# Patient Record
Sex: Male | Born: 1935 | Race: White | Hispanic: No | Marital: Married | Smoking: Never smoker
Health system: Southern US, Community
[De-identification: ages and names within clinical notes are randomized; demographics above are authoritative.]

## PROBLEM LIST (undated history)

## (undated) DIAGNOSIS — R350 Frequency of micturition: Secondary | ICD-10-CM

## (undated) DIAGNOSIS — E785 Hyperlipidemia, unspecified: Secondary | ICD-10-CM

## (undated) DIAGNOSIS — R32 Unspecified urinary incontinence: Secondary | ICD-10-CM

## (undated) DIAGNOSIS — N401 Enlarged prostate with lower urinary tract symptoms: Secondary | ICD-10-CM

## (undated) DIAGNOSIS — I10 Essential (primary) hypertension: Secondary | ICD-10-CM

## (undated) DIAGNOSIS — E669 Obesity, unspecified: Secondary | ICD-10-CM

## (undated) DIAGNOSIS — M1990 Unspecified osteoarthritis, unspecified site: Secondary | ICD-10-CM

## (undated) DIAGNOSIS — K219 Gastro-esophageal reflux disease without esophagitis: Secondary | ICD-10-CM

## (undated) DIAGNOSIS — N138 Other obstructive and reflux uropathy: Secondary | ICD-10-CM

## (undated) DIAGNOSIS — J189 Pneumonia, unspecified organism: Secondary | ICD-10-CM

## (undated) DIAGNOSIS — G4730 Sleep apnea, unspecified: Secondary | ICD-10-CM

## (undated) HISTORY — DX: Gastro-esophageal reflux disease without esophagitis: K21.9

## (undated) HISTORY — DX: Essential (primary) hypertension: I10

## (undated) HISTORY — PX: HERNIA REPAIR: SHX51

## (undated) HISTORY — DX: Unspecified urinary incontinence: R32

## (undated) HISTORY — DX: Hyperlipidemia, unspecified: E78.5

## (undated) HISTORY — DX: Frequency of micturition: R35.0

## (undated) HISTORY — DX: Unspecified osteoarthritis, unspecified site: M19.90

## (undated) HISTORY — DX: Benign prostatic hyperplasia with lower urinary tract symptoms: N40.1

## (undated) HISTORY — DX: Pneumonia, unspecified organism: J18.9

## (undated) HISTORY — DX: Obesity, unspecified: E66.9

## (undated) HISTORY — DX: Other obstructive and reflux uropathy: N13.8

---

## 2003-10-25 ENCOUNTER — Ambulatory Visit (HOSPITAL_COMMUNITY): Admission: RE | Admit: 2003-10-25 | Discharge: 2003-10-25 | Payer: Self-pay | Admitting: Otolaryngology

## 2004-03-06 ENCOUNTER — Ambulatory Visit (HOSPITAL_COMMUNITY): Admission: RE | Admit: 2004-03-06 | Discharge: 2004-03-06 | Payer: Self-pay | Admitting: Otolaryngology

## 2006-02-02 ENCOUNTER — Ambulatory Visit: Payer: Self-pay | Admitting: Gastroenterology

## 2007-10-23 LAB — HM COLONOSCOPY: HM Colonoscopy: NORMAL

## 2014-06-03 DIAGNOSIS — N400 Enlarged prostate without lower urinary tract symptoms: Secondary | ICD-10-CM | POA: Insufficient documentation

## 2014-06-03 DIAGNOSIS — E785 Hyperlipidemia, unspecified: Secondary | ICD-10-CM | POA: Insufficient documentation

## 2014-10-22 ENCOUNTER — Encounter: Payer: Self-pay | Admitting: Internal Medicine

## 2014-10-22 ENCOUNTER — Ambulatory Visit (INDEPENDENT_AMBULATORY_CARE_PROVIDER_SITE_OTHER): Payer: Commercial Managed Care - HMO | Admitting: Internal Medicine

## 2014-10-22 ENCOUNTER — Ambulatory Visit (INDEPENDENT_AMBULATORY_CARE_PROVIDER_SITE_OTHER)
Admission: RE | Admit: 2014-10-22 | Discharge: 2014-10-22 | Disposition: A | Discharge status: Home / Self Care | Payer: Commercial Managed Care - HMO | Source: Ambulatory Visit | Attending: Internal Medicine | Admitting: Internal Medicine

## 2014-10-22 ENCOUNTER — Encounter (INDEPENDENT_AMBULATORY_CARE_PROVIDER_SITE_OTHER): Payer: Self-pay

## 2014-10-22 VITALS — BP 140/62 | HR 68 | Temp 97.6°F | Ht 72.0 in | Wt 229.0 lb

## 2014-10-22 DIAGNOSIS — R059 Cough, unspecified: Secondary | ICD-10-CM | POA: Insufficient documentation

## 2014-10-22 DIAGNOSIS — R42 Dizziness and giddiness: Secondary | ICD-10-CM

## 2014-10-22 DIAGNOSIS — E785 Hyperlipidemia, unspecified: Secondary | ICD-10-CM

## 2014-10-22 DIAGNOSIS — Z1283 Encounter for screening for malignant neoplasm of skin: Secondary | ICD-10-CM

## 2014-10-22 DIAGNOSIS — Z23 Encounter for immunization: Secondary | ICD-10-CM

## 2014-10-22 DIAGNOSIS — R05 Cough: Secondary | ICD-10-CM

## 2014-10-22 LAB — LIPID PANEL
Cholesterol: 226 mg/dL — ABNORMAL HIGH (ref 0–200)
HDL: 47.8 mg/dL (ref >39.00)
LDL Cholesterol: 153 mg/dL — ABNORMAL HIGH (ref 0–99)
NonHDL: 178.2
Total CHOL/HDL Ratio: 5
Triglycerides: 127 mg/dL (ref 0.0–149.0)
VLDL: 25.4 mg/dL (ref 0.0–40.0)

## 2014-10-22 LAB — MICROALBUMIN / CREATININE URINE RATIO
Creatinine,U: 176.3 mg/dL
Microalb Creat Ratio: 0.4 mg/g (ref 0.0–30.0)
Microalb, Ur: 0.7 mg/dL (ref 0.0–1.9)

## 2014-10-22 LAB — CBC WITH DIFFERENTIAL/PLATELET
Basophils Absolute: 0 10*3/uL (ref 0.0–0.1)
Basophils Relative: 0.4 % (ref 0.0–3.0)
Eosinophils Absolute: 0.1 10*3/uL (ref 0.0–0.7)
Eosinophils Relative: 2 % (ref 0.0–5.0)
HCT: 44.2 % (ref 39.0–52.0)
Hemoglobin: 14.6 g/dL (ref 13.0–17.0)
Lymphocytes Relative: 30.9 % (ref 12.0–46.0)
Lymphs Abs: 1.6 10*3/uL (ref 0.7–4.0)
MCHC: 33.1 g/dL (ref 30.0–36.0)
MCV: 94.3 fl (ref 78.0–100.0)
Monocytes Absolute: 0.6 10*3/uL (ref 0.1–1.0)
Monocytes Relative: 11.1 % (ref 3.0–12.0)
Neutro Abs: 2.8 10*3/uL (ref 1.4–7.7)
Neutrophils Relative %: 55.6 % (ref 43.0–77.0)
Platelets: 194 10*3/uL (ref 150.0–400.0)
RBC: 4.68 Mil/uL (ref 4.22–5.81)
RDW: 13.3 % (ref 11.5–15.5)
WBC: 5.1 10*3/uL (ref 4.0–10.5)

## 2014-10-22 LAB — COMPREHENSIVE METABOLIC PANEL
ALT: 21 U/L (ref 0–53)
AST: 24 U/L (ref 0–37)
Albumin: 3.5 g/dL (ref 3.5–5.2)
Alkaline Phosphatase: 68 U/L (ref 39–117)
BUN: 18 mg/dL (ref 6–23)
CO2: 25 mEq/L (ref 19–32)
Calcium: 8.8 mg/dL (ref 8.4–10.5)
Chloride: 106 mEq/L (ref 96–112)
Creatinine, Ser: 0.9 mg/dL (ref 0.4–1.5)
GFR: 82.8 mL/min (ref >60.00)
Glucose, Bld: 88 mg/dL (ref 70–99)
Potassium: 4.2 mEq/L (ref 3.5–5.1)
Sodium: 138 mEq/L (ref 135–145)
Total Bilirubin: 0.9 mg/dL (ref 0.2–1.2)
Total Protein: 6.6 g/dL (ref 6.0–8.3)

## 2014-10-22 LAB — TSH: TSH: 1.64 u[IU]/mL (ref 0.35–4.50)

## 2014-10-22 LAB — VITAMIN B12: Vitamin B-12: 1182 pg/mL — ABNORMAL HIGH (ref 211–911)

## 2014-10-22 NOTE — Assessment & Plan Note (Signed)
Recent dry cough noted by wife. Exam normal today. Will get CXR for further evaluation.

## 2014-10-22 NOTE — Progress Notes (Signed)
Pre visit review using our clinic review tool, if applicable. No additional management support is needed unless otherwise documented below in the visit note. 

## 2014-10-22 NOTE — Assessment & Plan Note (Signed)
Recent episodes of lightheadedness. Exam normal today. Will check CBC, CMP, TSH, B12, carotid doppler. Will request notes from his previous physician. EKG later this week when EPIC interface available.

## 2014-10-22 NOTE — Progress Notes (Signed)
Subjective:    Patient ID: Brian Pearson, male    DOB: [birth year], 78 y.o.   MRN: [id number]  HPI 78YO male presents to establish care.  Lightheaded - Over last several weeks. Occurs mostly with exercise. No chest pain or dyspnea. No syncopal episodes. Also occasionally occurs with change in position. Denies vertigo. No focal neurologic symptoms. No recent changes to medications.  Notes decreased energy level. No change in appetite, BM.  Told by previous physician he had a goiter. No previous work up for this.  Wife also notes him having dry cough frequently. He denies shortness of breath, chest pain. He was hospitalized in distant past for pneumonia.  BP running near 130/60. Has not been taking any medication for BP.  Reports he was taken off his cholesterol medication, as cholesterol was low.  Review of Systems  Constitutional: Positive for fatigue. Negative for fever, chills, activity change, appetite change and unexpected weight change.  HENT: Negative for sore throat and trouble swallowing.   Eyes: Negative for visual disturbance.  Respiratory: Negative for cough, shortness of breath and wheezing.   Cardiovascular: Negative for chest pain, palpitations and leg swelling.  Gastrointestinal: Negative for nausea, vomiting, abdominal pain, diarrhea, constipation, blood in stool and abdominal distention.  Genitourinary: Negative for dysuria, urgency and difficulty urinating.  Musculoskeletal: Negative for myalgias, arthralgias and gait problem.  Skin: Negative for color change and rash.  Neurological: Positive for dizziness and light-headedness. Negative for tremors, seizures, syncope, facial asymmetry, weakness, numbness and headaches.  Hematological: Negative for adenopathy.  Psychiatric/Behavioral: Negative for sleep disturbance and dysphoric mood. The patient is not nervous/anxious.        Objective:    BP 140/62 mmHg  Pulse 68  Temp(Src) 97.6 F (36.4 C) (Oral)  Ht  6' (1.829 m)  Wt 229 lb (103.874 kg)  BMI 31.05 kg/m2  SpO2 95% Physical Exam  Constitutional: He is oriented to person, place, and time. He appears well-developed and well-nourished. No distress.  HENT:  Head: Normocephalic and atraumatic.  Right Ear: External ear normal.  Left Ear: External ear normal.  Nose: Nose normal.  Mouth/Throat: Oropharynx is clear and moist. No oropharyngeal exudate.  Eyes: Conjunctivae and EOM are normal. Pupils are equal, round, and reactive to light. Right eye exhibits no discharge. Left eye exhibits no discharge. No scleral icterus.  Neck: Normal range of motion. Neck supple. No tracheal deviation present. No thyromegaly present.  Cardiovascular: Normal rate, regular rhythm and normal heart sounds.  Exam reveals no gallop and no friction rub.   No murmur heard. Pulmonary/Chest: Effort normal and breath sounds normal. No accessory muscle usage. No tachypnea. No respiratory distress. He has no decreased breath sounds. He has no wheezes. He has no rhonchi. He has no rales. He exhibits no tenderness.  Abdominal: Soft. Bowel sounds are normal. He exhibits no distension and no mass. There is no tenderness. There is no rebound and no guarding.  Musculoskeletal: Normal range of motion. He exhibits no edema.  Lymphadenopathy:    He has no cervical adenopathy.  Neurological: He is alert and oriented to person, place, and time. No cranial nerve deficit. Coordination normal.  Skin: Skin is warm and dry. No rash noted. He is not diaphoretic. No erythema. No pallor.  Psychiatric: He has a normal mood and affect. His behavior is normal. Judgment and thought content normal.          Assessment & Plan:   Problem List Items Addressed This Visit  Unprioritized   Cough    Recent dry cough noted by wife. Exam normal today. Will get CXR for further evaluation.    Relevant Orders      DG Chest 2 View   Hyperlipidemia    Will recheck lipids and LFTs with labs  today.    Relevant Medications      aspirin EC 81 MG tablet      terazosin (HYTRIN) 1 MG capsule   Other Relevant Orders      Lipid panel      Microalbumin / creatinine urine ratio   Lightheaded - Primary    Recent episodes of lightheadedness. Exam normal today. Will check CBC, CMP, TSH, B12, carotid doppler. Will request notes from his previous physician. EKG later this week when EPIC interface available.    Relevant Orders      CBC with Differential      Comprehensive metabolic panel      TSH      B12      US Carotid Duplex Bilateral   Screening for skin cancer    Referral placed for general skin exam with Dr. Phillip Heal.    Relevant Orders      Ambulatory referral to Dermatology    Other Visit Diagnoses    Need for vaccination with 13-polyvalent pneumococcal conjugate vaccine        Relevant Orders       Pneumococcal conjugate vaccine 13-valent (Completed)        Return in about 2 weeks (around 11/05/2014).

## 2014-10-22 NOTE — Patient Instructions (Signed)
We will check lab work today to help determine why you have been feeling lightheaded.  We will also get a chest xray today at our [facility] office.  We will set up an evaluation with carotid dopplers at the hospital.  Please follow up here in 2 weeks.

## 2014-10-22 NOTE — Assessment & Plan Note (Signed)
Referral placed for general skin exam with Dr. Phillip Heal.

## 2014-10-22 NOTE — Assessment & Plan Note (Signed)
Will recheck lipids and LFTs with labs today.

## 2014-10-24 ENCOUNTER — Ambulatory Visit: Payer: Self-pay | Admitting: Internal Medicine

## 2014-10-28 ENCOUNTER — Telehealth: Payer: Self-pay | Admitting: Internal Medicine

## 2014-10-28 NOTE — Telephone Encounter (Signed)
Notified pt.  When should we have pt come in for EKG?

## 2014-10-28 NOTE — Telephone Encounter (Signed)
Any time is fine.

## 2014-10-28 NOTE — Telephone Encounter (Signed)
Carotid doppler showed no significant stenosis.

## 2014-10-29 NOTE — Telephone Encounter (Signed)
appt scheduled

## 2014-10-30 ENCOUNTER — Ambulatory Visit (INDEPENDENT_AMBULATORY_CARE_PROVIDER_SITE_OTHER): Payer: Commercial Managed Care - HMO | Admitting: *Deleted

## 2014-10-30 DIAGNOSIS — R42 Dizziness and giddiness: Secondary | ICD-10-CM

## 2014-10-30 NOTE — Progress Notes (Signed)
Pt presents for EKG, was unable to perform at [place] last week due to technical difficulties. Pt having episodes of lightheadedness, no changes from when he saw Dr. Gilford Rile last week. No new symptoms. Performed EKG without difficulties, pt tolerated well. Dr. Gilford Rile made aware of EKG prior to patient leaving.

## 2014-11-21 ENCOUNTER — Encounter: Payer: Self-pay | Admitting: Internal Medicine

## 2014-11-21 ENCOUNTER — Ambulatory Visit (INDEPENDENT_AMBULATORY_CARE_PROVIDER_SITE_OTHER): Payer: Commercial Managed Care - HMO | Admitting: Internal Medicine

## 2014-11-21 VITALS — BP 149/78 | HR 74 | Temp 97.6°F | Ht 72.0 in | Wt 231.0 lb

## 2014-11-21 DIAGNOSIS — R42 Dizziness and giddiness: Secondary | ICD-10-CM

## 2014-11-21 DIAGNOSIS — E785 Hyperlipidemia, unspecified: Secondary | ICD-10-CM

## 2014-11-21 DIAGNOSIS — R05 Cough: Secondary | ICD-10-CM

## 2014-11-21 DIAGNOSIS — R059 Cough, unspecified: Secondary | ICD-10-CM

## 2014-11-21 MED ORDER — SIMVASTATIN 20 MG PO TABS: 20.0000 mg | ORAL_TABLET | Freq: Every day | ORAL | Status: DC

## 2014-11-21 NOTE — Assessment & Plan Note (Signed)
Symptoms have improved. Carotid dopplers were normal. Suspect that seasonal allergies and middle ear effusion contributing to some earlier symptoms. Will monitor.

## 2014-11-21 NOTE — Assessment & Plan Note (Signed)
Chronic cough likely related to seasonal allergies. CXR was normal. Will continue Allegra. We discussed adding nasal steroid and/or Singulair, however he prefers to hold off for now. We also discussed potential referral to pulmonary medicine for evaluation, but he prefers to hold off for now. Follow up as needed.

## 2014-11-21 NOTE — Progress Notes (Signed)
Subjective:    Patient ID: Brian Pearson, male    DOB: [birth year], 78 y.o.   MRN: [id number]  HPI 78YO male presents for follow up.  Recently seen as new pt and complained of lightheadedness and cough. Workup including labs were normal. Carotid dopplers were normal. CXR was normal.  Started on Allegra and symptoms have improved.  Continues to have some cough every day. Occasionally productive of phlegm. No dyspnea. No fever, chills.  BP Readings from Last 3 Encounters:  11/21/14 149/78  10/22/14 140/62   BP at home has been near 702O systolic. No chest pain, headache, palpitations noted.  Review of Systems  Constitutional: Negative for fever, chills, activity change, appetite change, fatigue and unexpected weight change.  Eyes: Negative for visual disturbance.  Respiratory: Positive for cough. Negative for shortness of breath.   Cardiovascular: Negative for chest pain, palpitations and leg swelling.  Gastrointestinal: Negative for nausea, abdominal pain, diarrhea, constipation and abdominal distention.  Genitourinary: Negative for dysuria, urgency and difficulty urinating.  Musculoskeletal: Negative for arthralgias and gait problem.  Skin: Negative for color change and rash.  Neurological: Negative for light-headedness.  Hematological: Negative for adenopathy.  Psychiatric/Behavioral: Negative for sleep disturbance and dysphoric mood. The patient is not nervous/anxious.        Objective:    BP 149/78 mmHg  Pulse 74  Temp(Src) 97.6 F (36.4 C) (Oral)  Ht 6' (1.829 m)  Wt 231 lb (104.781 kg)  BMI 31.32 kg/m2  SpO2 95% Physical Exam  Constitutional: He is oriented to person, place, and time. He appears well-developed and well-nourished. No distress.  HENT:  Head: Normocephalic and atraumatic.  Right Ear: External ear normal.  Left Ear: External ear normal.  Nose: Nose normal.  Mouth/Throat: Oropharynx is clear and moist. No oropharyngeal exudate.  Eyes:  Conjunctivae and EOM are normal. Pupils are equal, round, and reactive to light. Right eye exhibits no discharge. Left eye exhibits no discharge. No scleral icterus.  Neck: Normal range of motion. Neck supple. No tracheal deviation present. No thyromegaly present.  Cardiovascular: Normal rate, regular rhythm and normal heart sounds.  Exam reveals no gallop and no friction rub.   No murmur heard. Pulmonary/Chest: Effort normal and breath sounds normal. No accessory muscle usage. No tachypnea. No respiratory distress. He has no decreased breath sounds. He has no wheezes. He has no rhonchi. He has no rales. He exhibits no tenderness.  Musculoskeletal: Normal range of motion. He exhibits no edema.  Lymphadenopathy:    He has no cervical adenopathy.  Neurological: He is alert and oriented to person, place, and time. No cranial nerve deficit. Coordination normal.  Skin: Skin is warm and dry. No rash noted. He is not diaphoretic. No erythema. No pallor.  Psychiatric: He has a normal mood and affect. His behavior is normal. Judgment and thought content normal.          Assessment & Plan:   Problem List Items Addressed This Visit      Unprioritized   Cough    Chronic cough likely related to seasonal allergies. CXR was normal. Will continue Allegra. We discussed adding nasal steroid and/or Singulair, however he prefers to hold off for now. We also discussed potential referral to pulmonary medicine for evaluation, but he prefers to hold off for now. Follow up as needed.    Hyperlipidemia    We reviewed recent lipids and calculated 10 year risk of CAD which is 22%. Will restart Simvastatin. Recheck lipids and LFTs in  3 months.    Relevant Medications      simvastatin (ZOCOR) tablet   Lightheaded - Primary    Symptoms have improved. Carotid dopplers were normal. Suspect that seasonal allergies and middle ear effusion contributing to some earlier symptoms. Will monitor.        Return in about  3 months (around 02/20/2015) for Recheck.

## 2014-11-21 NOTE — Progress Notes (Signed)
Pre visit review using our clinic review tool, if applicable. No additional management support is needed unless otherwise documented below in the visit note. 

## 2014-11-21 NOTE — Assessment & Plan Note (Signed)
We reviewed recent lipids and calculated 10 year risk of CAD which is 22%. Will restart Simvastatin. Recheck lipids and LFTs in 3 months.

## 2014-11-21 NOTE — Patient Instructions (Signed)
Start back on Simvastatin 20mg  daily.  Recheck labs in 3 months.

## 2014-11-22 ENCOUNTER — Encounter: Payer: Self-pay | Admitting: Internal Medicine

## 2014-12-03 ENCOUNTER — Encounter: Payer: Self-pay | Admitting: Internal Medicine

## 2015-02-21 ENCOUNTER — Encounter: Payer: Self-pay | Admitting: Internal Medicine

## 2015-02-21 ENCOUNTER — Encounter: Payer: Self-pay | Admitting: *Deleted

## 2015-02-21 ENCOUNTER — Ambulatory Visit (INDEPENDENT_AMBULATORY_CARE_PROVIDER_SITE_OTHER): Payer: Commercial Managed Care - HMO | Admitting: Internal Medicine

## 2015-02-21 VITALS — BP 133/73 | HR 73 | Temp 97.5°F | Ht 72.0 in | Wt 230.0 lb

## 2015-02-21 DIAGNOSIS — R3911 Hesitancy of micturition: Secondary | ICD-10-CM | POA: Insufficient documentation

## 2015-02-21 DIAGNOSIS — M25559 Pain in unspecified hip: Secondary | ICD-10-CM

## 2015-02-21 DIAGNOSIS — E785 Hyperlipidemia, unspecified: Secondary | ICD-10-CM

## 2015-02-21 LAB — POCT URINALYSIS DIPSTICK
Bilirubin, UA: NEGATIVE
Blood, UA: NEGATIVE
Glucose, UA: NEGATIVE
Ketones, UA: NEGATIVE
Leukocytes, UA: NEGATIVE
Nitrite, UA: NEGATIVE
Protein, UA: NEGATIVE
Spec Grav, UA: 1.015
Urobilinogen, UA: 0.2
pH, UA: 6.5

## 2015-02-21 LAB — COMPREHENSIVE METABOLIC PANEL
ALT: 19 U/L (ref 0–53)
AST: 21 U/L (ref 0–37)
Albumin: 4.5 g/dL (ref 3.5–5.2)
Alkaline Phosphatase: 80 U/L (ref 39–117)
BUN: 16 mg/dL (ref 6–23)
CO2: 28 mEq/L (ref 19–32)
Calcium: 9.4 mg/dL (ref 8.4–10.5)
Chloride: 106 mEq/L (ref 96–112)
Creatinine, Ser: 1.03 mg/dL (ref 0.40–1.50)
GFR: 74.44 mL/min (ref >60.00)
Glucose, Bld: 103 mg/dL — ABNORMAL HIGH (ref 70–99)
Potassium: 4.4 mEq/L (ref 3.5–5.1)
Sodium: 139 mEq/L (ref 135–145)
Total Bilirubin: 0.9 mg/dL (ref 0.2–1.2)
Total Protein: 6.9 g/dL (ref 6.0–8.3)

## 2015-02-21 LAB — LIPID PANEL
Cholesterol: 143 mg/dL (ref 0–200)
HDL: 49.9 mg/dL (ref >39.00)
LDL Cholesterol: 63 mg/dL (ref 0–99)
NonHDL: 93.1
Total CHOL/HDL Ratio: 3
Triglycerides: 153 mg/dL — ABNORMAL HIGH (ref 0.0–149.0)
VLDL: 30.6 mg/dL (ref 0.0–40.0)

## 2015-02-21 LAB — PSA, MEDICARE: PSA: 2.5 ng/ml (ref 0.10–4.00)

## 2015-02-21 NOTE — Patient Instructions (Addendum)
Labs today.  Consider starting core exercises to help with low back pain, such as performing a plank.  Follow up in 6 months for Wellness Visit or sooner as needed.

## 2015-02-21 NOTE — Assessment & Plan Note (Signed)
Long history of urinary frequency and hesitancy. Diagnosed in past with BPH and started on Terazosin. No recent PSA on chart from previous PCP. Will recheck PSA with labs. Will also set up urology evaluation. With ongoing symptoms and history of prostate cancer in his father, question if [place] might be helpful.

## 2015-02-21 NOTE — Assessment & Plan Note (Addendum)
Mild aching pain in posterior thighs after prolonged sitting. Discussed potential causes. Recommended frequent standing, moving to help control pain. Discussed referral to PT, however he declines for now. Discussed core exercises. Follow up if symptoms are not improving.

## 2015-02-21 NOTE — Progress Notes (Signed)
Pre visit review using our clinic review tool, if applicable. No additional management support is needed unless otherwise documented below in the visit note. 

## 2015-02-21 NOTE — Assessment & Plan Note (Signed)
Recently started back on Simvastatin. Will recheck lipids and LFTs with labs today.

## 2015-02-21 NOTE — Progress Notes (Signed)
Subjective:    Patient ID: Brian Pearson, male    DOB: 04-02-1936, 79 y.o.   MRN: [id number]  HPI  79YO male presents for follow up.  Last seen 12/3 for lightheadedness. Evaluation was normal including carotid dopplers. Symptoms had improved at the time of that visit.  Thigh pain - Some posterior thigh pain over last few weeks with prolonged sitting. Described as aching. Improves with standing and moving around. No swelling in legs. No weakness. Some lower back pain, aching, off and on for years. Occasionally takes Tylenol with improvement.  Urinary frequency - waking 1-3 times per night to urinate. Some decreased urine stream. This has been worsening over the last year. Previously placed on Terazosin for enlarged prostate. No pain with urination or blood in urine.  Exercising about 24min per day on bike. Follows healthy diet.  Wt Readings from Last 3 Encounters:  02/21/15 230 lb (104.327 kg)  11/21/14 231 lb (104.781 kg)  10/22/14 229 lb (103.874 kg)     Past medical, surgical, family and social history per today's encounter.  Review of Systems  Constitutional: Negative for fever, chills, activity change, appetite change, fatigue and unexpected weight change.  Eyes: Negative for visual disturbance.  Respiratory: Negative for cough and shortness of breath.   Cardiovascular: Negative for chest pain, palpitations and leg swelling.  Gastrointestinal: Negative for abdominal pain and abdominal distention.  Genitourinary: Positive for frequency and difficulty urinating (decreased stream). Negative for dysuria, urgency, hematuria, flank pain, penile swelling, scrotal swelling, penile pain and testicular pain.  Musculoskeletal: Positive for myalgias (posterior thighs, intermittent) and back pain (low back pain occasional). Negative for arthralgias and gait problem.  Skin: Negative for color change and rash.  Hematological: Negative for adenopathy.  Psychiatric/Behavioral: Negative  for sleep disturbance and dysphoric mood. The patient is not nervous/anxious.        Objective:    BP 133/73 mmHg  Pulse 73  Temp(Src) 97.5 F (36.4 C) (Oral)  Ht 6' (1.829 m)  Wt 230 lb (104.327 kg)  BMI 31.19 kg/m2  SpO2 96% Physical Exam  Constitutional: He is oriented to person, place, and time. He appears well-developed and well-nourished. No distress.  HENT:  Head: Normocephalic and atraumatic.  Right Ear: External ear normal.  Left Ear: External ear normal.  Nose: Nose normal.  Mouth/Throat: Oropharynx is clear and moist. No oropharyngeal exudate.  Eyes: Conjunctivae and EOM are normal. Pupils are equal, round, and reactive to light. Right eye exhibits no discharge. Left eye exhibits no discharge. No scleral icterus.  Neck: Normal range of motion. Neck supple. No tracheal deviation present. No thyromegaly present.  Cardiovascular: Normal rate, regular rhythm and normal heart sounds.  Exam reveals no gallop and no friction rub.   No murmur heard. Pulmonary/Chest: Effort normal and breath sounds normal. No accessory muscle usage. No tachypnea. No respiratory distress. He has no decreased breath sounds. He has no wheezes. He has no rhonchi. He has no rales. He exhibits no tenderness.  Musculoskeletal: Normal range of motion. He exhibits no edema.       Lumbar back: He exhibits normal range of motion, no tenderness and no pain.  Lymphadenopathy:    He has no cervical adenopathy.  Neurological: He is alert and oriented to person, place, and time. No cranial nerve deficit. Coordination normal.  Skin: Skin is warm and dry. No rash noted. He is not diaphoretic. No erythema. No pallor.  Psychiatric: He has a normal mood and affect. His behavior is  normal. Judgment and thought content normal.          Assessment & Plan:   Problem List Items Addressed This Visit      Unprioritized   Hyperlipidemia    Recently started back on Simvastatin. Will recheck lipids and LFTs with  labs today.      Relevant Orders   Comprehensive metabolic panel   Lipid panel   Pain in joint, pelvic region and thigh - Primary    Mild aching pain in posterior thighs after prolonged sitting. Discussed potential causes. Recommended frequent standing, moving to help control pain. Discussed referral to PT, however he declines for now. Discussed core exercises. Follow up if symptoms are not improving.      Urinary hesitancy    Long history of urinary frequency and hesitancy. Diagnosed in past with BPH and started on Terazosin. No recent PSA on chart from previous PCP. Will recheck PSA with labs. Will also set up urology evaluation. With ongoing symptoms and history of prostate cancer in his father, question if [place] might be helpful.      Relevant Orders   PSA, Medicare   POCT Urinalysis Dipstick   Ambulatory referral to Urology       Return in about 6 months (around 08/24/2015) for Wellness Visit.

## 2015-03-11 ENCOUNTER — Telehealth: Payer: Self-pay | Admitting: Internal Medicine

## 2015-03-11 NOTE — Telephone Encounter (Signed)
Lab sent to [facility]

## 2015-03-11 NOTE — Telephone Encounter (Signed)
[facility] Pearson [facility] call she need labs on patient, psa results,  Phone # [phone]  Fax # [phone]

## 2015-08-28 ENCOUNTER — Ambulatory Visit (INDEPENDENT_AMBULATORY_CARE_PROVIDER_SITE_OTHER): Payer: Commercial Managed Care - HMO

## 2015-08-28 VITALS — BP 130/72 | HR 73 | Temp 98.1°F | Resp 14 | Ht 73.0 in | Wt 233.0 lb

## 2015-08-28 DIAGNOSIS — Z0000 Encounter for general adult medical examination without abnormal findings: Secondary | ICD-10-CM | POA: Diagnosis not present

## 2015-08-28 DIAGNOSIS — Z23 Encounter for immunization: Secondary | ICD-10-CM

## 2015-08-28 DIAGNOSIS — Z299 Encounter for prophylactic measures, unspecified: Secondary | ICD-10-CM

## 2015-08-28 NOTE — Patient Instructions (Signed)
Brian Pearson,  Thank you for taking time to come for your Medicare Wellness Visit.  I appreciate your ongoing commitment to your health goals. Please review the following plan we discussed and let me know if I can assist you in the future.  Bring a copy of HCPOA/Living Will to be scanned into chart.  TDAP (tetanus with pertussis) administered today.  High dose influenza vaccine (flu shot) administered today.

## 2015-08-28 NOTE — Progress Notes (Signed)
Subjective:   Brian Pearson is a 79 y.o. male who presents for Medicare Annual/Subsequent preventive examination.  Review of Systems:  No ROS.  Medicare Wellness Visit.  Cardiac Risk Factors include: advanced age (>76men, >32 women);male gender     Objective:    The goal of the wellness visit is to assist the patient how to close the gaps in care and create a preventative care plan for the patient. This was a routine visit for Brian Pearson.  Vitals: BP 130/72 mmHg  Pulse 73  Temp(Src) 98.1 F (36.7 C) (Oral)  Resp 14  Ht 6\' 1"  (1.854 m)  Wt 233 lb (105.688 kg)  BMI 30.75 kg/m2  SpO2 96%  Tobacco History  Smoking status  . Never Smoker   Smokeless tobacco  . Not on file     Counseling given: Not Answered   Past Medical History  Diagnosis Date  . Arthritis   . GERD (gastroesophageal reflux disease)   . Hypertension   . Urine incontinence   . Hyperlipidemia     taken off simvastatin last year  . Pneumonia     hospitalized    Past Surgical History  Procedure Laterality Date  . Hernia repair      59's - 3 surgeries   Family History  Problem Relation Age of Onset  . Arthritis Mother   . Heart disease Mother     CHF  . Cancer Father     Prostate  . Dementia Father    History  Sexual Activity  . Sexual Activity: Yes    Outpatient Encounter Prescriptions as of 08/28/2015  Medication Sig  . finasteride (PROSCAR) 5 MG tablet Take 5 mg by mouth daily.  . tamsulosin (FLOMAX) 0.4 MG CAPS capsule Take 0.4 mg by mouth.  Marland Kitchen aspirin EC 81 MG tablet Take by mouth.  . Coenzyme Q10 (COQ10 PO) Take by mouth.  . Cyanocobalamin (B-12 PO) Take by mouth.  . Multiple Vitamin (MULTI-VITAMINS) TABS Take by mouth.  . Omega-3 Fatty Acids (FISH OIL) 1000 MG CAPS Take by mouth.  . simvastatin (ZOCOR) 20 MG tablet Take 1 tablet (20 mg total) by mouth at bedtime.  . [DISCONTINUED] terazosin (HYTRIN) 1 MG capsule TAKE 2 CAPSULES EVERY MORNING   No facility-administered  encounter medications on file as of 08/28/2015.    Activities of Daily Living In your present state of health, do you have any difficulty performing the following activities: 08/28/2015  Hearing? Y  Vision? N  Difficulty concentrating or making decisions? N  Walking or climbing stairs? N  Dressing or bathing? N  Doing errands, shopping? N  Preparing Food and eating ? N  Using the Toilet? N  In the past six months, have you accidently leaked urine? N  Do you have problems with loss of bowel control? N  Managing your Medications? N  Managing your Finances? N  Housekeeping or managing your Housekeeping? N    Patient Care Team: Jackolyn Confer, MD as PCP - General (Internal Medicine)   Assessment:     Risk for Osteoporosis reviewed  Taking meds without issues; no barriers identified.  C/O bladder urgency.  Denies pain. Followed by [facility], Dr. Zara Council. Currently taking Finasteride 5mg , Tamsulosin 0.4mg .  Safety issues reviewed; smoke detectors in the home. No firearms in the home. Wears seatbelts when driving or riding with others. No violence in the home.  The patient was oriented x 3; appropriate in dress and manner and no objective failures at  ADL's or IADL's.   Ophthalmologist- Followed by [facility].Last visit June 2016.   Bilateral hearing Aids-Followed by [facility] visit 2 years ago.  No complaints. Passes whisper test.  End of life planning was discussed; Advanced care planning discussed; plans to return copy of HCPOA and Living Will.  High dose Influenza administered today.  TDAP administered today.  ZOSTAVAX reported as done by the patient.  Record updated.  Patient reported a feeling of pull behind left thigh when legs are raised in recliner and while driving.  Denies pain. Deferred to follow up with PCP at 3 mo follow up or sooner if needed.  Exercise Activities and Dietary recommendations Current  Exercise Habits:: Home exercise routine (Uses stretch bands and rides bike), Time (Minutes): 30, Frequency (Times/Week): 4, Weekly Exercise (Minutes/Week): 120, Intensity: Mild  Goals    . Increase physical activity     Incorporate walking with wife 30 minutes up to 4 days a week.  Drink more water. Currently drinking 1 (16oz)  bottle per day.  Will try to drink up to 4 bottles per day.      Fall Risk Fall Risk  08/28/2015 02/21/2015  Falls in the past year? No No   Depression Screen PHQ 2/9 Scores 08/28/2015 02/21/2015  PHQ - 2 Score 0 0    Cognitive Testing MMSE - Mini Mental State Exam 08/28/2015  Orientation to time 5  Orientation to Place 5  Registration 3  Attention/ Calculation 5  Recall 3  Language- name 2 objects 2  Language- repeat 1  Language- follow 3 step command 3  Language- read & follow direction 1  Write a sentence 1  Copy design 1  Total score 30    Immunization History  Administered Date(s) Administered  . Influenza, High Dose Seasonal PF 08/28/2015  . Influenza-Unspecified 09/21/2014  . Pneumococcal Conjugate-13 10/22/2014  . Pneumococcal-Unspecified 10/23/2011  . Tdap 08/28/2015   Screening Tests Health Maintenance  Topic Date Due  . ZOSTAVAX  03/16/1998  . INFLUENZA VACCINE  07/20/2016  . COLONOSCOPY  10/22/2017  . TETANUS/TDAP  08/27/2025  . PNA vac Low Risk Adult  Completed      Plan:    During the course of the visit the patient was educated and counseled about the following appropriate screening and preventive services:   Vaccines to include Pneumoccal, Influenza, Hepatitis B, Td, Zostavax, HCV  Electrocardiogram  Cardiovascular Disease  Colorectal cancer screening  Diabetes screening  Prostate Cancer Screening  Glaucoma screening  Nutrition counseling   Smoking cessation counseling  Patient Instructions (the written plan) was given to the patient.    Varney Biles, LPN  0/05/44

## 2015-08-28 NOTE — Progress Notes (Signed)
Annual Wellness Visit as completed by Health Coach was reviewed in full.  

## 2015-11-27 ENCOUNTER — Ambulatory Visit: Payer: Commercial Managed Care - HMO | Admitting: Internal Medicine

## 2015-12-12 ENCOUNTER — Ambulatory Visit: Payer: Commercial Managed Care - HMO | Admitting: Internal Medicine

## 2015-12-30 DIAGNOSIS — L570 Actinic keratosis: Secondary | ICD-10-CM | POA: Diagnosis not present

## 2015-12-30 DIAGNOSIS — Z872 Personal history of diseases of the skin and subcutaneous tissue: Secondary | ICD-10-CM | POA: Diagnosis not present

## 2015-12-30 DIAGNOSIS — Z1283 Encounter for screening for malignant neoplasm of skin: Secondary | ICD-10-CM | POA: Diagnosis not present

## 2016-01-02 ENCOUNTER — Ambulatory Visit (INDEPENDENT_AMBULATORY_CARE_PROVIDER_SITE_OTHER): Payer: PPO | Admitting: Internal Medicine

## 2016-01-02 ENCOUNTER — Encounter: Payer: Self-pay | Admitting: Internal Medicine

## 2016-01-02 VITALS — BP 180/82 | HR 61 | Temp 97.5°F | Ht 73.0 in | Wt 225.2 lb

## 2016-01-02 DIAGNOSIS — R1310 Dysphagia, unspecified: Secondary | ICD-10-CM

## 2016-01-02 DIAGNOSIS — IMO0001 Reserved for inherently not codable concepts without codable children: Secondary | ICD-10-CM

## 2016-01-02 DIAGNOSIS — R42 Dizziness and giddiness: Secondary | ICD-10-CM | POA: Insufficient documentation

## 2016-01-02 DIAGNOSIS — R030 Elevated blood-pressure reading, without diagnosis of hypertension: Secondary | ICD-10-CM

## 2016-01-02 DIAGNOSIS — E785 Hyperlipidemia, unspecified: Secondary | ICD-10-CM

## 2016-01-02 DIAGNOSIS — R252 Cramp and spasm: Secondary | ICD-10-CM

## 2016-01-02 DIAGNOSIS — I10 Essential (primary) hypertension: Secondary | ICD-10-CM | POA: Insufficient documentation

## 2016-01-02 LAB — COMPREHENSIVE METABOLIC PANEL
ALT: 19 U/L (ref 0–53)
AST: 22 U/L (ref 0–37)
Albumin: 4.3 g/dL (ref 3.5–5.2)
Alkaline Phosphatase: 70 U/L (ref 39–117)
BUN: 16 mg/dL (ref 6–23)
CO2: 28 mEq/L (ref 19–32)
Calcium: 9.3 mg/dL (ref 8.4–10.5)
Chloride: 105 mEq/L (ref 96–112)
Creatinine, Ser: 0.93 mg/dL (ref 0.40–1.50)
GFR: 83.56 mL/min (ref >60.00)
Glucose, Bld: 93 mg/dL (ref 70–99)
Potassium: 4.3 mEq/L (ref 3.5–5.1)
Sodium: 139 mEq/L (ref 135–145)
Total Bilirubin: 0.9 mg/dL (ref 0.2–1.2)
Total Protein: 6.7 g/dL (ref 6.0–8.3)

## 2016-01-02 LAB — LIPID PANEL
Cholesterol: 142 mg/dL (ref 0–200)
HDL: 55 mg/dL (ref >39.00)
LDL Cholesterol: 62 mg/dL (ref 0–99)
NonHDL: 86.86
Total CHOL/HDL Ratio: 3
Triglycerides: 123 mg/dL (ref 0.0–149.0)
VLDL: 24.6 mg/dL (ref 0.0–40.0)

## 2016-01-02 LAB — VITAMIN B12: Vitamin B-12: 1198 pg/mL — ABNORMAL HIGH (ref 211–911)

## 2016-01-02 LAB — CBC WITH DIFFERENTIAL/PLATELET
Basophils Absolute: 0 10*3/uL (ref 0.0–0.1)
Basophils Relative: 0.5 % (ref 0.0–3.0)
Eosinophils Absolute: 0.1 10*3/uL (ref 0.0–0.7)
Eosinophils Relative: 1.8 % (ref 0.0–5.0)
HCT: 45.3 % (ref 39.0–52.0)
Hemoglobin: 14.8 g/dL (ref 13.0–17.0)
Lymphocytes Relative: 34 % (ref 12.0–46.0)
Lymphs Abs: 1.9 10*3/uL (ref 0.7–4.0)
MCHC: 32.6 g/dL (ref 30.0–36.0)
MCV: 94.9 fl (ref 78.0–100.0)
Monocytes Absolute: 0.6 10*3/uL (ref 0.1–1.0)
Monocytes Relative: 11.2 % (ref 3.0–12.0)
Neutro Abs: 3 10*3/uL (ref 1.4–7.7)
Neutrophils Relative %: 52.5 % (ref 43.0–77.0)
Platelets: 207 10*3/uL (ref 150.0–400.0)
RBC: 4.77 Mil/uL (ref 4.22–5.81)
RDW: 13.6 % (ref 11.5–15.5)
WBC: 5.7 10*3/uL (ref 4.0–10.5)

## 2016-01-02 LAB — TSH: TSH: 2.02 u[IU]/mL (ref 0.35–4.50)

## 2016-01-02 LAB — MAGNESIUM: Magnesium: 2.1 mg/dL (ref 1.5–2.5)

## 2016-01-02 MED ORDER — AMLODIPINE BESYLATE 2.5 MG PO TABS: 2.5000 mg | ORAL_TABLET | Freq: Every day | ORAL | Status: DC

## 2016-01-02 MED ORDER — OMEPRAZOLE 20 MG PO CPDR: 20.0000 mg | DELAYED_RELEASE_CAPSULE | Freq: Every day | ORAL | Status: DC

## 2016-01-02 NOTE — Assessment & Plan Note (Signed)
BP Readings from Last 3 Encounters:  01/02/16 180/82  08/28/15 130/72  02/21/15 133/73   BP markedly elevated today. Will start Amlodipine 2.5mg  daily. Recheck BP on Monday. Labs today including renal function.

## 2016-01-02 NOTE — Assessment & Plan Note (Signed)
Will check lipids and LFTs with labs. Continue Simvastatin. 

## 2016-01-02 NOTE — Progress Notes (Signed)
Pre visit review using our clinic review tool, if applicable. No additional management support is needed unless otherwise documented below in the visit note. 

## 2016-01-02 NOTE — Assessment & Plan Note (Signed)
Dysphagia with food caught low in chest. Suspect esophageal narrowing. Will set up GI evaluation for possible endoscopy.

## 2016-01-02 NOTE — Progress Notes (Signed)
Subjective:    Patient ID: Brian Pearson, male    DOB: September 14, 1936, 80 y.o.   MRN: [id number]  HPI  80YO male presents for follow up.  Lightheaded - first thing in the morning over the last week. No recent illnesses. No falls. Checks BP at home, mostly Q000111Q systolic. Exercises 4 times a week on bike with no lightheadedness. Previous carotid dopplers were normal in 2015.  GERD - feels that food is getting stuck in mid chest. Occurs mostly with chicken. Also notes occasional burning in chest. Takes occasional omeprazole with some improvement. No NV. No change in appetite. No previous h/o EGD.  Wt Readings from Last 3 Encounters:  01/02/16 225 lb 4 oz (102.173 kg)  08/28/15 233 lb (105.688 kg)  02/21/15 230 lb (104.327 kg)   BP Readings from Last 3 Encounters:  01/02/16 180/82  08/28/15 130/72  02/21/15 133/73    Past Medical History  Diagnosis Date  . Arthritis   . GERD (gastroesophageal reflux disease)   . Hypertension   . Urine incontinence   . Hyperlipidemia     taken off simvastatin last year  . Pneumonia     hospitalized    Family History  Problem Relation Age of Onset  . Arthritis Mother   . Heart disease Mother     CHF  . Cancer Father     Prostate  . Dementia Father    Past Surgical History  Procedure Laterality Date  . Hernia repair      22's - 3 surgeries   Social History   Social History  . Marital Status: Married    Spouse Name: N/A  . Number of Children: N/A  . Years of Education: N/A   Social History Main Topics  . Smoking status: Never Smoker   . Smokeless tobacco: None  . Alcohol Use: No  . Drug Use: No  . Sexual Activity: Yes   Other Topics Concern  . None   Social History Narrative   Lives in [place].      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  HENT: Positive for trouble swallowing. Negative for congestion, postnasal drip and rhinorrhea.   Eyes: Negative for visual disturbance.  Respiratory: Negative for cough and shortness of breath.   Cardiovascular: Negative for chest pain, palpitations and leg swelling.  Gastrointestinal: Negative for nausea, vomiting, abdominal pain, diarrhea, constipation and abdominal distention.  Genitourinary: Negative for dysuria, urgency and difficulty urinating.  Musculoskeletal: Negative for arthralgias and gait problem.  Skin: Negative for color change and rash.  Neurological: Positive for dizziness and light-headedness. Negative for tremors, syncope, facial asymmetry, weakness, numbness and headaches.  Hematological: Negative for adenopathy.  Psychiatric/Behavioral: Negative for sleep disturbance and dysphoric mood. The patient is not nervous/anxious.        Objective:    BP 180/82 mmHg  Pulse 61  Temp(Src) 97.5 F (36.4 C) (Oral)  Ht 6\' 1"  (1.854 m)  Wt 225 lb 4 oz (102.173 kg)  BMI 29.72 kg/m2  SpO2 97% Physical Exam  Constitutional: He is oriented to person, place, and time. He appears well-developed and well-nourished. No distress.  HENT:  Head: Normocephalic and atraumatic.  Right Ear: External ear normal.  Left Ear: External ear normal.  Nose: Nose normal.  Mouth/Throat: Oropharynx is clear and moist. No oropharyngeal exudate.  Eyes: Conjunctivae and EOM are normal. Pupils are equal, round, and reactive to light. Right eye exhibits no discharge. Left eye exhibits no discharge. No scleral icterus.  Neck: Normal range of motion. Neck supple. No tracheal deviation present. No thyromegaly present.  Cardiovascular: Normal rate, regular rhythm and normal heart sounds.  Exam reveals no gallop and no friction rub.   No murmur heard. Pulmonary/Chest: Effort normal and breath  sounds normal. No accessory muscle usage. No tachypnea. No respiratory distress. He has no decreased breath sounds. He has no wheezes. He has no rhonchi. He has no rales. He exhibits no tenderness.  Musculoskeletal: Normal range of motion. He exhibits no edema.  Lymphadenopathy:    He has no cervical adenopathy.  Neurological: He is alert and oriented to person, place, and time. No cranial nerve deficit. Coordination normal.  Skin: Skin is warm and dry. No rash noted. He is not diaphoretic. No erythema. No pallor.  Psychiatric: He has a normal mood and affect. His behavior is normal. Judgment and thought content normal.          Assessment & Plan:   Problem List Items Addressed This Visit      Unprioritized   Dysphagia    Dysphagia with food caught low in chest. Suspect esophageal narrowing. Will set up GI evaluation for possible endoscopy.      Relevant Medications   omeprazole (PRILOSEC) 20 MG capsule   Other Relevant Orders   Ambulatory referral to Gastroenterology   Elevated BP    BP Readings from Last 3 Encounters:  01/02/16 180/82  08/28/15 130/72  02/21/15 133/73   BP markedly elevated today. Will start Amlodipine 2.5mg  daily. Recheck BP on Monday. Labs today including renal function.      Relevant Medications   amLODipine (NORVASC) 2.5 MG tablet   Other Relevant Orders   EKG 12-Lead (Completed)   Episodic lightheadedness    Episodic lightheadedness. This has been present >1 year. Evaluation last year including labs and carotid dopplers normal. Will repeat labs today. EKG today stable. Follow up on Monday.      Relevant Orders   CBC w/Diff   B12   TSH   US Carotid Bilateral   Hyperlipidemia - Primary    Will check lipids and LFTs with labs. Continue Simvastatin.      Relevant Medications   amLODipine (NORVASC) 2.5 MG tablet   Other Relevant Orders   Comprehensive metabolic panel   Lipid panel   Muscle cramp    Recent muscle cramps in legs. Will check  electrolytes, B12 with labs.      Relevant Orders   Magnesium       Return in about 3 days (around 01/05/2016) for Recheck.

## 2016-01-02 NOTE — Assessment & Plan Note (Signed)
Episodic lightheadedness. This has been present >1 year. Evaluation last year including labs and carotid dopplers normal. Will repeat labs today. EKG today stable. Follow up on Monday.

## 2016-01-02 NOTE — Patient Instructions (Addendum)
We will set up evaluation with a GI specialist for upper endoscopy to evaluate your trouble swallowing.  Please start Amlodipine 2.5mg  daily to help lower blood pressure.  Follow up on Monday to recheck blood pressure.  Labs today.

## 2016-01-02 NOTE — Assessment & Plan Note (Signed)
Recent muscle cramps in legs. Will check electrolytes, B12 with labs.

## 2016-01-06 ENCOUNTER — Encounter: Payer: Self-pay | Admitting: Internal Medicine

## 2016-01-06 ENCOUNTER — Ambulatory Visit (INDEPENDENT_AMBULATORY_CARE_PROVIDER_SITE_OTHER): Payer: PPO | Admitting: Internal Medicine

## 2016-01-06 VITALS — BP 156/76 | HR 67 | Temp 97.6°F | Ht 73.0 in | Wt 226.2 lb

## 2016-01-06 DIAGNOSIS — I10 Essential (primary) hypertension: Secondary | ICD-10-CM

## 2016-01-06 NOTE — Progress Notes (Signed)
Pre visit review using our clinic review tool, if applicable. No additional management support is needed unless otherwise documented below in the visit note. 

## 2016-01-06 NOTE — Progress Notes (Signed)
Subjective:    Patient ID: Brian Pearson, male    DOB: Sep 12, 1936, 80 y.o.   MRN: [id number]  HPI  80YO male presents for follow up.  Recently seen for elevated blood pressure.  BP at home mostly Q000111Q systolic. Tolerating medication well. No light headness. No chest pain, palpitations.  Symptoms of dysphagia have improved some with use of Omeprazole. Referral to GI is pending.   Wt Readings from Last 3 Encounters:  01/06/16 226 lb 4 oz (102.626 kg)  01/02/16 225 lb 4 oz (102.173 kg)  08/28/15 233 lb (105.688 kg)   BP Readings from Last 3 Encounters:  01/06/16 156/76  01/02/16 180/82  08/28/15 130/72    Past Medical History  Diagnosis Date  . Arthritis   . GERD (gastroesophageal reflux disease)   . Hypertension   . Urine incontinence   . Hyperlipidemia     taken off simvastatin last year  . Pneumonia     hospitalized    Family History  Problem Relation Age of Onset  . Arthritis Mother   . Heart disease Mother     CHF  . Cancer Father     Prostate  . Dementia Father    Past Surgical History  Procedure Laterality Date  . Hernia repair      12's - 3 surgeries   Social History   Social History  . Marital Status: Married    Spouse Name: N/A  . Number of Children: N/A  . Years of Education: N/A   Social History Main Topics  . Smoking status: Never Smoker   . Smokeless tobacco: None  . Alcohol Use: No  . Drug Use: No  . Sexual Activity: Yes   Other Topics Concern  . None   Social History Narrative   Lives in [place].      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  Eyes: Negative for visual disturbance.  Respiratory: Negative for cough and shortness of breath.     Cardiovascular: Negative for chest pain, palpitations and leg swelling.  Gastrointestinal: Negative for nausea, vomiting, abdominal pain, diarrhea, constipation and abdominal distention.  Genitourinary: Negative for dysuria, urgency and difficulty urinating.  Musculoskeletal: Negative for arthralgias and gait problem.  Skin: Negative for color change and rash.  Hematological: Negative for adenopathy.  Psychiatric/Behavioral: Negative for suicidal ideas, sleep disturbance and dysphoric mood. The patient is not nervous/anxious.        Objective:    BP 156/76 mmHg  Pulse 67  Temp(Src) 97.6 F (36.4 C) (Oral)  Ht 6\' 1"  (1.854 m)  Wt 226 lb 4 oz (102.626 kg)  BMI 29.86 kg/m2  SpO2 100% Physical Exam  Constitutional: He is oriented to person, place, and time. He appears well-developed and well-nourished. No distress.  HENT:  Head: Normocephalic and atraumatic.  Right Ear: External ear normal.  Left Ear: External ear normal.  Nose: Nose normal.  Mouth/Throat: Oropharynx is clear and moist.  Eyes: Conjunctivae and EOM are normal. Pupils are equal, round, and reactive to light. Right eye exhibits no discharge. Left eye exhibits no discharge. No scleral icterus.  Neck: Normal range of motion. Neck  supple. No tracheal deviation present. No thyromegaly present.  Cardiovascular: Normal rate, regular rhythm and normal heart sounds.  Exam reveals no gallop and no friction rub.   No murmur heard. Pulmonary/Chest: Effort normal and breath sounds normal. No accessory muscle usage. No tachypnea. No respiratory distress. He has no decreased breath sounds. He has no wheezes. He has no rhonchi. He has no rales. He exhibits no tenderness.  Musculoskeletal: Normal range of motion. He exhibits no edema.  Lymphadenopathy:    He has no cervical adenopathy.  Neurological: He is alert and oriented to person, place, and time. No cranial nerve deficit. Coordination normal.  Skin: Skin is warm and dry. No  rash noted. He is not diaphoretic. No erythema. No pallor.  Psychiatric: He has a normal mood and affect. His behavior is normal. Judgment and thought content normal.          Assessment & Plan:   Problem List Items Addressed This Visit      Unprioritized   Essential hypertension - Primary    BP Readings from Last 3 Encounters:  01/06/16 156/76  01/02/16 180/82  08/28/15 130/72   BP improved on Amlodipine. Will continue to monitor and repeat check in 4 weeks.          Return in about 4 weeks (around 02/03/2016) for Recheck of Blood Pressure.

## 2016-01-06 NOTE — Assessment & Plan Note (Signed)
BP Readings from Last 3 Encounters:  01/06/16 156/76  01/02/16 180/82  08/28/15 130/72   BP improved on Amlodipine. Will continue to monitor and repeat check in 4 weeks.

## 2016-01-06 NOTE — Patient Instructions (Signed)
Continue Amlodipine 2.5mg  daily.  Follow up in 4 weeks.

## 2016-01-08 ENCOUNTER — Other Ambulatory Visit: Payer: Self-pay

## 2016-01-08 DIAGNOSIS — R1310 Dysphagia, unspecified: Secondary | ICD-10-CM

## 2016-01-08 MED ORDER — OMEPRAZOLE 20 MG PO CPDR: 20.0000 mg | DELAYED_RELEASE_CAPSULE | Freq: Every day | ORAL | Status: DC

## 2016-01-12 DIAGNOSIS — Z1211 Encounter for screening for malignant neoplasm of colon: Secondary | ICD-10-CM | POA: Diagnosis not present

## 2016-01-12 DIAGNOSIS — R1310 Dysphagia, unspecified: Secondary | ICD-10-CM | POA: Diagnosis not present

## 2016-02-03 ENCOUNTER — Ambulatory Visit (INDEPENDENT_AMBULATORY_CARE_PROVIDER_SITE_OTHER): Payer: PPO

## 2016-02-03 VITALS — BP 128/66 | HR 64 | Resp 18

## 2016-02-03 DIAGNOSIS — I10 Essential (primary) hypertension: Secondary | ICD-10-CM

## 2016-02-03 NOTE — Progress Notes (Signed)
Patient came in for BP recheck.  Has been taking his BP at home, readings in the 123XX123 to 123XX123 systolic at home.  Rechecked bilateral upper extremities, see vitals for details.  No issues with taking Amlodipine as prescribed no issues.   Please advise.

## 2016-02-03 NOTE — Progress Notes (Signed)
OK. Follow up as scheduled. BP looks good.

## 2016-02-09 ENCOUNTER — Ambulatory Visit
Admission: RE | Admit: 2016-02-09 | Discharge: 2016-02-09 | Disposition: A | Discharge status: Home / Self Care | Payer: PPO | Source: Ambulatory Visit | Attending: Gastroenterology | Admitting: Gastroenterology

## 2016-02-09 ENCOUNTER — Encounter
Admission: RE | Disposition: A | Discharge status: Home / Self Care | Payer: Self-pay | Source: Ambulatory Visit | Attending: Gastroenterology

## 2016-02-09 ENCOUNTER — Ambulatory Visit: Payer: PPO | Admitting: Anesthesiology

## 2016-02-09 ENCOUNTER — Encounter: Payer: Self-pay | Admitting: Anesthesiology

## 2016-02-09 DIAGNOSIS — Z79899 Other long term (current) drug therapy: Secondary | ICD-10-CM | POA: Insufficient documentation

## 2016-02-09 DIAGNOSIS — K222 Esophageal obstruction: Secondary | ICD-10-CM | POA: Diagnosis not present

## 2016-02-09 DIAGNOSIS — Z1211 Encounter for screening for malignant neoplasm of colon: Secondary | ICD-10-CM | POA: Insufficient documentation

## 2016-02-09 DIAGNOSIS — R1310 Dysphagia, unspecified: Secondary | ICD-10-CM | POA: Insufficient documentation

## 2016-02-09 DIAGNOSIS — K210 Gastro-esophageal reflux disease with esophagitis: Secondary | ICD-10-CM | POA: Diagnosis not present

## 2016-02-09 DIAGNOSIS — E785 Hyperlipidemia, unspecified: Secondary | ICD-10-CM | POA: Diagnosis not present

## 2016-02-09 DIAGNOSIS — I10 Essential (primary) hypertension: Secondary | ICD-10-CM | POA: Diagnosis not present

## 2016-02-09 DIAGNOSIS — M1990 Unspecified osteoarthritis, unspecified site: Secondary | ICD-10-CM | POA: Diagnosis not present

## 2016-02-09 DIAGNOSIS — K209 Esophagitis, unspecified: Secondary | ICD-10-CM | POA: Diagnosis not present

## 2016-02-09 DIAGNOSIS — Z7982 Long term (current) use of aspirin: Secondary | ICD-10-CM | POA: Insufficient documentation

## 2016-02-09 HISTORY — PX: ESOPHAGOGASTRODUODENOSCOPY (EGD) WITH PROPOFOL: SHX5813

## 2016-02-09 HISTORY — PX: COLONOSCOPY WITH PROPOFOL: SHX5780

## 2016-02-09 LAB — HM COLONOSCOPY: HM Colonoscopy: NORMAL

## 2016-02-09 SURGERY — COLONOSCOPY WITH PROPOFOL
Anesthesia: General

## 2016-02-09 MED ORDER — PHENYLEPHRINE HCL 10 MG/ML IJ SOLN
INTRAMUSCULAR | Status: DC | PRN
  Administered 2016-02-09: 100 ug via INTRAVENOUS

## 2016-02-09 MED ORDER — PROPOFOL 500 MG/50ML IV EMUL
INTRAVENOUS | Status: DC | PRN
  Administered 2016-02-09: 100 ug/kg/min via INTRAVENOUS

## 2016-02-09 MED ORDER — SODIUM CHLORIDE 0.9 % IV SOLN: INTRAVENOUS | Status: DC

## 2016-02-09 MED ORDER — SODIUM CHLORIDE 0.9 % IV SOLN
INTRAVENOUS | Status: DC
  Administered 2016-02-09 (×2): via INTRAVENOUS

## 2016-02-09 NOTE — Op Note (Signed)
[facility] Patient Name: Brian Pearson Procedure Date: 02/09/2016 11:03 AM MRN: [id number] Account #: [id number] Date of Birth: Aug 27, 1936 Admit Type: Outpatient Age: 80 Room: [facility] ENDO ROOM 4 Gender: Male Note Status: Finalized Procedure:            Upper GI endoscopy Indications:          Dysphagia Providers:            Lupita Dawn. Candace Cruise, MD Referring MD:         Eduard Clos. Gilford Rile, MD (Referring MD) Medicines:            Monitored Anesthesia Care Complications:        No immediate complications. Procedure:            Pre-Anesthesia Assessment:                       - Prior to the procedure, a History and Physical was                        performed, and patient medications, allergies and                        sensitivities were reviewed. The patient's tolerance of                        previous anesthesia was reviewed.                       - The risks and benefits of the procedure and the                        sedation options and risks were discussed with the                        patient. All questions were answered and informed                        consent was obtained.                       - After reviewing the risks and benefits, the patient                        was deemed in satisfactory condition to undergo the                        procedure.                       After obtaining informed consent, the endoscope was                        passed under direct vision. Throughout the procedure,                        the patient's blood pressure, pulse, and oxygen                        saturations were monitored continuously. The Endoscope  was introduced through the mouth, and advanced to the                        second part of duodenum. The upper GI endoscopy was                        accomplished without difficulty. The patient tolerated                        the procedure well. Findings:      One  mild benign-appearing, intrinsic stenosis was found at the       gastroesophageal junction. The scope was withdrawn. Dilation was       performed with a Maloney dilator with no resistance at 61 Fr.      LA Grade A (one or more mucosal breaks less than 5 mm, not extending       between tops of 2 mucosal folds) esophagitis was found at the       gastroesophageal junction. Biopsies were taken with a cold forceps for       histology.      The exam was otherwise without abnormality.      The entire examined stomach was normal.      The examined duodenum was normal. Impression:           - Benign-appearing esophageal stenosis. Dilated.                       - LA Grade A reflux esophagitis. Biopsied.                       - The examination was otherwise normal.                       - Normal stomach.                       - Normal examined duodenum. Recommendation:       - Discharge patient to home.                       - Observe patient's clinical course.                       - Await pathology results.                       - Continue present medications.                       - The findings and recommendations were discussed with                        the patient. Procedure Code(s):    --- Professional ---                       [phone], Esophagogastroduodenoscopy, flexible, transoral;                        with biopsy, single or multiple                       43450, Dilation of esophagus, by unguided sound or  bougie, single or multiple passes Diagnosis Code(s):    --- Professional ---                       K22.2, Esophageal obstruction                       K21.0, Gastro-esophageal reflux disease with esophagitis                       R13.10, Dysphagia, unspecified CPT copyright 2016 American Medical Association. All rights reserved. The codes documented in this report are preliminary and upon coder review may  be revised to meet current compliance  requirements. Hulen Luster, MD 02/09/2016 11:16:25 AM This report has been signed electronically. Number of Addenda: 0 Note Initiated On: 02/09/2016 11:03 AM      [facility]

## 2016-02-09 NOTE — Anesthesia Preprocedure Evaluation (Addendum)
Anesthesia Evaluation  Patient identified by MRN, date of birth, ID band Patient awake    Reviewed: Allergy & Precautions, H&P , NPO status , Patient's Chart, lab work & pertinent test results, reviewed documented beta blocker date and time   History of Anesthesia Complications Negative for: history of anesthetic complications  Airway Mallampati: II  TM Distance: >3 FB Neck ROM: full    Dental no notable dental hx. (+) Edentulous Upper, Missing   Pulmonary neg pulmonary ROS,    Pulmonary exam normal breath sounds clear to auscultation       Cardiovascular Exercise Tolerance: Good hypertension, On Medications (-) angina(-) CAD, (-) Past MI, (-) Cardiac Stents and (-) CABG Normal cardiovascular exam(-) dysrhythmias (-) Valvular Problems/Murmurs Rhythm:regular Rate:Normal     Neuro/Psych negative neurological ROS  negative psych ROS   GI/Hepatic Neg liver ROS, GERD  ,  Endo/Other  negative endocrine ROS  Renal/GU negative Renal ROS  negative genitourinary   Musculoskeletal   Abdominal   Peds  Hematology negative hematology ROS (+)   Anesthesia Other Findings Past Medical History:   Arthritis                                                    GERD (gastroesophageal reflux disease)                       Hypertension                                                 Urine incontinence                                           Hyperlipidemia                                                 Comment:taken off simvastatin last year   Pneumonia                                                      Comment:hospitalized    Reproductive/Obstetrics negative OB ROS                            Anesthesia Physical Anesthesia Plan  ASA: II  Anesthesia Plan: General   Post-op Pain Management:    Induction:   Airway Management Planned:   Additional Equipment:   Intra-op Plan:   Post-operative  Plan:   Informed Consent: I have reviewed the patients History and Physical, chart, labs and discussed the procedure including the risks, benefits and alternatives for the proposed anesthesia with the patient or authorized representative who has indicated his/her understanding and acceptance.   Dental Advisory Given  Plan Discussed with: Anesthesiologist, CRNA and Surgeon  Anesthesia Plan Comments:  Anesthesia Quick Evaluation  

## 2016-02-09 NOTE — Op Note (Signed)
[facility] Patient Name: Brian Pearson Procedure Date: 02/09/2016 11:02 AM MRN: [id number] Account #: [id number] Date of Birth: 1936-05-07 Admit Type: Outpatient Age: 80 Room: [facility] ENDO ROOM 4 Gender: Male Note Status: Finalized Procedure:            Colonoscopy Indications:          Screening for colorectal malignant neoplasm Providers:            Lupita Dawn. Candace Cruise, MD Referring MD:         Eduard Clos. Gilford Rile, MD (Referring MD) Medicines:            Monitored Anesthesia Care Complications:        No immediate complications. Procedure:            Pre-Anesthesia Assessment:                       - Prior to the procedure, a History and Physical was                        performed, and patient medications, allergies and                        sensitivities were reviewed. The patient's tolerance of                        previous anesthesia was reviewed.                       - The risks and benefits of the procedure and the                        sedation options and risks were discussed with the                        patient. All questions were answered and informed                        consent was obtained.                       - After reviewing the risks and benefits, the patient                        was deemed in satisfactory condition to undergo the                        procedure.                       After obtaining informed consent, the colonoscope was                        passed under direct vision. Throughout the procedure,                        the patient's blood pressure, pulse, and oxygen                        saturations were monitored continuously. The  Colonoscope was introduced through the anus and                        advanced to the the cecum, identified by appendiceal                        orifice and ileocecal valve. The colonoscopy was                        performed without difficulty. The  patient tolerated the                        procedure well. The quality of the bowel preparation                        was good. Findings:      The colon (entire examined portion) appeared normal. Impression:           - The entire examined colon is normal.                       - No specimens collected. Recommendation:       - Discharge patient to home.                       - The findings and recommendations were discussed with                        the patient. Procedure Code(s):    --- Professional ---                       [phone], Colonoscopy, flexible; diagnostic, including                        collection of specimen(s) by brushing or washing, when                        performed (separate procedure) Diagnosis Code(s):    --- Professional ---                       Z12.11, Encounter for screening for malignant neoplasm                        of colon CPT copyright 2016 American Medical Association. All rights reserved. The codes documented in this report are preliminary and upon coder review may  be revised to meet current compliance requirements. Hulen Luster, MD 02/09/2016 11:28:06 AM This report has been signed electronically. Number of Addenda: 0 Note Initiated On: 02/09/2016 11:02 AM Scope Withdrawal Time: 0 hours 2 minutes 58 seconds  Total Procedure Duration: 0 hours 7 minutes 51 seconds       [facility]

## 2016-02-09 NOTE — Transfer of Care (Signed)
Immediate Anesthesia Transfer of Care Note  Patient: Brian Pearson  Procedure(s) Performed: Procedure(s): COLONOSCOPY WITH PROPOFOL (N/A) ESOPHAGOGASTRODUODENOSCOPY (EGD) WITH PROPOFOL (N/A)  Patient Location: PACU  Anesthesia Type:General  Level of Consciousness: awake, alert  and oriented  Airway & Oxygen Therapy: Patient Spontanous Breathing and Patient connected to nasal cannula oxygen  Post-op Assessment: Report given to RN and Post -op Vital signs reviewed and stable  Post vital signs: Reviewed and stable  Last Vitals:  Filed Vitals:   02/09/16 1011  BP: 154/76  Pulse: 67  Temp: 36.3 C  Resp: 19    Complications: No apparent anesthesia complications

## 2016-02-09 NOTE — H&P (Signed)
Primary Care Physician:  Rica Mast, MD Primary Gastroenterologist:  Dr. Candace Cruise  Pre-Procedure History & Physical: HPI:  Brian Pearson is a 80 y.o. male is here for an EGD/colonoscopy  Past Medical History  Diagnosis Date  . Arthritis   . GERD (gastroesophageal reflux disease)   . Hypertension   . Urine incontinence   . Hyperlipidemia     taken off simvastatin last year  . Pneumonia     hospitalized     Past Surgical History  Procedure Laterality Date  . Hernia repair      1990's - 3 surgeries    Prior to Admission medications   Medication Sig Start Date End Date Taking? Authorizing Provider  amLODipine (NORVASC) 2.5 MG tablet Take 1 tablet (2.5 mg total) by mouth daily. 01/02/16  Yes Jackolyn Confer, MD  aspirin EC 81 MG tablet Take by mouth.    Historical Provider, MD  Coenzyme Q10 (COQ10 PO) Take by mouth.    Historical Provider, MD  Cyanocobalamin (B-12 PO) Take by mouth.    Historical Provider, MD  finasteride (PROSCAR) 5 MG tablet Take 5 mg by mouth daily.    Historical Provider, MD  Multiple Vitamin (MULTI-VITAMINS) TABS Take by mouth.    Historical Provider, MD  Omega-3 Fatty Acids (FISH OIL) 1000 MG CAPS Take by mouth.    Historical Provider, MD  omeprazole (PRILOSEC) 20 MG capsule Take 1 capsule (20 mg total) by mouth daily. 01/08/16   Jackolyn Confer, MD  simvastatin (ZOCOR) 20 MG tablet Take 1 tablet (20 mg total) by mouth at bedtime. 11/21/14   Jackolyn Confer, MD  tamsulosin (FLOMAX) 0.4 MG CAPS capsule Take 0.4 mg by mouth.    Historical Provider, MD    Allergies as of 02/04/2016  . (No Known Allergies)    Family History  Problem Relation Age of Onset  . Arthritis Mother   . Heart disease Mother     CHF  . Cancer Father     Prostate  . Dementia Father     Social History   Social History  . Marital Status: Married    Spouse Name: N/A  . Number of Children: N/A  . Years of Education: N/A   Occupational History  . Not on  file.   Social History Main Topics  . Smoking status: Never Smoker   . Smokeless tobacco: Not on file  . Alcohol Use: No  . Drug Use: No  . Sexual Activity: Yes   Other Topics Concern  . Not on file   Social History Narrative   Lives in [place].      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 Neck:  Supple; no masses or thyromegaly. Lungs:  Clear throughout to auscultation.    Heart:  Regular rate and rhythm. Abdomen:  Soft, nontender and nondistended. Normal bowel sounds, without guarding, and without  rebound.   Neurologic:  Alert and  oriented x4;  grossly normal neurologically.  Impression/Plan: Brian Pearson is here for an EGD/colonoscopy to be performed for dysphagia, screening  Risks, benefits, limitations, and alternatives regarding  colonoscopy have been reviewed with the patient.  Questions have been answered.  All parties agreeable.   Ryane Konieczny, Lupita Dawn, MD  02/09/2016, 10:22 AM

## 2016-02-09 NOTE — Anesthesia Postprocedure Evaluation (Signed)
Anesthesia Post Note  Patient: Brian Pearson  Procedure(s) Performed: Procedure(s) (LRB): COLONOSCOPY WITH PROPOFOL (N/A) ESOPHAGOGASTRODUODENOSCOPY (EGD) WITH PROPOFOL (N/A)  Patient location during evaluation: PACU Anesthesia Type: General Level of consciousness: awake and alert Pain management: pain level controlled Vital Signs Assessment: post-procedure vital signs reviewed and stable Respiratory status: spontaneous breathing, nonlabored ventilation, respiratory function stable and patient connected to nasal cannula oxygen Cardiovascular status: blood pressure returned to baseline and stable Postop Assessment: no signs of nausea or vomiting Anesthetic complications: no    Last Vitals:  Filed Vitals:   02/09/16 1153 02/09/16 1203  BP: 130/64 121/71  Pulse: 57 58  Temp:    Resp: 24 14    Last Pain: There were no vitals filed for this visit.               Martha Clan

## 2016-02-10 ENCOUNTER — Encounter: Payer: Self-pay | Admitting: Gastroenterology

## 2016-02-10 LAB — SURGICAL PATHOLOGY

## 2016-02-20 ENCOUNTER — Encounter: Payer: Self-pay | Admitting: Internal Medicine

## 2016-03-16 ENCOUNTER — Telehealth: Payer: Self-pay | Admitting: Urology

## 2016-03-16 DIAGNOSIS — N400 Enlarged prostate without lower urinary tract symptoms: Secondary | ICD-10-CM

## 2016-03-16 MED ORDER — FINASTERIDE 5 MG PO TABS: 5.0000 mg | ORAL_TABLET | Freq: Every day | ORAL | Status: DC

## 2016-03-16 NOTE — Telephone Encounter (Signed)
Patient called this morning about a refill on his Finasteride (5mg ).  He has an appointment on 03/30/2016.  He would like it called into the [facility] on [address].

## 2016-03-16 NOTE — Telephone Encounter (Signed)
Medication sent to pharmacy  

## 2016-03-16 NOTE — Telephone Encounter (Signed)
Okay to refill the finasteride 5 mg daily.  He may have a 30 day supply.

## 2016-03-17 ENCOUNTER — Other Ambulatory Visit: Payer: Self-pay

## 2016-03-17 DIAGNOSIS — N400 Enlarged prostate without lower urinary tract symptoms: Secondary | ICD-10-CM

## 2016-03-17 MED ORDER — FINASTERIDE 5 MG PO TABS: 5.0000 mg | ORAL_TABLET | Freq: Every day | ORAL | Status: DC

## 2016-03-24 ENCOUNTER — Telehealth: Payer: Self-pay | Admitting: Urology

## 2016-03-24 NOTE — Telephone Encounter (Signed)
Hope with [facility] called about pt and wants to know if they can fill a 90 day supply of prescription (she didn't say what) or did we want to have it filled locally with a 30 day supply.  Please give pharmacist a call at [phone]

## 2016-03-25 NOTE — Telephone Encounter (Signed)
LMOM for Hope to return call.

## 2016-03-30 ENCOUNTER — Ambulatory Visit: Payer: Self-pay | Admitting: Urology

## 2016-03-30 ENCOUNTER — Other Ambulatory Visit: Payer: Self-pay | Admitting: Internal Medicine

## 2016-03-30 ENCOUNTER — Encounter: Payer: Self-pay | Admitting: *Deleted

## 2016-03-30 DIAGNOSIS — E785 Hyperlipidemia, unspecified: Secondary | ICD-10-CM

## 2016-03-30 MED ORDER — SIMVASTATIN 20 MG PO TABS: 20.0000 mg | ORAL_TABLET | Freq: Every day | ORAL | Status: DC

## 2016-03-30 NOTE — Telephone Encounter (Signed)
Pt called about needing a Rx refill for simvastatin (ZOCOR) 20 MG tablet. Need for 90 day supply. Pharmacy is [facility] [place], [place]. Call pt @ [phone]. Thank you!

## 2016-03-30 NOTE — Telephone Encounter (Signed)
LMOM- for Hope to return the call.

## 2016-03-30 NOTE — Telephone Encounter (Signed)
Refilled per request.

## 2016-03-31 NOTE — Telephone Encounter (Signed)
LMOM for [facility].

## 2016-04-06 ENCOUNTER — Ambulatory Visit (INDEPENDENT_AMBULATORY_CARE_PROVIDER_SITE_OTHER): Payer: PPO | Admitting: Urology

## 2016-04-06 VITALS — BP 168/73 | HR 59 | Ht 73.0 in | Wt 220.7 lb

## 2016-04-06 DIAGNOSIS — Z125 Encounter for screening for malignant neoplasm of prostate: Secondary | ICD-10-CM

## 2016-04-06 DIAGNOSIS — R3911 Hesitancy of micturition: Secondary | ICD-10-CM

## 2016-04-06 DIAGNOSIS — N400 Enlarged prostate without lower urinary tract symptoms: Secondary | ICD-10-CM | POA: Diagnosis not present

## 2016-04-06 LAB — BLADDER SCAN AMB NON-IMAGING: Scan Result: 36

## 2016-04-06 LAB — URINALYSIS, COMPLETE
Bilirubin, UA: NEGATIVE
Glucose, UA: NEGATIVE
Ketones, UA: NEGATIVE
Leukocytes, UA: NEGATIVE
Nitrite, UA: NEGATIVE
Protein, UA: NEGATIVE
RBC, UA: NEGATIVE
Specific Gravity, UA: 1.015 (ref 1.005–1.030)
Urobilinogen, Ur: 0.2 mg/dL (ref 0.2–1.0)
pH, UA: 7 (ref 5.0–7.5)

## 2016-04-06 LAB — MICROSCOPIC EXAMINATION
Bacteria, UA: NONE SEEN
Epithelial Cells (non renal): NONE SEEN /hpf (ref 0–10)
RBC, UA: NONE SEEN /hpf (ref 0–?)
WBC, UA: NONE SEEN /hpf (ref 0–?)

## 2016-04-06 MED ORDER — TAMSULOSIN HCL 0.4 MG PO CAPS: 0.4000 mg | ORAL_CAPSULE | Freq: Every day | ORAL | Status: DC

## 2016-04-06 MED ORDER — FINASTERIDE 5 MG PO TABS: 5.0000 mg | ORAL_TABLET | Freq: Every day | ORAL | Status: DC

## 2016-04-06 NOTE — Progress Notes (Signed)
04/06/2016 8:29 AM   Brian Pearson 02/24/1936 [id number]  Referring provider: Jackolyn Confer, MD [id number] [place], [place] [address]  Chief Complaint  Patient presents with  . Follow-up    HPI:  1 - Lower Urinary Tract Symptoms / Urinary Urgency -  On finasteride + tamsulosin x years for mix of irritative and obstructive symptoms. Now mostly bothored by occasional very small voluem urge leakage, but does not desire additional therapy. PVR 03/2016 "52mL" / normal.   2 - Prostate Screening - Pt's father with prostate cance.r  PSA 2.5 2016 at age 57, now just annual DRE with exam.   PMH sig for bilat inguinal hernia repair. NO CV disease / blood thinners.   Today "Brian Pearson" is seen in f/u above. No interval gross hematuria or urinary retention.   PMH: Past Medical History  Diagnosis Date  . Arthritis   . GERD (gastroesophageal reflux disease)   . Hypertension   . Urine incontinence   . Hyperlipidemia     taken off simvastatin last year  . Pneumonia     hospitalized   . Urinary frequency   . BPH with obstruction/lower urinary tract symptoms   . Obesity     Surgical History: Past Surgical History  Procedure Laterality Date  . Hernia repair      1990's - 3 surgeries  . Colonoscopy with propofol N/A 02/09/2016    Procedure: COLONOSCOPY WITH PROPOFOL;  Surgeon: Hulen Luster, MD;  Location: [facility] ENDOSCOPY;  Service: Gastroenterology;  Laterality: N/A;  . Esophagogastroduodenoscopy (egd) with propofol N/A 02/09/2016    Procedure: ESOPHAGOGASTRODUODENOSCOPY (EGD) WITH PROPOFOL;  Surgeon: Hulen Luster, MD;  Location: [facility] ENDOSCOPY;  Service: Gastroenterology;  Laterality: N/A;    Home Medications:    Medication List       This list is accurate as of: 04/06/16  8:29 AM.  Always use your most recent med list.               amLODipine 2.5 MG tablet  Commonly known as:  NORVASC  Take 1 tablet (2.5 mg total) by mouth daily.     aspirin EC 81 MG tablet   Take by mouth.     B-12 PO  Take by mouth.     COQ10 PO  Take by mouth.     finasteride 5 MG tablet  Commonly known as:  PROSCAR  Take 1 tablet (5 mg total) by mouth daily.     Fish Oil 1000 MG Caps  Take by mouth.     MULTI-VITAMINS Tabs  Take by mouth.     omeprazole 20 MG capsule  Commonly known as:  PRILOSEC  Take 1 capsule (20 mg total) by mouth daily.     simvastatin 20 MG tablet  Commonly known as:  ZOCOR  Take 1 tablet (20 mg total) by mouth at bedtime.     tamsulosin 0.4 MG Caps capsule  Commonly known as:  FLOMAX  Take 0.4 mg by mouth.        Allergies: No Known Allergies  Family History: Family History  Problem Relation Age of Onset  . Arthritis Mother   . Heart disease Mother     CHF  . Cancer Father     Prostate  . Dementia Father     Social History:  reports that he has never smoked. He does not have any smokeless tobacco history on file. He reports that he does not drink alcohol or use illicit  drugs.  ROS:   Gastrointestinal (upper)  : Negative for upper GI symptoms  Gastrointestinal (lower) : Negative for lower GI symptoms  Constitutional : Negative for symptoms  Skin: Negative for skin symptoms  Eyes: Negative for eye symptoms  Ear/Nose/Throat : Negative for Ear/Nose/Throat symptoms  Hematologic/Lymphatic: Negative for Hematologic/Lymphatic symptoms  Cardiovascular : Negative for cardiovascular symptoms  Respiratory : Negative for respiratory symptoms  Endocrine: Negative for endocrine symptoms  Musculoskeletal: Negative for musculoskeletal symptoms  Neurological: non new weakness  Psychologic: Negative for psychiatric symptoms        Physical Exam: There were no vitals taken for this visit.  Constitutional:  Alert and oriented, No acute distress. HEENT: [place] AT, moist mucus membranes.  Trachea midline, no masses. Cardiovascular: No clubbing, cyanosis, or edema. Respiratory: Normal respiratory effort,  no increased work of breathing. GI: Abdomen is soft, nontender, nondistended, no abdominal masses GU: No CVA tenderness. DRE 48mL smooth. Non-circ'd. No phimosis. Testes down w/o masses. No hernias.  Skin: No rashes, bruises or suspicious lesions. Lymph: No cervical or inguinal adenopathy. Neurologic: Grossly intact, no focal deficits, moving all 4 extremities. Psychiatric: Normal mood and affect.  Laboratory Data: Lab Results  Component Value Date   WBC 5.7 01/02/2016   HGB 14.8 01/02/2016   HCT 45.3 01/02/2016   MCV 94.9 01/02/2016   PLT 207.0 01/02/2016    Lab Results  Component Value Date   CREATININE 0.93 01/02/2016    Lab Results  Component Value Date   PSA 2.50 02/21/2015    No results found for: TESTOSTERONE  No results found for: HGBA1C  Urinalysis    Component Value Date/Time   BILIRUBINUR neg 02/21/2015 0954   PROTEINUR neg 02/21/2015 0954   UROBILINOGEN 0.2 02/21/2015 0954   NITRITE neg 02/21/2015 0954   LEUKOCYTESUR Negative 02/21/2015 0954    PVR "64mL"  Assessment & Plan:    1 - Lower Urinary Tract Symptoms / Urinary Urgency -  Continue finasteride + tamsulosin, refilled today x 1 year. Discussed more aggressive therapy with addiiton of anticholinergic or outlet procedure but he is satisfied with current regime.   2 - Prostate Screening -no indication for further PSA based screening at age >13 and normal prior. DRE today reassuring.   3 - RTc 1 year any provider.  No Follow-up on file.  Alexis Frock, [facility] [address] [place],  [address] [phone]

## 2016-04-06 NOTE — Progress Notes (Signed)
Bladder Scan Patient  void: 37 ml Performed By: K.Suzanne Garbers,CMA 

## 2016-04-14 ENCOUNTER — Telehealth: Payer: Self-pay | Admitting: Urology

## 2016-04-14 DIAGNOSIS — N400 Enlarged prostate without lower urinary tract symptoms: Secondary | ICD-10-CM

## 2016-04-14 NOTE — Telephone Encounter (Signed)
Pt called and has no refills on Finasteride 5 mg.  He wants to know if he can get 3 months supply to [facility] on [address].

## 2016-04-16 MED ORDER — FINASTERIDE 5 MG PO TABS: 5.0000 mg | ORAL_TABLET | Freq: Every day | ORAL | Status: DC

## 2016-04-16 NOTE — Telephone Encounter (Signed)
Medication sent to pt pharmacy 

## 2016-08-30 ENCOUNTER — Ambulatory Visit (INDEPENDENT_AMBULATORY_CARE_PROVIDER_SITE_OTHER): Payer: PPO

## 2016-08-30 VITALS — BP 160/70 | HR 67 | Temp 97.4°F | Resp 14 | Ht 72.0 in | Wt 222.8 lb

## 2016-08-30 DIAGNOSIS — Z23 Encounter for immunization: Secondary | ICD-10-CM

## 2016-08-30 DIAGNOSIS — Z0000 Encounter for general adult medical examination without abnormal findings: Secondary | ICD-10-CM

## 2016-08-30 NOTE — Progress Notes (Signed)
Subjective:   Brian Pearson is a 80 y.o. male who presents for Medicare Annual/Subsequent preventive examination.  Review of Systems:  No ROS.  Medicare Wellness Visit.  Cardiac Risk Factors include: advanced age (>37men, >38 women);hypertension;male gender     Objective:    Vitals: BP (!) 160/70 (BP Location: Left Arm, Patient Position: Sitting, Cuff Size: Normal)   Pulse 67   Temp 97.4 F (36.3 C) (Oral)   Resp 14   Ht 6' (1.829 m)   Wt 222 lb 12.8 oz (101.1 kg)   SpO2 97%   BMI 30.22 kg/m   Body mass index is 30.22 kg/m.  Tobacco History  Smoking Status  . Never Smoker  Smokeless Tobacco  . Not on file     Counseling given: Not Answered   Past Medical History:  Diagnosis Date  . Arthritis   . BPH with obstruction/lower urinary tract symptoms   . GERD (gastroesophageal reflux disease)   . Hyperlipidemia    taken off simvastatin last year  . Hypertension   . Obesity   . Pneumonia    hospitalized   . Urinary frequency   . Urine incontinence    Past Surgical History:  Procedure Laterality Date  . COLONOSCOPY WITH PROPOFOL N/A 02/09/2016   Procedure: COLONOSCOPY WITH PROPOFOL;  Surgeon: Hulen Luster, MD;  Location: [facility] ENDOSCOPY;  Service: Gastroenterology;  Laterality: N/A;  . ESOPHAGOGASTRODUODENOSCOPY (EGD) WITH PROPOFOL N/A 02/09/2016   Procedure: ESOPHAGOGASTRODUODENOSCOPY (EGD) WITH PROPOFOL;  Surgeon: Hulen Luster, MD;  Location: [facility] ENDOSCOPY;  Service: Gastroenterology;  Laterality: N/A;  . HERNIA REPAIR     1990's - 3 surgeries   Family History  Problem Relation Age of Onset  . Arthritis Mother   . Heart disease Mother     CHF  . Cancer Father     Prostate  . Dementia Father    History  Sexual Activity  . Sexual activity: Yes    Outpatient Encounter Prescriptions as of 08/30/2016  Medication Sig  . amLODipine (NORVASC) 2.5 MG tablet Take 1 tablet (2.5 mg total) by mouth daily.  Marland Kitchen aspirin EC 81 MG tablet Take by mouth.  . Cyanocobalamin  (B-12 PO) Take by mouth.  . finasteride (PROSCAR) 5 MG tablet Take 1 tablet (5 mg total) by mouth daily.  . Multiple Vitamin (MULTI-VITAMINS) TABS Take by mouth.  . Omega-3 Fatty Acids (FISH OIL) 1000 MG CAPS Take by mouth.  Marland Kitchen omeprazole (PRILOSEC) 20 MG capsule Take 1 capsule (20 mg total) by mouth daily.  . simvastatin (ZOCOR) 20 MG tablet Take 1 tablet (20 mg total) by mouth at bedtime.  . tamsulosin (FLOMAX) 0.4 MG CAPS capsule Take 1 capsule (0.4 mg total) by mouth daily.  . [DISCONTINUED] Coenzyme Q-10 100 MG capsule Take by mouth.  . [DISCONTINUED] Coenzyme Q10 (COQ10 PO) Take by mouth. Reported on 04/06/2016   No facility-administered encounter medications on file as of 08/30/2016.     Activities of Daily Living In your present state of health, do you have any difficulty performing the following activities: 08/30/2016  Hearing? Y  Vision? N  Difficulty concentrating or making decisions? N  Walking or climbing stairs? N  Dressing or bathing? N  Doing errands, shopping? N  Preparing Food and eating ? N  Using the Toilet? N  In the past six months, have you accidently leaked urine? N  Do you have problems with loss of bowel control? N  Managing your Medications? N  Managing your Finances?  N  Housekeeping or managing your Housekeeping? N  Some recent data might be hidden    Patient Care Team: Jackolyn Confer, MD as PCP - General (Internal Medicine)   Assessment:    This is a routine wellness examination for Brian Pearson. The goal of the wellness visit is to assist the patient how to close the gaps in care and create a preventative care plan for the patient.   Taking calcium VIT D as appropriate/Osteoporosis risk reviewed.  Medications reviewed; taking without issues or barriers.  Hx of HTN.  Takes medication at night.  Repeat blood pressure check today, same reading.    Safety issues reviewed; smoke and carbon monoxide detectors in the home. Firearms locked in a safe  within the home. Wears seatbelts when driving or riding with others. No violence in the home.  No identified risk were noted; The patient was oriented x 3; appropriate in dress and manner and no objective failures at ADL's or IADL's.   Body mass index; discussed the importance of a healthy diet, water intake and exercise. Educational material provided.  High dose influenza vaccine administered, L deltoid.  Tolerated well.  Health maintenance gaps; closed.  Patient Concerns: Follow up appointment scheduled with PCP.  Former pt of Dr. Gilford Rile.  Exercise Activities and Dietary recommendations Current Exercise Habits: Home exercise routine, Type of exercise: calisthenics;strength training/weights;stretching, Time (Minutes): 30, Frequency (Times/Week): 4, Weekly Exercise (Minutes/Week): 120, Intensity: Mild  Goals    . Increase physical activity          Incorporate walking with wife 30 minutes up to 4 days a week.  Drink more water. Currently drinking 2 (16oz)  bottle per day.  Will try to drink up to 3 bottles per day.      Fall Risk Fall Risk  08/30/2016 08/28/2015 02/21/2015  Falls in the past year? No No No   Depression Screen PHQ 2/9 Scores 08/30/2016 08/28/2015 02/21/2015  PHQ - 2 Score 0 0 0    Cognitive Testing MMSE - Mini Mental State Exam 08/30/2016 08/28/2015  Orientation to time 5 5  Orientation to Place 5 5  Registration 3 3  Attention/ Calculation 5 5  Recall 3 3  Language- name 2 objects 2 2  Language- repeat 1 1  Language- follow 3 step command 3 3  Language- read & follow direction 1 1  Write a sentence 1 1  Copy design 1 1  Total score 30 30    Immunization History  Administered Date(s) Administered  . Influenza, High Dose Seasonal PF 08/28/2015, 08/30/2016  . Influenza-Unspecified 09/21/2014  . Pneumococcal Conjugate-13 10/22/2014  . Pneumococcal-Unspecified 10/23/2011  . Tdap 08/28/2015   Screening Tests Health Maintenance  Topic Date Due  .  TETANUS/TDAP  08/27/2025  . INFLUENZA VACCINE  Completed  . ZOSTAVAX  Addressed  . PNA vac Low Risk Adult  Completed      Plan:    End of life planning; Advance aging; Advanced directives discussed. Copy of current HCPOA/Living Will on file.  During the course of the visit the patient was educated and counseled about the following appropriate screening and preventive services:   Vaccines to include Pneumoccal, Influenza, Hepatitis B, Td, Zostavax, HCV  Electrocardiogram  Cardiovascular Disease  Colorectal cancer screening  Diabetes screening  Prostate Cancer Screening  Glaucoma screening  Nutrition counseling   Smoking cessation counseling  Patient Instructions (the written plan) was given to the patient.    Varney Biles, LPN  [phone]  Reviewed  above information.  Agree with plan.    Dr Nicki Reaper

## 2016-08-30 NOTE — Patient Instructions (Addendum)
  Brian Pearson , Thank you for taking time to come for your Medicare Wellness Visit. I appreciate your ongoing commitment to your health goals. Please review the following plan we discussed and let me know if I can assist you in the future.   FOLLOW UP WITH DR. Nicki Reaper AS NEEDED.  These are the goals we discussed: Goals    . Increase physical activity          Incorporate walking with wife 30 minutes up to 4 days a week.  Drink more water. Currently drinking 2 (16oz)  bottle per day.  Will try to drink up to 3 bottles per day.       This is a list of the screening recommended for you and due dates:  Health Maintenance  Topic Date Due  . Tetanus Vaccine  08/27/2025  . Flu Shot  Completed  . Shingles Vaccine  Addressed  . Pneumonia vaccines  Completed

## 2016-10-06 ENCOUNTER — Ambulatory Visit (INDEPENDENT_AMBULATORY_CARE_PROVIDER_SITE_OTHER): Payer: PPO

## 2016-10-06 ENCOUNTER — Ambulatory Visit (INDEPENDENT_AMBULATORY_CARE_PROVIDER_SITE_OTHER): Payer: PPO | Admitting: Internal Medicine

## 2016-10-06 ENCOUNTER — Encounter (INDEPENDENT_AMBULATORY_CARE_PROVIDER_SITE_OTHER): Payer: Self-pay

## 2016-10-06 ENCOUNTER — Encounter: Payer: Self-pay | Admitting: Internal Medicine

## 2016-10-06 VITALS — BP 132/64 | HR 70 | Temp 98.4°F | Wt 220.4 lb

## 2016-10-06 DIAGNOSIS — R05 Cough: Secondary | ICD-10-CM | POA: Diagnosis not present

## 2016-10-06 DIAGNOSIS — E785 Hyperlipidemia, unspecified: Secondary | ICD-10-CM

## 2016-10-06 DIAGNOSIS — R3911 Hesitancy of micturition: Secondary | ICD-10-CM | POA: Diagnosis not present

## 2016-10-06 DIAGNOSIS — R1310 Dysphagia, unspecified: Secondary | ICD-10-CM | POA: Diagnosis not present

## 2016-10-06 DIAGNOSIS — R059 Cough, unspecified: Secondary | ICD-10-CM

## 2016-10-06 DIAGNOSIS — N400 Enlarged prostate without lower urinary tract symptoms: Secondary | ICD-10-CM

## 2016-10-06 DIAGNOSIS — I10 Essential (primary) hypertension: Secondary | ICD-10-CM

## 2016-10-06 DIAGNOSIS — J069 Acute upper respiratory infection, unspecified: Secondary | ICD-10-CM

## 2016-10-06 LAB — POCT INFLUENZA A/B
Influenza A, POC: NEGATIVE
Influenza B, POC: NEGATIVE

## 2016-10-06 MED ORDER — AZITHROMYCIN 250 MG PO TABS: ORAL_TABLET | ORAL | 0 refills | Status: DC

## 2016-10-06 MED ORDER — OMEPRAZOLE 20 MG PO CPDR: 20.0000 mg | DELAYED_RELEASE_CAPSULE | Freq: Every day | ORAL | 2 refills | Status: DC

## 2016-10-06 NOTE — Patient Instructions (Addendum)
Continue mucinex in the morning and robitussin DM in the evening.    Saline nasal spray - flush nose at least 2-3x/day  nasacort nasal spray - 2 sprays each nostril one time per day.  Do this in the evening.    Restart omeprazole.    If take an antibiotic, then start a probiotic daily while you are on the antibiotic and for two weeks after completing the antibiotic.

## 2016-10-06 NOTE — Progress Notes (Signed)
Pre visit review using our clinic review tool, if applicable. No additional management support is needed unless otherwise documented below in the visit note. 

## 2016-10-06 NOTE — Progress Notes (Signed)
Patient ID: Brian Pearson, male   DOB: [birth year], 80 y.o.   MRN: [id number]   Subjective:    Patient ID: Brian Pearson, male    DOB: Jun 06, 1936, 80 y.o.   MRN: [id number]  HPI  Patient here to establish care.  Former pt of Dr Gilford Rile.  He is having increased cough and congestion.  States symptoms started a few days ago.  Started with a scratchy throat.  Throat is better.  Some nasal congestion.  Runny nose.  Increased drainage.  Increased cough, productive of green mucus.  No increased sob.  No wheezing.  Off omeprazole.  Some acid reflux.  Discussed restarting.  No chest pain.  Eating and drinking.  No diarrhea.  No vomiting.  Blood pressures at home averaging 120-130s.  Sees urology for urinary issues.  Doing well on current regimen.  Had EGD in 01/2016.  S/p dilatation.  Swallowing is better.  Noticing some minimal issues.     Past Medical History:  Diagnosis Date  . Arthritis   . BPH with obstruction/lower urinary tract symptoms   . GERD (gastroesophageal reflux disease)   . Hyperlipidemia    taken off simvastatin last year  . Hypertension   . Obesity   . Pneumonia    hospitalized   . Urinary frequency   . Urine incontinence    Past Surgical History:  Procedure Laterality Date  . COLONOSCOPY WITH PROPOFOL N/A 02/09/2016   Procedure: COLONOSCOPY WITH PROPOFOL;  Surgeon: Hulen Luster, MD;  Location: [facility] ENDOSCOPY;  Service: Gastroenterology;  Laterality: N/A;  . ESOPHAGOGASTRODUODENOSCOPY (EGD) WITH PROPOFOL N/A 02/09/2016   Procedure: ESOPHAGOGASTRODUODENOSCOPY (EGD) WITH PROPOFOL;  Surgeon: Hulen Luster, MD;  Location: [facility] ENDOSCOPY;  Service: Gastroenterology;  Laterality: N/A;  . HERNIA REPAIR     1990's - 3 surgeries   Family History  Problem Relation Age of Onset  . Arthritis Mother   . Heart disease Mother     CHF  . Cancer Father     Prostate  . Dementia Father    Social History   Social History  . Marital status: Married    Spouse name: N/A  . Number of  children: N/A  . Years of education: N/A   Social History Main Topics  . Smoking status: Never Smoker  . Smokeless tobacco: Never Used  . Alcohol use 0.0 oz/week     Comment: OCC glass of wine  . Drug use: No  . Sexual activity: Yes   Other Topics Concern  . None   Social History Narrative   Lives in [place].      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 amLODipine (NORVASC) 2.5 MG tablet Take 1 tablet (2.5 mg total) by mouth daily.  Marland Kitchen aspirin EC 81 MG tablet Take by mouth.  . Cyanocobalamin (B-12 PO) Take by mouth.  . finasteride (PROSCAR) 5 MG tablet Take 1 tablet (5 mg total) by mouth daily.  . Multiple Vitamin (MULTI-VITAMINS) TABS Take by mouth.  . Omega-3 Fatty Acids (FISH OIL) 1000 MG CAPS Take by mouth.  Marland Kitchen omeprazole (PRILOSEC) 20 MG capsule Take 1 capsule (20 mg total) by mouth daily.  . simvastatin (ZOCOR) 20  MG tablet Take 1 tablet (20 mg total) by mouth at bedtime.  . tamsulosin (FLOMAX) 0.4 MG CAPS capsule Take 1 capsule (0.4 mg total) by mouth daily.  . [DISCONTINUED] omeprazole (PRILOSEC) 20 MG capsule Take 1 capsule (20 mg total) by mouth daily.  Marland Kitchen azithromycin (ZITHROMAX) 250 MG tablet Take two tablets x 1 day and then one tablet per day for four more days.   No facility-administered encounter medications on file as of 10/06/2016.    Review of Systems  Constitutional: Negative for appetite change and fever.  HENT: Positive for congestion, postnasal drip and sore throat. Negative for sinus pressure.   Respiratory: Positive for cough. Negative for chest tightness, shortness of breath and wheezing.   Cardiovascular: Negative for chest pain, palpitations and leg swelling.  Gastrointestinal: Negative for diarrhea, nausea and vomiting.  Genitourinary:  Negative for difficulty urinating and dysuria.  Musculoskeletal: Negative for joint swelling and myalgias.  Neurological: Negative for dizziness, light-headedness and headaches.  Psychiatric/Behavioral: Negative for agitation and dysphoric mood.       Objective:     Blood pressure rechecked by me:  132/64  Physical Exam  Constitutional: He appears well-developed and well-nourished. No distress.  HENT:  TMs without erythema.  Nares - slightly erythematous turbinates.  No tenderness to palpation over the sinuses.   Neck: Neck supple. No thyromegaly present.  Cardiovascular: Normal rate and regular rhythm.   Pulmonary/Chest: Effort normal and breath sounds normal. No respiratory distress.  Abdominal: Soft. Bowel sounds are normal. There is no tenderness.  Musculoskeletal: He exhibits no edema or tenderness.  Lymphadenopathy:    He has no cervical adenopathy.  Skin: No rash noted. No erythema.  Psychiatric: He has a normal mood and affect. His behavior is normal.    BP 132/64   Pulse 70   Temp 98.4 F (36.9 C) (Oral)   Wt 220 lb 6.4 oz (100 kg)   SpO2 96%   BMI 29.89 kg/m  Wt Readings from Last 3 Encounters:  10/06/16 220 lb 6.4 oz (100 kg)  08/30/16 222 lb 12.8 oz (101.1 kg)  04/06/16 220 lb 11.2 oz (100.1 kg)     Lab Results  Component Value Date   WBC 5.7 01/02/2016   HGB 14.8 01/02/2016   HCT 45.3 01/02/2016   PLT 207.0 01/02/2016   GLUCOSE 93 01/02/2016   CHOL 142 01/02/2016   TRIG 123.0 01/02/2016   HDL 55.00 01/02/2016   LDLCALC 62 01/02/2016   ALT 19 01/02/2016   AST 22 01/02/2016   NA 139 01/02/2016   K 4.3 01/02/2016   CL 105 01/02/2016   CREATININE 0.93 01/02/2016   BUN 16 01/02/2016   CO2 28 01/02/2016   TSH 2.02 01/02/2016   PSA 2.50 02/21/2015   MICROALBUR 0.7 10/22/2014       Assessment & Plan:   Problem List Items Addressed This Visit    Benign prostatic hyperplasia without urinary obstruction    Followed by urology.       Dysphagia     S/p EGD with dilatation.  Swallowing is better.  Still some minimal issues.  Some acid reflux.  Restart omeprazole.        Relevant Medications   omeprazole (PRILOSEC) 20 MG capsule   Essential hypertension    Blood pressure on recheck improved.  States mostly averages 120-130s at home.  Follow.  Check metabolic panel.        Relevant Orders   CBC with Differential/Platelet   Basic metabolic panel  Hyperlipidemia    On simvastatin.  Follow lipid panel and liver function tests.       Relevant Orders   Hepatic function panel   Lipid panel   URI (upper respiratory infection)    With increased cough and congestion as outlined.  Flu swab negative.  Will check cxr.  Treat with mucinex/robitussin as directed.  Saline nasal spray and nasacort nasal spray as directed.  rx given for abx.  Hold for now.  If symptoms do not improve (or worsen) on above regimen, will start.  If any evidence of pneumonia, will start.  Rest.  Fluids.  Follow.       Relevant Medications   azithromycin (ZITHROMAX) 250 MG tablet   Urinary hesitancy    Followed by urology.  Appears to be stable on current regimen.  They felt no further psa checks warranted.         Other Visit Diagnoses    Cough    -  Primary   Relevant Orders   POCT Influenza A/B (Completed)   DG Chest 2 View (Completed)       [name], MD

## 2016-10-07 ENCOUNTER — Encounter: Payer: Self-pay | Admitting: Internal Medicine

## 2016-10-07 DIAGNOSIS — J069 Acute upper respiratory infection, unspecified: Secondary | ICD-10-CM | POA: Insufficient documentation

## 2016-10-07 NOTE — Assessment & Plan Note (Signed)
Followed by urology.  Appears to be stable on current regimen.  They felt no further psa checks warranted.

## 2016-10-07 NOTE — Assessment & Plan Note (Signed)
Followed by urology.   

## 2016-10-07 NOTE — Assessment & Plan Note (Signed)
With increased cough and congestion as outlined.  Flu swab negative.  Will check cxr.  Treat with mucinex/robitussin as directed.  Saline nasal spray and nasacort nasal spray as directed.  rx given for abx.  Hold for now.  If symptoms do not improve (or worsen) on above regimen, will start.  If any evidence of pneumonia, will start.  Rest.  Fluids.  Follow.

## 2016-10-07 NOTE — Assessment & Plan Note (Signed)
S/p EGD with dilatation.  Swallowing is better.  Still some minimal issues.  Some acid reflux.  Restart omeprazole.

## 2016-10-07 NOTE — Assessment & Plan Note (Signed)
On simvastatin.  Follow lipid panel and liver function tests.   

## 2016-10-07 NOTE — Assessment & Plan Note (Signed)
Blood pressure on recheck improved.  States mostly averages 120-130s at home.  Follow.  Check metabolic panel.

## 2016-10-26 ENCOUNTER — Other Ambulatory Visit (INDEPENDENT_AMBULATORY_CARE_PROVIDER_SITE_OTHER): Payer: PPO

## 2016-10-26 DIAGNOSIS — I10 Essential (primary) hypertension: Secondary | ICD-10-CM

## 2016-10-26 DIAGNOSIS — E785 Hyperlipidemia, unspecified: Secondary | ICD-10-CM | POA: Diagnosis not present

## 2016-10-26 LAB — HEPATIC FUNCTION PANEL
ALT: 14 U/L (ref 0–53)
AST: 17 U/L (ref 0–37)
Albumin: 4.2 g/dL (ref 3.5–5.2)
Alkaline Phosphatase: 65 U/L (ref 39–117)
Bilirubin, Direct: 0.1 mg/dL (ref 0.0–0.3)
Total Bilirubin: 0.6 mg/dL (ref 0.2–1.2)
Total Protein: 6.4 g/dL (ref 6.0–8.3)

## 2016-10-26 LAB — CBC WITH DIFFERENTIAL/PLATELET
Basophils Absolute: 0 10*3/uL (ref 0.0–0.1)
Basophils Relative: 0.4 % (ref 0.0–3.0)
Eosinophils Absolute: 0.2 10*3/uL (ref 0.0–0.7)
Eosinophils Relative: 3.5 % (ref 0.0–5.0)
HCT: 42.3 % (ref 39.0–52.0)
Hemoglobin: 14.1 g/dL (ref 13.0–17.0)
Lymphocytes Relative: 29.8 % (ref 12.0–46.0)
Lymphs Abs: 2 10*3/uL (ref 0.7–4.0)
MCHC: 33.3 g/dL (ref 30.0–36.0)
MCV: 93.6 fl (ref 78.0–100.0)
Monocytes Absolute: 0.6 10*3/uL (ref 0.1–1.0)
Monocytes Relative: 9.2 % (ref 3.0–12.0)
Neutro Abs: 3.8 10*3/uL (ref 1.4–7.7)
Neutrophils Relative %: 57.1 % (ref 43.0–77.0)
Platelets: 219 10*3/uL (ref 150.0–400.0)
RBC: 4.52 Mil/uL (ref 4.22–5.81)
RDW: 13.3 % (ref 11.5–15.5)
WBC: 6.7 10*3/uL (ref 4.0–10.5)

## 2016-10-26 LAB — BASIC METABOLIC PANEL
BUN: 15 mg/dL (ref 6–23)
CO2: 27 mEq/L (ref 19–32)
Calcium: 9.1 mg/dL (ref 8.4–10.5)
Chloride: 107 mEq/L (ref 96–112)
Creatinine, Ser: 0.94 mg/dL (ref 0.40–1.50)
GFR: 82.36 mL/min (ref >60.00)
Glucose, Bld: 102 mg/dL — ABNORMAL HIGH (ref 70–99)
Potassium: 4.1 mEq/L (ref 3.5–5.1)
Sodium: 139 mEq/L (ref 135–145)

## 2016-10-26 LAB — LIPID PANEL
Cholesterol: 167 mg/dL (ref 0–200)
HDL: 57 mg/dL (ref >39.00)
LDL Cholesterol: 83 mg/dL (ref 0–99)
NonHDL: 110.17
Total CHOL/HDL Ratio: 3
Triglycerides: 134 mg/dL (ref 0.0–149.0)
VLDL: 26.8 mg/dL (ref 0.0–40.0)

## 2016-10-29 ENCOUNTER — Ambulatory Visit: Payer: PPO | Admitting: Internal Medicine

## 2016-12-06 ENCOUNTER — Ambulatory Visit: Payer: PPO | Admitting: Internal Medicine

## 2016-12-24 ENCOUNTER — Other Ambulatory Visit: Payer: Self-pay | Admitting: Surgical

## 2016-12-24 MED ORDER — AMLODIPINE BESYLATE 2.5 MG PO TABS: 2.5000 mg | ORAL_TABLET | Freq: Every day | ORAL | 1 refills | Status: DC

## 2017-02-24 ENCOUNTER — Other Ambulatory Visit: Payer: Self-pay | Admitting: Internal Medicine

## 2017-02-24 DIAGNOSIS — R1310 Dysphagia, unspecified: Secondary | ICD-10-CM

## 2017-04-04 NOTE — Progress Notes (Signed)
04/05/2017 9:08 AM   Brian Pearson 07-13-1936 [id number]  Referring provider: Jackolyn Confer, MD No address on file  Chief Complaint  Patient presents with  . Benign Prostatic Hypertrophy    1 year follow up    HPI: 81 yo WM with BPH with LU TS who presents today for a one year follow up.  BPH WITH LUTS His IPSS score today is 12, which is moderate lower urinary tract symptomatology. He is mostly satisfied with his quality life due to his urinary symptoms.  His PVR is 0 mL.   His previous PVR is 36 mL.  His major complaints today are frequency, urgency, nocturia x 3.  He has had these symptoms for several years.  He denies any dysuria, hematuria or suprapubic pain.  He currently taking tamsulosin 0.4 mg and finasteride 5 mg daily.  He also denies any recent fevers, chills, nausea or vomiting.  Patient's biggest complaint is urge incontinence. He states it occurs during the day and during the night. He sometimes wears pads especially if he is going on outings.        IPSS    Row Name 04/05/17 0800         International Prostate Symptom Score   How often have you had the sensation of not emptying your bladder? Less than 1 in 5     How often have you had to urinate less than every two hours? Less than half the time     How often have you found you stopped and started again several times when you urinated? Not at All     How often have you found it difficult to postpone urination? About half the time     How often have you had a weak urinary stream? About half the time     How often have you had to strain to start urination? Not at All     How many times did you typically get up at night to urinate? 3 Times     Total IPSS Score 12       Quality of Life due to urinary symptoms   If you were to spend the rest of your life with your urinary condition just the way it is now how would you feel about that? Mostly Satisfied        Score:  1-7 Mild 8-19 Moderate 20-35  Severe  Erectile dysfunction Patient mentioned during the visit that he has a difficult time maintaining erections. He states that he has not tried any medication or therapy for his EGD.  PMH: Past Medical History:  Diagnosis Date  . Arthritis   . BPH with obstruction/lower urinary tract symptoms   . GERD (gastroesophageal reflux disease)   . Hyperlipidemia    taken off simvastatin last year  . Hypertension   . Obesity   . Pneumonia    hospitalized   . Urinary frequency   . Urine incontinence     Surgical History: Past Surgical History:  Procedure Laterality Date  . COLONOSCOPY WITH PROPOFOL N/A 02/09/2016   Procedure: COLONOSCOPY WITH PROPOFOL;  Surgeon: Hulen Luster, MD;  Location: [facility] ENDOSCOPY;  Service: Gastroenterology;  Laterality: N/A;  . ESOPHAGOGASTRODUODENOSCOPY (EGD) WITH PROPOFOL N/A 02/09/2016   Procedure: ESOPHAGOGASTRODUODENOSCOPY (EGD) WITH PROPOFOL;  Surgeon: Hulen Luster, MD;  Location: [facility] ENDOSCOPY;  Service: Gastroenterology;  Laterality: N/A;  . HERNIA REPAIR     1990's - 3 surgeries    Home Medications:  Allergies  as of 04/05/2017   No Known Allergies     Medication List       Accurate as of 04/05/17  9:08 AM. Always use your most recent med list.          amLODipine 2.5 MG tablet Commonly known as:  NORVASC Take 1 tablet (2.5 mg total) by mouth daily.   aspirin EC 81 MG tablet Take by mouth.   azithromycin 250 MG tablet Commonly known as:  ZITHROMAX Take two tablets x 1 day and then one tablet per day for four more days.   B-12 PO Take by mouth.   finasteride 5 MG tablet Commonly known as:  PROSCAR Take 1 tablet (5 mg total) by mouth daily.   FISH OIL PO Take by mouth.   Fish Oil 1000 MG Caps Take by mouth.   MULTI-VITAMINS Tabs Take by mouth.   omeprazole 20 MG capsule Commonly known as:  PRILOSEC TAKE ONE CAPSULE BY MOUTH ONCE DAILY   simvastatin 20 MG tablet Commonly known as:  ZOCOR Take 1 tablet (20 mg total) by mouth  at bedtime.   tamsulosin 0.4 MG Caps capsule Commonly known as:  FLOMAX Take 1 capsule (0.4 mg total) by mouth daily.       Allergies: No Known Allergies  Family History: Family History  Problem Relation Age of Onset  . Arthritis Mother   . Heart disease Mother     CHF  . Cancer Father     Prostate  . Dementia Father   . Kidney cancer Neg Hx   . Bladder Cancer Neg Hx     Social History:  reports that he has never smoked. He has never used smokeless tobacco. He reports that he drinks alcohol. He reports that he does not use drugs.  ROS: UROLOGY Frequent Urination?: Yes Hard to postpone urination?: Yes Burning/pain with urination?: No Get up at night to urinate?: Yes Leakage of urine?: Yes Urine stream starts and stops?: No Trouble starting stream?: No Do you have to strain to urinate?: No Blood in urine?: No Urinary tract infection?: No Sexually transmitted disease?: No Injury to kidneys or bladder?: No Painful intercourse?: No Weak stream?: No Erection problems?: Yes Penile pain?: No  Gastrointestinal Nausea?: No Vomiting?: No Indigestion/heartburn?: No Diarrhea?: No Constipation?: No  Constitutional Fever: No Night sweats?: No Weight loss?: No Fatigue?: No  Skin Skin rash/lesions?: No Itching?: No  Eyes Blurred vision?: No Double vision?: No  Ears/Nose/Throat Sore throat?: No Sinus problems?: No  Hematologic/Lymphatic Swollen glands?: No Easy bruising?: No  Cardiovascular Leg swelling?: No Chest pain?: No  Respiratory Cough?: No Shortness of breath?: No  Endocrine Excessive thirst?: No  Musculoskeletal Back pain?: No Joint pain?: No  Neurological Headaches?: No Dizziness?: No  Psychologic Depression?: No Anxiety?: No  Physical Exam: BP (!) 172/79   Pulse 82   Ht 6' (1.829 m)   Wt 232 lb 6.4 oz (105.4 kg)   BMI 31.52 kg/m   Constitutional: Well nourished. Alert and oriented, No acute distress. HEENT: [place] AT,  moist mucus membranes. Trachea midline, no masses. Cardiovascular: No clubbing, cyanosis, or edema. Respiratory: Normal respiratory effort, no increased work of breathing. GI: Abdomen is soft, non tender, non distended, no abdominal masses. Liver and spleen not palpable.  No hernias appreciated.  Stool sample for occult testing is not indicated.   GU: No CVA tenderness.  No bladder fullness or masses.  Patient with uncircumcised phallus.  Foreskin easily retracted.   Urethral meatus is patent.  No penile discharge. No penile lesions or rashes. Scrotum without lesions, cysts, rashes and/or edema.  Testicles are located scrotally bilaterally. No masses are appreciated in the testicles. Left and right epididymis are normal. Rectal: Patient with  normal sphincter tone. Anus and perineum without scarring or rashes. No rectal masses are appreciated. Prostate is approximately 60 grams, no nodules are appreciated. Seminal vesicles are normal. Skin: No rashes, bruises or suspicious lesions. Lymph: No cervical or inguinal adenopathy. Neurologic: Grossly intact, no focal deficits, moving all 4 extremities. Psychiatric: Normal mood and affect.  Laboratory Data: Lab Results  Component Value Date   WBC 6.7 10/26/2016   HGB 14.1 10/26/2016   HCT 42.3 10/26/2016   MCV 93.6 10/26/2016   PLT 219.0 10/26/2016    Lab Results  Component Value Date   CREATININE 0.94 10/26/2016    Lab Results  Component Value Date   PSA 2.50 02/21/2015     Lab Results  Component Value Date   TSH 2.02 01/02/2016       Component Value Date/Time   CHOL 167 10/26/2016 0836   HDL 57.00 10/26/2016 0836   CHOLHDL 3 10/26/2016 0836   VLDL 26.8 10/26/2016 0836   LDLCALC 83 10/26/2016 0836    Lab Results  Component Value Date   AST 17 10/26/2016   Lab Results  Component Value Date   ALT 14 10/26/2016     Pertinent Imaging: Results for TAQUAN, BRALLEY (MRN [id number]) as of 04/05/2017 08:53  Ref. Range  04/05/2017 08:37  Scan Result Unknown 0    Assessment & Plan:    1. BPH with LUTS  - IPSS score is 12/2, it is stable/improving/worsening  - Continue conservative management, avoiding bladder irritants and timed voiding's  - most bothersome symptoms is/are Urge incontinence-we discussed seeking a PT evaluation and/or starting the medication but patient declines at this time and would like to continue to manage it conservatively  - Continue tamsulosin 0.4 mg daily and finasteride 5 mg daily; refills given  - RTC in 12 months for IPSS and exam   2. Erectile dysfunction  - Patient will like to try Viagra samples, he is not taking nitroglycerin therapy and has good cardiac status  -Given Viagra 100 mg samples, #2-advised to take them 2 hours prior to intercourse on an empty stomach, advised of the side effects  - RTC in one for SHIM    Return in about 1 year (around 04/05/2018) for IPSS, SHIM and exam.  These notes generated with voice recognition software. I apologize for typographical errors.  Zara Council, [facility] [address] [place], [place] [address] [phone]

## 2017-04-05 ENCOUNTER — Encounter: Payer: Self-pay | Admitting: Urology

## 2017-04-05 ENCOUNTER — Ambulatory Visit (INDEPENDENT_AMBULATORY_CARE_PROVIDER_SITE_OTHER): Payer: PPO | Admitting: Urology

## 2017-04-05 VITALS — BP 172/79 | HR 82 | Ht 72.0 in | Wt 232.4 lb

## 2017-04-05 DIAGNOSIS — N401 Enlarged prostate with lower urinary tract symptoms: Secondary | ICD-10-CM | POA: Diagnosis not present

## 2017-04-05 DIAGNOSIS — N529 Male erectile dysfunction, unspecified: Secondary | ICD-10-CM | POA: Diagnosis not present

## 2017-04-05 DIAGNOSIS — N138 Other obstructive and reflux uropathy: Secondary | ICD-10-CM | POA: Diagnosis not present

## 2017-04-05 LAB — BLADDER SCAN AMB NON-IMAGING: Scan Result: 0

## 2017-04-05 MED ORDER — TAMSULOSIN HCL 0.4 MG PO CAPS: 0.4000 mg | ORAL_CAPSULE | Freq: Every day | ORAL | 3 refills | Status: DC

## 2017-04-05 MED ORDER — FINASTERIDE 5 MG PO TABS: 5.0000 mg | ORAL_TABLET | Freq: Every day | ORAL | 3 refills | Status: DC

## 2017-04-05 NOTE — Patient Instructions (Addendum)
Sildenafil tablets (Viagra) What is this medicine? SILDENAFIL (sil DEN a fil) is used to treat erection problems in men. This medicine may be used for other purposes; ask your health care provider or pharmacist if you have questions. COMMON BRAND NAME(S): Viagra What should I tell my health care provider before I take this medicine? They need to know if you have any of these conditions: -bleeding disorders -eye or vision problems, including a rare inherited eye disease called retinitis pigmentosa -anatomical deformation of the penis, Peyronie's disease, or history of priapism (painful and prolonged erection) -heart disease, angina, a history of heart attack, irregular heart beats, or other heart problems -high or low blood pressure -history of blood diseases, like sickle cell anemia or leukemia -history of stomach bleeding -kidney disease -liver disease -stroke -an unusual or allergic reaction to sildenafil, other medicines, foods, dyes, or preservatives -pregnant or trying to get pregnant -breast-feeding How should I use this medicine? Take this medicine by mouth with a glass of water. Follow the directions on the prescription label. The dose is usually taken 1 hour before sexual activity. You should not take the dose more than once per day. Do not take your medicine more often than directed. Talk to your pediatrician regarding the use of this medicine in children. This medicine is not used in children for this condition. Overdosage: If you think you have taken too much of this medicine contact a poison control center or emergency room at once. NOTE: This medicine is only for you. Do not share this medicine with others. What if I miss a dose? This does not apply. Do not take double or extra doses. What may interact with this medicine? Do not take this medicine with any of the following medications: -cisapride -nitrates like amyl nitrite, isosorbide dinitrate, isosorbide mononitrate,  nitroglycerin -riociguat This medicine may also interact with the following medications: -antiviral medicines for HIV or AIDS -bosentan -certain medicines for benign prostatic hyperplasia (BPH) -certain medicines for blood pressure -certain medicines for fungal infections like ketoconazole and itraconazole -cimetidine -erythromycin -rifampin This list may not describe all possible interactions. Give your health care provider a list of all the medicines, herbs, non-prescription drugs, or dietary supplements you use. Also tell them if you smoke, drink alcohol, or use illegal drugs. Some items may interact with your medicine. What should I watch for while using this medicine? If you notice any changes in your vision while taking this drug, call your doctor or health care professional as soon as possible. Stop using this medicine and call your health care provider right away if you have a loss of sight in one or both eyes. Contact your doctor or health care professional right away if you have an erection that lasts longer than 4 hours or if it becomes painful. This may be a sign of a serious problem and must be treated right away to prevent permanent damage. If you experience symptoms of nausea, dizziness, chest pain or arm pain upon initiation of sexual activity after taking this medicine, you should refrain from further activity and call your doctor or health care professional as soon as possible. Do not drink alcohol to excess (examples, 5 glasses of wine or 5 shots of whiskey) when taking this medicine. When taken in excess, alcohol can increase your chances of getting a headache or getting dizzy, increasing your heart rate or lowering your blood pressure. Using this medicine does not protect you or your partner against HIV infection (the virus that causes  AIDS) or other sexually transmitted diseases. What side effects may I notice from receiving this medicine? Side effects that you should report  to your doctor or health care professional as soon as possible: -allergic reactions like skin rash, itching or hives, swelling of the face, lips, or tongue -breathing problems -changes in hearing -changes in vision -chest pain -fast, irregular heartbeat -prolonged or painful erection -seizures Side effects that usually do not require medical attention (report to your doctor or health care professional if they continue or are bothersome): -back pain -dizziness -flushing -headache -indigestion -muscle aches -nausea -stuffy or runny nose This list may not describe all possible side effects. Call your doctor for medical advice about side effects. You may report side effects to FDA at 1-800-FDA-1088. Where should I keep my medicine? Keep out of reach of children. Store at room temperature between 15 and 30 degrees C (59 and 86 degrees F). Throw away any unused medicine after the expiration date. NOTE: This sheet is a summary. It may not cover all possible information. If you have questions about this medicine, talk to your doctor, pharmacist, or health care provider.  2018 Elsevier/Gold Standard (2015-11-19 12:00:25)  

## 2017-04-18 ENCOUNTER — Other Ambulatory Visit: Payer: Self-pay

## 2017-04-18 ENCOUNTER — Telehealth: Payer: Self-pay

## 2017-04-18 DIAGNOSIS — E785 Hyperlipidemia, unspecified: Secondary | ICD-10-CM

## 2017-04-18 MED ORDER — SIMVASTATIN 20 MG PO TABS: 20.0000 mg | ORAL_TABLET | Freq: Every day | ORAL | 0 refills | Status: DC

## 2017-04-18 NOTE — Telephone Encounter (Signed)
App made refill called in patient informed

## 2017-04-18 NOTE — Telephone Encounter (Signed)
Fax received from [address] for refill of zocor. Patient will need f/u appointment made.

## 2017-06-27 ENCOUNTER — Other Ambulatory Visit: Payer: Self-pay | Admitting: Internal Medicine

## 2017-07-19 ENCOUNTER — Encounter: Payer: Self-pay | Admitting: Internal Medicine

## 2017-07-19 ENCOUNTER — Ambulatory Visit (INDEPENDENT_AMBULATORY_CARE_PROVIDER_SITE_OTHER): Payer: PPO | Admitting: Internal Medicine

## 2017-07-19 DIAGNOSIS — R1310 Dysphagia, unspecified: Secondary | ICD-10-CM | POA: Diagnosis not present

## 2017-07-19 DIAGNOSIS — R3911 Hesitancy of micturition: Secondary | ICD-10-CM

## 2017-07-19 DIAGNOSIS — I10 Essential (primary) hypertension: Secondary | ICD-10-CM | POA: Diagnosis not present

## 2017-07-19 DIAGNOSIS — N400 Enlarged prostate without lower urinary tract symptoms: Secondary | ICD-10-CM

## 2017-07-19 DIAGNOSIS — E785 Hyperlipidemia, unspecified: Secondary | ICD-10-CM

## 2017-07-19 LAB — HEPATIC FUNCTION PANEL
ALT: 17 U/L (ref 0–53)
AST: 20 U/L (ref 0–37)
Albumin: 4.3 g/dL (ref 3.5–5.2)
Alkaline Phosphatase: 75 U/L (ref 39–117)
Bilirubin, Direct: 0.1 mg/dL (ref 0.0–0.3)
Total Bilirubin: 0.8 mg/dL (ref 0.2–1.2)
Total Protein: 6.7 g/dL (ref 6.0–8.3)

## 2017-07-19 LAB — BASIC METABOLIC PANEL
BUN: 14 mg/dL (ref 6–23)
CO2: 27 mEq/L (ref 19–32)
Calcium: 9.3 mg/dL (ref 8.4–10.5)
Chloride: 106 mEq/L (ref 96–112)
Creatinine, Ser: 0.93 mg/dL (ref 0.40–1.50)
GFR: 83.23 mL/min (ref >60.00)
Glucose, Bld: 103 mg/dL — ABNORMAL HIGH (ref 70–99)
Potassium: 4.4 mEq/L (ref 3.5–5.1)
Sodium: 141 mEq/L (ref 135–145)

## 2017-07-19 LAB — LIPID PANEL
Cholesterol: 161 mg/dL (ref 0–200)
HDL: 52.2 mg/dL (ref >39.00)
LDL Cholesterol: 72 mg/dL (ref 0–99)
NonHDL: 108.41
Total CHOL/HDL Ratio: 3
Triglycerides: 181 mg/dL — ABNORMAL HIGH (ref 0.0–149.0)
VLDL: 36.2 mg/dL (ref 0.0–40.0)

## 2017-07-19 LAB — PSA, MEDICARE: PSA: 0.92 ng/ml (ref 0.10–4.00)

## 2017-07-19 LAB — TSH: TSH: 3.53 u[IU]/mL (ref 0.35–4.50)

## 2017-07-19 MED ORDER — OMEPRAZOLE 20 MG PO CPDR: 20.0000 mg | DELAYED_RELEASE_CAPSULE | Freq: Every day | ORAL | 1 refills | Status: DC

## 2017-07-19 MED ORDER — SIMVASTATIN 20 MG PO TABS: 20.0000 mg | ORAL_TABLET | Freq: Every day | ORAL | 1 refills | Status: DC

## 2017-07-19 NOTE — Assessment & Plan Note (Signed)
On simvastatin.  Low cholesterol diet and exercise.  Follow lipid panel and liver function tests.   

## 2017-07-19 NOTE — Progress Notes (Signed)
Pre-visit discussion using our clinic review tool. No additional management support is needed unless otherwise documented below in the visit note.  

## 2017-07-19 NOTE — Assessment & Plan Note (Signed)
Followed by urology.  Last evaluated 04/05/17.  Felt stable.  Recommended f/u in one year.  Elected to continue current regimen.

## 2017-07-19 NOTE — Assessment & Plan Note (Signed)
Blood pressure under good control.  Continue same medication regimen.  Follow pressures.  Follow metabolic panel.   

## 2017-07-19 NOTE — Progress Notes (Signed)
Patient ID: Brian Pearson, male   DOB: 09-01-1936, 81 y.o.   MRN: [id number]   Subjective:    Patient ID: Brian Pearson, male    DOB: Jul 12, 1936, 81 y.o.   MRN: [id number]  HPI  Patient here for a scheduled follow up.  States he is doing well.  Feels good.  Exercises.  No chest pain.  No sob.  No acid reflux.  Takes omeprazole and does not need daily.  No trouble swallowing.  No abdominal pain.  Bowels moving.  Overall feels good.  Saw urology in April.  Stable.     Past Medical History:  Diagnosis Date  . Arthritis   . BPH with obstruction/lower urinary tract symptoms   . GERD (gastroesophageal reflux disease)   . Hyperlipidemia    taken off simvastatin last year  . Hypertension   . Obesity   . Pneumonia    hospitalized   . Urinary frequency   . Urine incontinence    Past Surgical History:  Procedure Laterality Date  . COLONOSCOPY WITH PROPOFOL N/A 02/09/2016   Procedure: COLONOSCOPY WITH PROPOFOL;  Surgeon: Hulen Luster, MD;  Location: [facility] ENDOSCOPY;  Service: Gastroenterology;  Laterality: N/A;  . ESOPHAGOGASTRODUODENOSCOPY (EGD) WITH PROPOFOL N/A 02/09/2016   Procedure: ESOPHAGOGASTRODUODENOSCOPY (EGD) WITH PROPOFOL;  Surgeon: Hulen Luster, MD;  Location: [facility] ENDOSCOPY;  Service: Gastroenterology;  Laterality: N/A;  . HERNIA REPAIR     1990's - 3 surgeries   Family History  Problem Relation Age of Onset  . Arthritis Mother   . Heart disease Mother        CHF  . Cancer Father        Prostate  . Dementia Father   . Kidney cancer Neg Hx   . Bladder Cancer Neg Hx    Social History   Social History  . Marital status: Married    Spouse name: N/A  . Number of children: N/A  . Years of education: N/A   Social History Main Topics  . Smoking status: Never Smoker  . Smokeless tobacco: Never Used  . Alcohol use 0.0 oz/week     Comment: OCC glass of wine  . Drug use: No  . Sexual activity: Yes   Other Topics Concern  . None   Social History Narrative   Lives in  [place].      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 amLODipine (NORVASC) 2.5 MG tablet TAKE ONE TABLET BY MOUTH ONCE DAILY  . aspirin EC 81 MG tablet Take by mouth.  . finasteride (PROSCAR) 5 MG tablet Take 1 tablet (5 mg total) by mouth daily.  . Multiple Vitamin (MULTI-VITAMINS) TABS Take by mouth.  . Omega-3 Fatty Acids (FISH OIL PO) Take by mouth.  Marland Kitchen omeprazole (PRILOSEC) 20 MG capsule Take 1 capsule (20 mg total) by mouth daily.  . simvastatin (ZOCOR) 20 MG tablet Take 1 tablet (20 mg total) by mouth at bedtime.  . tamsulosin (FLOMAX) 0.4 MG CAPS capsule Take 1 capsule (0.4 mg total) by mouth daily.  . [DISCONTINUED] omeprazole (PRILOSEC) 20 MG capsule TAKE ONE CAPSULE BY MOUTH ONCE DAILY  . [DISCONTINUED] simvastatin (ZOCOR) 20 MG tablet Take 1  tablet (20 mg total) by mouth at bedtime.  . Cyanocobalamin (B-12 PO) Take by mouth.  . [DISCONTINUED] azithromycin (ZITHROMAX) 250 MG tablet Take two tablets x 1 day and then one tablet per day for four more days. (Patient not taking: Reported on 04/05/2017)  . [DISCONTINUED] Omega-3 Fatty Acids (FISH OIL) 1000 MG CAPS Take by mouth.   No facility-administered encounter medications on file as of 07/19/2017.     Review of Systems  Constitutional: Negative for appetite change and unexpected weight change.  HENT: Negative for congestion and sinus pressure.   Respiratory: Negative for cough, chest tightness and shortness of breath.   Cardiovascular: Negative for chest pain, palpitations and leg swelling.  Gastrointestinal: Negative for abdominal pain, diarrhea, nausea and vomiting.  Genitourinary: Negative for difficulty urinating and dysuria.  Musculoskeletal: Negative for back pain and joint swelling.  Skin: Negative for color  change and rash.  Neurological: Negative for dizziness, light-headedness and headaches.  Psychiatric/Behavioral: Negative for agitation and dysphoric mood.       Objective:     Blood pressure rechecked by me:  128/68  Physical Exam  Constitutional: He appears well-developed and well-nourished. No distress.  HENT:  Nose: Nose normal.  Mouth/Throat: Oropharynx is clear and moist.  Neck: Neck supple. No thyromegaly present.  Cardiovascular: Normal rate and regular rhythm.   Pulmonary/Chest: Effort normal and breath sounds normal. No respiratory distress.  Abdominal: Soft. Bowel sounds are normal. There is no tenderness.  Musculoskeletal: He exhibits no edema or tenderness.  Lymphadenopathy:    He has no cervical adenopathy.  Skin: No rash noted. No erythema.  Psychiatric: He has a normal mood and affect. His behavior is normal.    BP 130/62 (BP Location: Left Arm, Patient Position: Sitting, Cuff Size: Normal)   Pulse 64   Temp 97.9 F (36.6 C) (Oral)   Resp 12   Ht 6' (1.829 m)   Wt 233 lb 3.2 oz (105.8 kg)   SpO2 95%   BMI 31.63 kg/m  Wt Readings from Last 3 Encounters:  07/19/17 233 lb 3.2 oz (105.8 kg)  04/05/17 232 lb 6.4 oz (105.4 kg)  10/06/16 220 lb 6.4 oz (100 kg)     Lab Results  Component Value Date   WBC 6.7 10/26/2016   HGB 14.1 10/26/2016   HCT 42.3 10/26/2016   PLT 219.0 10/26/2016   GLUCOSE 102 (H) 10/26/2016   CHOL 167 10/26/2016   TRIG 134.0 10/26/2016   HDL 57.00 10/26/2016   LDLCALC 83 10/26/2016   ALT 14 10/26/2016   AST 17 10/26/2016   NA 139 10/26/2016   K 4.1 10/26/2016   CL 107 10/26/2016   CREATININE 0.94 10/26/2016   BUN 15 10/26/2016   CO2 27 10/26/2016   TSH 2.02 01/02/2016   PSA 2.50 02/21/2015   MICROALBUR 0.7 10/22/2014       Assessment & Plan:   Problem List Items Addressed This Visit    Benign prostatic hyperplasia without urinary obstruction    Followed by urology.       Relevant Orders   PSA, Medicare    Dysphagia   Relevant Medications   omeprazole (PRILOSEC) 20 MG capsule   Essential hypertension    Blood pressure under good control.  Continue same medication regimen.  Follow pressures.  Follow metabolic panel.        Relevant Medications   simvastatin (ZOCOR) 20 MG tablet   Other Relevant Orders   TSH   Basic metabolic panel  HLD (hyperlipidemia)   Relevant Medications   simvastatin (ZOCOR) 20 MG tablet   Other Relevant Orders   Lipid panel   Hepatic function panel   Urinary hesitancy    Followed by urology.  Last evaluated 04/05/17.  Felt stable.  Recommended f/u in one year.  Elected to continue current regimen.            [name], MD

## 2017-07-19 NOTE — Assessment & Plan Note (Signed)
Followed by urology.   

## 2017-08-09 DIAGNOSIS — L814 Other melanin hyperpigmentation: Secondary | ICD-10-CM | POA: Diagnosis not present

## 2017-08-09 DIAGNOSIS — L578 Other skin changes due to chronic exposure to nonionizing radiation: Secondary | ICD-10-CM | POA: Diagnosis not present

## 2017-08-09 DIAGNOSIS — L821 Other seborrheic keratosis: Secondary | ICD-10-CM | POA: Diagnosis not present

## 2017-08-09 DIAGNOSIS — L718 Other rosacea: Secondary | ICD-10-CM | POA: Diagnosis not present

## 2017-08-30 ENCOUNTER — Ambulatory Visit (INDEPENDENT_AMBULATORY_CARE_PROVIDER_SITE_OTHER): Payer: PPO

## 2017-08-30 VITALS — BP 138/64 | HR 61 | Temp 97.7°F | Resp 14 | Ht 72.0 in | Wt 232.1 lb

## 2017-08-30 DIAGNOSIS — Z1389 Encounter for screening for other disorder: Secondary | ICD-10-CM

## 2017-08-30 DIAGNOSIS — Z1331 Encounter for screening for depression: Secondary | ICD-10-CM

## 2017-08-30 DIAGNOSIS — Z23 Encounter for immunization: Secondary | ICD-10-CM

## 2017-08-30 DIAGNOSIS — Z0000 Encounter for general adult medical examination without abnormal findings: Secondary | ICD-10-CM

## 2017-08-30 NOTE — Progress Notes (Signed)
Subjective:   Brian Pearson is a 81 y.o. male who presents for Medicare Annual/Subsequent preventive examination.  Review of Systems:  No ROS.  Medicare Wellness Visit. Additional risk factors are reflected in the social history.  Cardiac Risk Factors include: advanced age (>57men, >87 women);male gender;hypertension     Objective:    Vitals: BP 138/64 (BP Location: Left Arm, Patient Position: Sitting, Cuff Size: Normal)   Pulse 61   Temp 97.7 F (36.5 C) (Oral)   Resp 14   Ht 6' (1.829 m)   Wt 232 lb 1.9 oz (105.3 kg)   SpO2 95%   BMI 31.48 kg/m   Body mass index is 31.48 kg/m.  Tobacco History  Smoking Status  . Never Smoker  Smokeless Tobacco  . Never Used     Counseling given: Not Answered   Past Medical History:  Diagnosis Date  . Arthritis   . BPH with obstruction/lower urinary tract symptoms   . GERD (gastroesophageal reflux disease)   . Hyperlipidemia    taken off simvastatin last year  . Hypertension   . Obesity   . Pneumonia    hospitalized   . Urinary frequency   . Urine incontinence    Past Surgical History:  Procedure Laterality Date  . COLONOSCOPY WITH PROPOFOL N/A 02/09/2016   Procedure: COLONOSCOPY WITH PROPOFOL;  Surgeon: Hulen Luster, MD;  Location[facility] ENDOSCOPY;  Service: Gastroenterology;  Laterality: N/A;  . ESOPHAGOGASTRODUODENOSCOPY (EGD) WITH PROPOFOL N/A 02/09/2016   Procedure: ESOPHAGOGASTRODUODENOSCOPY (EGD) WITH PROPOFOL;  Surgeon: Hulen Luster, MD;  Location: [facility] ENDOSCOPY;  Service: Gastroenterology;  Laterality: N/A;  . HERNIA REPAIR     1990's - 3 surgeries   Family History  Problem Relation Age of Onset  . Arthritis Mother   . Heart disease Mother        CHF  . Cancer Father        Prostate  . Dementia Father   . Hypotension Sister   . Kidney cancer Neg Hx   . Bladder Cancer Neg Hx    History  Sexual Activity  . Sexual activity: Yes    Outpatient Encounter Prescriptions as of 08/30/2017  Medication Sig  .  amLODipine (NORVASC) 2.5 MG tablet TAKE ONE TABLET BY MOUTH ONCE DAILY  . aspirin EC 81 MG tablet Take by mouth.  . Cholecalciferol (VITAMIN D-3) 1000 units CAPS Take 1 capsule by mouth.  . Cyanocobalamin (B-12 PO) Take by mouth.  . finasteride (PROSCAR) 5 MG tablet Take 1 tablet (5 mg total) by mouth daily.  . Multiple Vitamin (MULTI-VITAMINS) TABS Take by mouth.  . Omega-3 Fatty Acids (FISH OIL PO) Take by mouth.  Marland Kitchen omeprazole (PRILOSEC) 20 MG capsule Take 1 capsule (20 mg total) by mouth daily.  . simvastatin (ZOCOR) 20 MG tablet Take 1 tablet (20 mg total) by mouth at bedtime.  . tamsulosin (FLOMAX) 0.4 MG CAPS capsule Take 1 capsule (0.4 mg total) by mouth daily.   No facility-administered encounter medications on file as of 08/30/2017.     Activities of Daily Living In your present state of health, do you have any difficulty performing the following activities: 08/30/2017 08/30/2016  Hearing? Y Y  Comment - Bilateral hearing aids  Vision? N N  Difficulty concentrating or making decisions? N N  Walking or climbing stairs? N N  Dressing or bathing? N N  Doing errands, shopping? N N  Preparing Food and eating ? N N  Using the Toilet? N N  In the past six months, have you accidently leaked urine? Y N  Comment Followed by Urologist, Natasha Mead, PA. Stable and followed by [place]  Do you have problems with loss of bowel control? N N  Managing your Medications? N N  Managing your Finances? N N  Housekeeping or managing your Housekeeping? N N  Some recent data might be hidden    Patient Care Team: [name], MD as PCP - General (Internal Medicine)   Assessment:    This is a routine wellness examination for Brian Pearson. The goal of the wellness visit is to assist the patient how to close the gaps in care and create a preventative care plan for the patient.   The roster of all physicians providing medical care to patient is listed in the Snapshot  section of the chart.  Taking calcium VIT D as appropriate/Osteoporosis risk reviewed.    Safety issues reviewed; Smoke and carbon monoxide detectors in the home. No firearms in the home.  Wears seatbelts when driving or riding with others. Patient does wear sunscreen or protective clothing when in direct sunlight. No violence in the home.  Depression- PHQ 2 &9 complete.  No signs/symptoms or verbal communication regarding little pleasure in doing things, feeling down, depressed or hopeless. No changes in sleeping, energy, eating, concentrating.  No thoughts of self harm or harm towards others.  Time spent on this topic is 8 minutes.   Patient is alert, normal appearance, oriented to person/place/and time.  Correctly identified the president of the [place], recall of 3/3 words, and performing simple calculations. Displays appropriate judgement and can read correct time from watch face.   No new identified risk were noted.  No failures at ADL's or IADL's.    BMI- discussed the importance of a healthy diet, water intake and the benefits of aerobic exercise. Educational material provided.   24 hour diet recall: Breakfast: sausage biscuit  Lunch: deli sandwich Dinner: chicken, mashed potatoes, roll  Daily fluid intake: 1 cups of caffeine,  4 cups of water  Dental- upper dental plate.  Eye- Visual acuity not assessed per patient preference since they have regular follow up with the ophthalmologist. Wears corrective lenses.  Sleep patterns- Sleeps 8 hours at night.  Wakes feeling rested.  High dose influenza vaccine administered R deltoid, tolerated well. Educational material provided.  Health maintenance gaps- closed.  Patient Concerns: None at this time. Follow up with PCP as needed.  Exercise Activities and Dietary recommendations Current Exercise Habits: Home exercise routine, Type of exercise: calisthenics;stretching;strength training/weights, Time (Minutes): 30, Frequency  (Times/Week): 5, Weekly Exercise (Minutes/Week): 150, Intensity: Mild  Goals    . Healthy [place]          Low carb foods Increase physical activity with walking for exercise Stay hydrated, drink plenty of fluids      Fall Risk Fall Risk  08/30/2017 10/06/2016 08/30/2016 08/28/2015 02/21/2015  Falls in the past year? No No No No No   Depression Screen PHQ 2/9 Scores 08/30/2017 07/19/2017 10/06/2016 08/30/2016  PHQ - 2 Score 0 0 0 0  PHQ- 9 Score 0 0 - -    Cognitive Function MMSE - Mini Mental State Exam 08/30/2016 08/28/2015  Orientation to time 5 5  Orientation to Place 5 5  Registration 3 3  Attention/ Calculation 5 5  Recall 3 3  Language- name 2 objects 2 2  Language- repeat 1 1  Language- follow 3 step command 3 3  Language- read &  follow direction 1 1  Write a sentence 1 1  Copy design 1 1  Total score 30 30     6CIT Screen 08/30/2017  What Year? 0 points  What month? 0 points  What time? 0 points  Count back from 20 0 points  Months in reverse 0 points  Repeat phrase 0 points  Total Score 0    Immunization History  Administered Date(s) Administered  . Influenza, High Dose Seasonal PF 08/28/2015, 08/30/2016, 08/30/2017  . Influenza-Unspecified 09/21/2014  . Pneumococcal Conjugate-13 10/22/2014  . Pneumococcal-Unspecified 10/23/2011  . Tdap 08/28/2015   Screening Tests Health Maintenance  Topic Date Due  . TETANUS/TDAP  08/27/2025  . INFLUENZA VACCINE  Completed  . PNA vac Low Risk Adult  Completed      Plan:    End of life planning; Advance aging; Advanced directives discussed. Copy of current HCPOA/Living Will requested.    I have personally reviewed and noted the following in the patient's chart:   . Medical and social history . Use of alcohol, tobacco or illicit drugs  . Current medications and supplements . Functional ability and status . Nutritional status . Physical activity . Advanced directives . List of other  physicians . Hospitalizations, surgeries, and ER visits in previous 12 months . Vitals . Screenings to include cognitive, depression, and falls . Referrals and appointments  In addition, I have reviewed and discussed with patient certain preventive protocols, quality metrics, and best practice recommendations. A written personalized care plan for preventive services as well as general preventive health recommendations were provided to patient.     Varney Biles, LPN  4/00/8676   Reviewed above information.  Agree with assessment and plan.  Dr Nicki Reaper

## 2017-08-30 NOTE — Patient Instructions (Addendum)
  Brian Pearson , Thank you for taking time to come for your Medicare Wellness Visit. I appreciate your ongoing commitment to your health goals. Please review the following plan we discussed and let me know if I can assist you in the future.   Follow up with Dr. Nicki Reaper as needed.    Have a great day!  These are the goals we discussed: Goals    . Healthy Lifrestyle          Low carb foods Increase physical activity with walking for exercise Stay hydrated, drink plenty of fluids       This is a list of the screening recommended for you and due dates:  Health Maintenance  Topic Date Due  . Tetanus Vaccine  08/27/2025  . Flu Shot  Completed  . Pneumonia vaccines  Completed

## 2017-12-24 ENCOUNTER — Other Ambulatory Visit: Payer: Self-pay | Admitting: Internal Medicine

## 2018-02-07 ENCOUNTER — Other Ambulatory Visit: Payer: Self-pay | Admitting: Internal Medicine

## 2018-02-07 DIAGNOSIS — E785 Hyperlipidemia, unspecified: Secondary | ICD-10-CM

## 2018-03-22 ENCOUNTER — Other Ambulatory Visit: Payer: Self-pay | Admitting: Internal Medicine

## 2018-03-22 DIAGNOSIS — E785 Hyperlipidemia, unspecified: Secondary | ICD-10-CM

## 2018-04-05 ENCOUNTER — Ambulatory Visit: Payer: PPO | Admitting: Urology

## 2018-04-17 ENCOUNTER — Other Ambulatory Visit: Payer: Self-pay | Admitting: Urology

## 2018-04-17 DIAGNOSIS — N401 Enlarged prostate with lower urinary tract symptoms: Secondary | ICD-10-CM

## 2018-05-01 NOTE — Progress Notes (Signed)
05/03/2018 9:55 AM   Brian Pearson Dec 29, 1935 [id number]  Referring provider: [name], MD [address] [place], [place] [address]  Chief Complaint  Patient presents with  . Benign Prostatic Hypertrophy    HPI: 82 yo WM with BPH with LU TS and ED who presents today for a one year follow up.  BPH WITH LUTS His IPSS score today is 23, which is severe lower urinary tract symptomatology.   He is mixed with his quality life due to his urinary symptoms.  His previous I PSS score was 12/2.  His previous PVR is 0 mL.  His major complaints today are frequency, urgency, nocturia x 3 and urge incontinence.  He has had these symptoms for several years.  He denies any dysuria, hematuria or suprapubic pain.  He currently taking tamsulosin 0.4 mg and finasteride 5 mg daily.  He also denies any recent fevers, chills, nausea or vomiting.  Patient's biggest complaint is still urge incontinence. He states it occurs during the day and during the night. He sometimes wears pads especially if he is going on outings.  He does not like to be far from bathroom.    IPSS    Row Name 05/03/18 0900         International Prostate Symptom Score   How often have you had the sensation of not emptying your bladder?  Less than half the time     How often have you had to urinate less than every two hours?  More than half the time     How often have you found you stopped and started again several times when you urinated?  Almost always     How often have you found it difficult to postpone urination?  More than half the time     How often have you had a weak urinary stream?  More than half the time     How often have you had to strain to start urination?  Less than 1 in 5 times     How many times did you typically get up at night to urinate?  3 Times     Total IPSS Score  23       Quality of Life due to urinary symptoms   If you were to spend the rest of your life with your urinary  condition just the way it is now how would you feel about that?  Mixed        Score:  1-7 Mild 8-19 Moderate 20-35 Severe  Erectile dysfunction His SHIM score is 5, which is severe ED.   He has been having difficulty with erections for several years.   His risk factors for ED are age, BPH, HTN and HLD.  He denies any painful erections or curvatures with his erections.   He is still  having spontaneous erections.  He has tried Viagra in the past, but he did not find it effective.     [place] Name 05/03/18 0930         SHIM: Over the last 6 months:   How do you rate your confidence that you could get and keep an erection?  Very Low     When you had erections with sexual stimulation, how often were your erections hard enough for penetration (entering your partner)?  Almost Never or Never     During sexual intercourse, how often were you able to maintain your erection after you had  penetrated (entered) your partner?  Almost Never or Never     During sexual intercourse, how difficult was it to maintain your erection to completion of intercourse?  Extremely Difficult     When you attempted sexual intercourse, how often was it satisfactory for you?  Almost Never or Never       SHIM Total Score   SHIM  5        Score: 1-7 Severe ED 8-11 Moderate ED 12-16 Mild-Moderate ED 17-21 Mild ED 22-25 No ED   PMH: Past Medical History:  Diagnosis Date  . Arthritis   . BPH with obstruction/lower urinary tract symptoms   . GERD (gastroesophageal reflux disease)   . Hyperlipidemia    taken off simvastatin last year  . Hypertension   . Obesity   . Pneumonia    hospitalized   . Urinary frequency   . Urine incontinence     Surgical History: Past Surgical History:  Procedure Laterality Date  . COLONOSCOPY WITH PROPOFOL N/A 02/09/2016   Procedure: COLONOSCOPY WITH PROPOFOL;  Surgeon: Hulen Luster, MD;  Location: [facility] ENDOSCOPY;  Service: Gastroenterology;  Laterality: N/A;  .  ESOPHAGOGASTRODUODENOSCOPY (EGD) WITH PROPOFOL N/A 02/09/2016   Procedure: ESOPHAGOGASTRODUODENOSCOPY (EGD) WITH PROPOFOL;  Surgeon: Hulen Luster, MD;  Location: [facility] ENDOSCOPY;  Service: Gastroenterology;  Laterality: N/A;  . HERNIA REPAIR     1990's - 3 surgeries    Home Medications:  Allergies as of 05/03/2018   No Known Allergies     Medication List        Accurate as of 05/03/18  9:55 AM. Always use your most recent med list.          amLODipine 2.5 MG tablet Commonly known as:  NORVASC TAKE 1 TABLET BY MOUTH ONCE DAILY   aspirin EC 81 MG tablet Take by mouth.   B-12 PO Take by mouth.   finasteride 5 MG tablet Commonly known as:  PROSCAR Take 1 tablet (5 mg total) by mouth daily.   FISH OIL PO Take by mouth.   MULTI-VITAMINS Tabs Take by mouth.   omeprazole 20 MG capsule Commonly known as:  PRILOSEC Take 1 capsule (20 mg total) by mouth daily.   simvastatin 20 MG tablet Commonly known as:  ZOCOR TAKE 1 TABLET BY MOUTH AT BEDTIME (NEEDS  APPOINTMENT  FOR  ADDITIONAL OR 90  DAY  REFILLS)   tamsulosin 0.4 MG Caps capsule Commonly known as:  FLOMAX TAKE 1 CAPSULE BY MOUTH ONCE DAILY   Vitamin D-3 1000 units Caps Take 1 capsule by mouth.       Allergies: No Known Allergies  Family History: Family History  Problem Relation Age of Onset  . Arthritis Mother   . Heart disease Mother        CHF  . Cancer Father        Prostate  . Dementia Father   . Hypotension Sister   . Kidney cancer Neg Hx   . Bladder Cancer Neg Hx     Social History:  reports that he has never smoked. He has never used smokeless tobacco. He reports that he drinks alcohol. He reports that he does not use drugs.  ROS: UROLOGY Frequent Urination?: Yes Hard to postpone urination?: Yes Burning/pain with urination?: No Get up at night to urinate?: Yes Leakage of urine?: Yes Urine stream starts and stops?: No Trouble starting stream?: No Do you have to strain to urinate?:  No Blood in urine?: No Urinary tract infection?:  No Sexually transmitted disease?: No Injury to kidneys or bladder?: No Painful intercourse?: No Weak stream?: No Erection problems?: Yes Penile pain?: No  Gastrointestinal Nausea?: No Vomiting?: No Indigestion/heartburn?: No Diarrhea?: No Constipation?: No  Constitutional Fever: No Night sweats?: No Weight loss?: No Fatigue?: No  Skin Skin rash/lesions?: No Itching?: Yes  Eyes Blurred vision?: No Double vision?: No  Ears/Nose/Throat Sore throat?: No Sinus problems?: No  Hematologic/Lymphatic Swollen glands?: No Easy bruising?: No  Cardiovascular Leg swelling?: No Chest pain?: No  Respiratory Cough?: No Shortness of breath?: No  Endocrine Excessive thirst?: No  Musculoskeletal Back pain?: No Joint pain?: No  Neurological Headaches?: No Dizziness?: No  Psychologic Depression?: No Anxiety?: No  Physical Exam: BP (!) 160/73 (BP Location: Right Arm, Patient Position: Sitting, Cuff Size: Large)   Pulse 88   Ht 6' (1.829 m)   Wt 232 lb 9.6 oz (105.5 kg)   SpO2 99%   BMI 31.55 kg/m   Constitutional: Well nourished. Alert and oriented, No acute distress. HEENT: [place] AT, moist mucus membranes. Trachea midline, no masses. Cardiovascular: No clubbing, cyanosis, or edema. Respiratory: Normal respiratory effort, no increased work of breathing. GI: Abdomen is soft, non tender, non distended, no abdominal masses. Liver and spleen not palpable.  No hernias appreciated.  Stool sample for occult testing is not indicated.   GU: No CVA tenderness.  No bladder fullness or masses.  Patient with uncircumcised phallus.  Foreskin easily retracted.  Urethral meatus is patent.  No penile discharge. No penile lesions or rashes. Scrotum without lesions, cysts, rashes and/or edema.  Testicles are located scrotally bilaterally. No masses are appreciated in the testicles. Left and right epididymis are normal. Rectal: Patient  with  normal sphincter tone. Anus and perineum without scarring or rashes. No rectal masses are appreciated. Prostate is approximately 60 grams, no nodules are appreciated. Seminal vesicles are normal. Skin: No rashes, bruises or suspicious lesions. Lymph: No cervical or inguinal adenopathy. Neurologic: Grossly intact, no focal deficits, moving all 4 extremities. Psychiatric: Normal mood and affect.   Laboratory Data: Lab Results  Component Value Date   WBC 6.7 10/26/2016   HGB 14.1 10/26/2016   HCT 42.3 10/26/2016   MCV 93.6 10/26/2016   PLT 219.0 10/26/2016    Lab Results  Component Value Date   CREATININE 0.93 07/19/2017    Lab Results  Component Value Date   PSA 0.92 07/19/2017   PSA 2.50 02/21/2015     Lab Results  Component Value Date   TSH 3.53 07/19/2017       Component Value Date/Time   CHOL 161 07/19/2017 0939   HDL 52.20 07/19/2017 0939   CHOLHDL 3 07/19/2017 0939   VLDL 36.2 07/19/2017 0939   LDLCALC 72 07/19/2017 0939    Lab Results  Component Value Date   AST 20 07/19/2017   Lab Results  Component Value Date   ALT 17 07/19/2017   I have reviewed the labs  Assessment & Plan:    1. BPH with LUTS  - IPSS score is 23/3, it is worsening  - Continue conservative management, avoiding bladder irritants and timed voiding's  - most bothersome symptoms is/are urge incontinence   - Continue tamsulosin 0.4 mg daily and finasteride 5 mg daily; refills given  - RTC in 12 months for IPSS and exam   2.  Urge incontinence Patient given Toviaz 4 mg samples side effects are discussed  Patient is instructed to call the office after he takes the 2 weeks  of samples if he desires a prescription or if he finds them ineffective  3. Erectile dysfunction He is not interested in pursuing treatments for erectile dysfunction at this time   Return in about 1 year (around 05/04/2019) for IPSS,and exam.  These notes generated with voice recognition software. I  apologize for typographical errors.  Zara Council, PA-C  [facility] [address] [place],  [address] [phone]

## 2018-05-02 ENCOUNTER — Other Ambulatory Visit: Payer: Self-pay | Admitting: Internal Medicine

## 2018-05-02 DIAGNOSIS — E785 Hyperlipidemia, unspecified: Secondary | ICD-10-CM

## 2018-05-03 ENCOUNTER — Encounter: Payer: Self-pay | Admitting: Urology

## 2018-05-03 ENCOUNTER — Ambulatory Visit: Payer: PPO | Admitting: Urology

## 2018-05-03 VITALS — BP 160/73 | HR 88 | Ht 72.0 in | Wt 232.6 lb

## 2018-05-03 DIAGNOSIS — N529 Male erectile dysfunction, unspecified: Secondary | ICD-10-CM

## 2018-05-03 DIAGNOSIS — N3941 Urge incontinence: Secondary | ICD-10-CM | POA: Diagnosis not present

## 2018-05-03 DIAGNOSIS — N138 Other obstructive and reflux uropathy: Secondary | ICD-10-CM | POA: Diagnosis not present

## 2018-05-03 DIAGNOSIS — N401 Enlarged prostate with lower urinary tract symptoms: Secondary | ICD-10-CM | POA: Diagnosis not present

## 2018-05-03 MED ORDER — FINASTERIDE 5 MG PO TABS: 5.0000 mg | ORAL_TABLET | Freq: Every day | ORAL | 3 refills | Status: DC

## 2018-05-03 MED ORDER — TAMSULOSIN HCL 0.4 MG PO CAPS: 0.4000 mg | ORAL_CAPSULE | Freq: Every day | ORAL | 3 refills | Status: DC

## 2018-05-22 ENCOUNTER — Telehealth: Payer: Self-pay | Admitting: Urology

## 2018-05-22 ENCOUNTER — Other Ambulatory Visit: Payer: Self-pay | Admitting: Urology

## 2018-05-22 MED ORDER — FESOTERODINE FUMARATE ER 4 MG PO TB24: 4.0000 mg | ORAL_TABLET | Freq: Every day | ORAL | 3 refills | Status: DC

## 2018-05-22 NOTE — Telephone Encounter (Signed)
Done

## 2018-05-22 NOTE — Progress Notes (Signed)
Toviaz 4 mg daily script sent to pharmacy.

## 2018-05-22 NOTE — Telephone Encounter (Signed)
Patient is asking for a script for Toviaz 4 mg he said it helped him. Can we call it into the [facility] on [address].   Sharyn Lull

## 2018-06-15 DIAGNOSIS — D2271 Melanocytic nevi of right lower limb, including hip: Secondary | ICD-10-CM | POA: Diagnosis not present

## 2018-06-15 DIAGNOSIS — L570 Actinic keratosis: Secondary | ICD-10-CM | POA: Diagnosis not present

## 2018-06-15 DIAGNOSIS — D2261 Melanocytic nevi of right upper limb, including shoulder: Secondary | ICD-10-CM | POA: Diagnosis not present

## 2018-06-15 DIAGNOSIS — D225 Melanocytic nevi of trunk: Secondary | ICD-10-CM | POA: Diagnosis not present

## 2018-06-15 DIAGNOSIS — D2262 Melanocytic nevi of left upper limb, including shoulder: Secondary | ICD-10-CM | POA: Diagnosis not present

## 2018-06-15 DIAGNOSIS — X32XXXA Exposure to sunlight, initial encounter: Secondary | ICD-10-CM | POA: Diagnosis not present

## 2018-06-15 DIAGNOSIS — D2272 Melanocytic nevi of left lower limb, including hip: Secondary | ICD-10-CM | POA: Diagnosis not present

## 2018-06-20 ENCOUNTER — Other Ambulatory Visit: Payer: Self-pay | Admitting: Internal Medicine

## 2018-06-21 DIAGNOSIS — H6122 Impacted cerumen, left ear: Secondary | ICD-10-CM | POA: Diagnosis not present

## 2018-06-21 DIAGNOSIS — H903 Sensorineural hearing loss, bilateral: Secondary | ICD-10-CM | POA: Diagnosis not present

## 2018-08-22 ENCOUNTER — Other Ambulatory Visit: Payer: Self-pay | Admitting: Internal Medicine

## 2018-08-22 DIAGNOSIS — E785 Hyperlipidemia, unspecified: Secondary | ICD-10-CM

## 2018-08-28 NOTE — Telephone Encounter (Signed)
I refilled his simvastatin x 1, but he needs fasting labs and physical scheduled.  Please notify pt and schedule.

## 2018-08-28 NOTE — Telephone Encounter (Signed)
Pt last seen in July 2018. No future appt scheduled. Okay to refill?

## 2018-08-31 ENCOUNTER — Ambulatory Visit (INDEPENDENT_AMBULATORY_CARE_PROVIDER_SITE_OTHER): Payer: PPO

## 2018-08-31 VITALS — BP 162/80 | HR 66 | Temp 98.3°F | Resp 16 | Ht 73.0 in | Wt 234.8 lb

## 2018-08-31 DIAGNOSIS — Z23 Encounter for immunization: Secondary | ICD-10-CM

## 2018-08-31 DIAGNOSIS — Z0000 Encounter for general adult medical examination without abnormal findings: Secondary | ICD-10-CM | POA: Diagnosis not present

## 2018-08-31 NOTE — Progress Notes (Signed)
Subjective:   FARRIS GEIMAN is a 82 y.o. male who presents for Medicare Annual/Subsequent preventive examination.  Review of Systems:  No ROS.  Medicare Wellness Visit. Additional risk factors are reflected in the social history.  Cardiac Risk Factors include: male gender;advanced age (>37men, >17 women);hypertension     Objective:    Vitals: BP (!) 162/80 (BP Location: Left Arm, Patient Position: Sitting, Cuff Size: Normal)   Pulse 66   Temp 98.3 F (36.8 C) (Oral)   Resp 16   Ht 6\' 1"  (1.854 m)   Wt 234 lb 12.8 oz (106.5 kg)   SpO2 95%   BMI 30.98 kg/m   Body mass index is 30.98 kg/m.  Advanced Directives 08/31/2018 08/30/2017 08/30/2016 08/28/2015  Does Patient Have a Medical Advance Directive? Yes Yes Yes Yes  Type of Advance Directive Living will;Healthcare Power of [place];Living will [place];Living will [place];Living will  Does patient want to make changes to medical advance directive? No - Patient declined No - Patient declined - No - Patient declined  Copy of [place] in Chart? Yes Yes Yes No - copy requested    Tobacco Social History   Tobacco Use  Smoking Status Never Smoker  Smokeless Tobacco Never Used     Counseling given: Not Answered   Clinical Intake:  Pre-visit preparation completed: Yes  Pain : No/denies pain     Nutritional Status: BMI > 30  Obese Diabetes: No  How often do you need to have someone help you when you read instructions, pamphlets, or other written materials from your doctor or pharmacy?: 1 - Never  Interpreter Needed?: No     Past Medical History:  Diagnosis Date  . Arthritis   . BPH with obstruction/lower urinary tract symptoms   . GERD (gastroesophageal reflux disease)   . Hyperlipidemia    taken off simvastatin last year  . Hypertension   . Obesity   . Pneumonia    hospitalized   . Urinary frequency   . Urine  incontinence    Past Surgical History:  Procedure Laterality Date  . COLONOSCOPY WITH PROPOFOL N/A 02/09/2016   Procedure: COLONOSCOPY WITH PROPOFOL;  Surgeon: Hulen Luster, MD;  Location: [facility] ENDOSCOPY;  Service: Gastroenterology;  Laterality: N/A;  . ESOPHAGOGASTRODUODENOSCOPY (EGD) WITH PROPOFOL N/A 02/09/2016   Procedure: ESOPHAGOGASTRODUODENOSCOPY (EGD) WITH PROPOFOL;  Surgeon: Hulen Luster, MD;  Location: [facility] ENDOSCOPY;  Service: Gastroenterology;  Laterality: N/A;  . HERNIA REPAIR     1990's - 3 surgeries   Family History  Problem Relation Age of Onset  . Arthritis Mother   . Heart disease Mother        CHF  . Cancer Father        Prostate  . Dementia Father   . Alzheimer's disease Father   . Hypotension Sister   . Kidney cancer Neg Hx   . Bladder Cancer Neg Hx    Social History   Socioeconomic History  . Marital status: Married    Spouse name: Not on file  . Number of children: Not on file  . Years of education: Not on file  . Highest education level: Not on file  Occupational History  . Not on file  Social Needs  . Financial resource strain: Not hard at all  . Food insecurity:    Worry: Never true    Inability: Never true  . Transportation needs:    Medical:  No    Non-medical: No  Tobacco Use  . Smoking status: Never Smoker  . Smokeless tobacco: Never Used  Substance and Sexual Activity  . Alcohol use: Yes    Alcohol/week: 0.0 standard drinks    Comment: OCC glass of wine  . Drug use: No  . Sexual activity: Yes  Lifestyle  . Physical activity:    Days per week: 5 days    Minutes per session: 30 min  . Stress: Not at all  Relationships  . Social connections:    Talks on phone: Not on file    Gets together: Not on file    Attends religious service: Not on file    Active member of club or organization: Not on file    Attends meetings of clubs or organizations: Not on file    Relationship status: Not on file  Other Topics Concern  . Not on file  Social  History Narrative   Lives in [place].      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 amLODipine (NORVASC) 2.5 MG tablet TAKE 1 TABLET BY MOUTH ONCE DAILY  . aspirin EC 81 MG tablet Take by mouth.  . Cholecalciferol (VITAMIN D-3) 1000 units CAPS Take 1 capsule by mouth.  . Coenzyme Q10 (COQ10 PO) Take 1 capsule by mouth daily.  . fesoterodine (TOVIAZ) 4 MG TB24 tablet Take 1 tablet (4 mg total) by mouth daily.  . finasteride (PROSCAR) 5 MG tablet Take 1 tablet (5 mg total) by mouth daily.  . Multiple Vitamin (MULTI-VITAMINS) TABS Take by mouth.  . Omega-3 Fatty Acids (FISH OIL PO) Take by mouth.  Mar[place] Kitchen omeprazole (PRILOSEC) 20 MG capsule Take 1 capsule (20 mg total) by mouth daily.  . simvastatin (ZOCOR) 20 MG tablet TAKE 1 TABLET BY MOUTH AT BEDTIME NEEDS  APPOINTMENT  FOR  ADDITIONAL  OR  90  DAY  REFILLS  . tamsulosin (FLOMAX) 0.4 MG CAPS capsule Take 1 capsule (0.4 mg total) by mouth daily.  . [DISCONTINUED] Cyanocobalamin (B-12 PO) Take by mouth.   No facility-administered encounter medications on file as of 08/31/2018.     Activities of Daily Living In your present state of health, do you have any difficulty performing the following activities: 08/31/2018  Hearing? Y  Comment Hearing Aids  Vision? N  Difficulty concentrating or making decisions? N  Walking or climbing stairs? N  Dressing or bathing? N  Doing errands, shopping? N  Preparing Food and eating ? N  Using the Toilet? N  In the past six months, have you accidently leaked urine? Y  Comment Followed by Urology   Do you have problems with loss of bowel control? N  Managing your Medications? N  Managing your Finances? N  Housekeeping or managing your Housekeeping? N  Some recent data might be hidden    Patient  Care Team: [name], MD as PCP - General (Internal Medicine)   Assessment:   This is a routine wellness examination for Terence.  The goal of the wellness visit is to assist the patient how to close the gaps in care and create a preventative care plan for the patient.   The roster of all  physicians providing medical care to patient is listed in the Snapshot section of the chart.  Taking calcium VIT D as appropriate/Osteoporosis risk reviewed.    Safety issues reviewed; Smoke and carbon monoxide detectors in the home. No firearms in the home. Wears seatbelts when driving or riding with others. No violence in the home.  They do not have excessive sun exposure.  Discussed the need for sun protection: hats, long sleeves and the use of sunscreen if there is significant sun exposure.  Patient is alert, normal appearance, oriented to person/place/and time. Correctly identified the president of the [place] and recalls of 2/3 words.Performs simple calculations and can read correct time from watch face. Displays appropriate judgement.  No new identified risk were noted.  No failures at ADL's or IADL's.    BMI- discussed the importance of a healthy diet, water intake and the benefits of aerobic exercise. Educational material provided.   24 hour diet recall: Regular diet.  Low carb diet encouraged.  Educational material provided.   Dental- UTD. Upper dentures.   Eye- Visual acuity not assessed per patient preference since they have regular follow up with the ophthalmologist.  Wears corrective lenses.  Sleep patterns- Sleeps 8 hours at night.  Wakes feeling rested. No longer B12.oral.   High dose influenza vaccine administered R deltoid administered, tolerated well. No grimacing or verbal complaint during or after given. Educational material provided.  Annual scheduled with pcp. Due for labs.  Not fasting today.   Health maintenance gaps- closed.  Patient Concerns: None at this time.  Follow up with PCP as needed.  Exercise Activities and Dietary recommendations Current Exercise Habits: Home exercise routine, Type of exercise: walking;calisthenics, Time (Minutes): 30, Frequency (Times/Week): 5, Weekly Exercise (Minutes/Week): 150, Intensity: Moderate  Goals    . Low Carb Diet       Fall Risk Fall Risk  08/31/2018 08/30/2017 10/06/2016 08/30/2016 08/28/2015  Falls in the past year? No No No No No   Depression Screen PHQ 2/9 Scores 08/31/2018 08/30/2017 07/19/2017 10/06/2016  PHQ - 2 Score 0 0 0 0  PHQ- 9 Score - 0 0 -    Cognitive Function MMSE - Mini Mental State Exam 08/30/2016 08/28/2015  Orientation to time 5 5  Orientation to Place 5 5  Registration 3 3  Attention/ Calculation 5 5  Recall 3 3  Language- name 2 objects 2 2  Language- repeat 1 1  Language- follow 3 step command 3 3  Language- read & follow direction 1 1  Write a sentence 1 1  Copy design 1 1  Total score 30 30     6CIT Screen 08/31/2018 08/30/2017  What Year? 0 points 0 points  What month? 0 points 0 points  What time? 0 points 0 points  Count back from 20 0 points 0 points  Months in reverse 2 points 0 points  Repeat phrase 0 points 0 points  Total Score 2 0    Immunization History  Administered Date(s) Administered  . Influenza, High Dose Seasonal PF 08/28/2015, 08/30/2016, 08/30/2017, 08/31/2018  . Influenza-Unspecified 09/21/2014  . Pneumococcal Conjugate-13 10/22/2014  . Pneumococcal-Unspecified 10/23/2011  . Tdap 08/28/2015   Screening Tests Health Maintenance  Topic Date Due  . TETANUS/TDAP  08/27/2025  . INFLUENZA VACCINE  Completed  . PNA vac Low Risk Adult  Completed       Plan:   End of life planning; Advance aging; Advanced directives discussed. Copy of current HCPOA/Living Will on file.  I have personally reviewed and noted the following in the patient's chart:   . Medical and social history . Use of alcohol, tobacco or illicit drugs  . Current medications  and supplements . Functional ability and status . Nutritional status . Physical activity . Advanced directives . List of other physicians . Hospitalizations, surgeries, and ER visits in previous 12 months . Vitals . Screenings to include cognitive, depression, and falls . Referrals and appointments  In addition, I have reviewed and discussed with patient certain preventive protocols, quality metrics, and best practice recommendations. A written personalized care plan for preventive services as well as general preventive health recommendations were provided to patient.     Varney Biles, LPN  01/10/5833   Reviewed above information.  Agree with assessment and plan.    Dr Nicki Reaper

## 2018-08-31 NOTE — Patient Instructions (Addendum)
  Brian Pearson , Thank you for taking time to come for your Medicare Wellness Visit. I appreciate your ongoing commitment to your health goals. Please review the following plan we discussed and let me know if I can assist you in the future.   Keep all routine maintenance appointments.   These are the goals we discussed: Goals    . Low Carb Diet       This is a list of the screening recommended for you and due dates:  Health Maintenance  Topic Date Due  . Tetanus Vaccine  08/27/2025  . Flu Shot  Completed  . Pneumonia vaccines  Completed

## 2018-10-04 ENCOUNTER — Ambulatory Visit (INDEPENDENT_AMBULATORY_CARE_PROVIDER_SITE_OTHER): Payer: PPO | Admitting: Internal Medicine

## 2018-10-04 ENCOUNTER — Encounter: Payer: Self-pay | Admitting: Internal Medicine

## 2018-10-04 VITALS — BP 136/70 | HR 69 | Temp 97.7°F | Resp 18 | Wt 232.8 lb

## 2018-10-04 DIAGNOSIS — I10 Essential (primary) hypertension: Secondary | ICD-10-CM

## 2018-10-04 DIAGNOSIS — E785 Hyperlipidemia, unspecified: Secondary | ICD-10-CM | POA: Diagnosis not present

## 2018-10-04 DIAGNOSIS — N400 Enlarged prostate without lower urinary tract symptoms: Secondary | ICD-10-CM

## 2018-10-04 LAB — CBC WITH DIFFERENTIAL/PLATELET
Basophils Absolute: 0 10*3/uL (ref 0.0–0.1)
Basophils Relative: 0.6 % (ref 0.0–3.0)
Eosinophils Absolute: 0.2 10*3/uL (ref 0.0–0.7)
Eosinophils Relative: 3.2 % (ref 0.0–5.0)
HCT: 43.4 % (ref 39.0–52.0)
Hemoglobin: 14.5 g/dL (ref 13.0–17.0)
Lymphocytes Relative: 29.4 % (ref 12.0–46.0)
Lymphs Abs: 1.6 10*3/uL (ref 0.7–4.0)
MCHC: 33.5 g/dL (ref 30.0–36.0)
MCV: 93.5 fl (ref 78.0–100.0)
Monocytes Absolute: 0.6 10*3/uL (ref 0.1–1.0)
Monocytes Relative: 10.4 % (ref 3.0–12.0)
Neutro Abs: 3.1 10*3/uL (ref 1.4–7.7)
Neutrophils Relative %: 56.4 % (ref 43.0–77.0)
Platelets: 199 10*3/uL (ref 150.0–400.0)
RBC: 4.64 Mil/uL (ref 4.22–5.81)
RDW: 13.3 % (ref 11.5–15.5)
WBC: 5.6 10*3/uL (ref 4.0–10.5)

## 2018-10-04 LAB — TSH: TSH: 2.89 u[IU]/mL (ref 0.35–4.50)

## 2018-10-04 LAB — BASIC METABOLIC PANEL
BUN: 15 mg/dL (ref 6–23)
CO2: 26 mEq/L (ref 19–32)
Calcium: 9.3 mg/dL (ref 8.4–10.5)
Chloride: 106 mEq/L (ref 96–112)
Creatinine, Ser: 0.93 mg/dL (ref 0.40–1.50)
GFR: 82.98 mL/min (ref >60.00)
Glucose, Bld: 104 mg/dL — ABNORMAL HIGH (ref 70–99)
Potassium: 4 mEq/L (ref 3.5–5.1)
Sodium: 140 mEq/L (ref 135–145)

## 2018-10-04 LAB — LIPID PANEL
Cholesterol: 150 mg/dL (ref 0–200)
HDL: 52.3 mg/dL (ref >39.00)
LDL Cholesterol: 72 mg/dL (ref 0–99)
NonHDL: 97.89
Total CHOL/HDL Ratio: 3
Triglycerides: 128 mg/dL (ref 0.0–149.0)
VLDL: 25.6 mg/dL (ref 0.0–40.0)

## 2018-10-04 LAB — HEPATIC FUNCTION PANEL
ALT: 21 U/L (ref 0–53)
AST: 23 U/L (ref 0–37)
Albumin: 4.3 g/dL (ref 3.5–5.2)
Alkaline Phosphatase: 73 U/L (ref 39–117)
Bilirubin, Direct: 0.1 mg/dL (ref 0.0–0.3)
Total Bilirubin: 0.8 mg/dL (ref 0.2–1.2)
Total Protein: 6.9 g/dL (ref 6.0–8.3)

## 2018-10-04 NOTE — Progress Notes (Signed)
Patient ID: Brian Pearson, male   DOB: Aug 06, 1936, 82 y.o.   MRN: [id number]   Subjective:    Patient ID: Brian Pearson, male    DOB: [birth year], 82 y.o.   MRN: [id number]  HPI  Patient here for a scheduled follow up.  He reports he is doing well. Feels good.  Trying to stay active.  No chest pain.  No sob.  No acid reflux.  No abdominal pain.  Bowels moving.  Sees urology.  On flomax.  Was recently started on toviaz.  Given samples.  Helped.  Too expensive.  Plans to discuss with urology.     Past Medical History:  Diagnosis Date  . Arthritis   . BPH with obstruction/lower urinary tract symptoms   . GERD (gastroesophageal reflux disease)   . Hyperlipidemia    taken off simvastatin last year  . Hypertension   . Obesity   . Pneumonia    hospitalized   . Urinary frequency   . Urine incontinence    Past Surgical History:  Procedure Laterality Date  . COLONOSCOPY WITH PROPOFOL N/A 02/09/2016   Procedure: COLONOSCOPY WITH PROPOFOL;  Surgeon: Hulen Luster, MD;  Location: [facility] ENDOSCOPY;  Service: Gastroenterology;  Laterality: N/A;  . ESOPHAGOGASTRODUODENOSCOPY (EGD) WITH PROPOFOL N/A 02/09/2016   Procedure: ESOPHAGOGASTRODUODENOSCOPY (EGD) WITH PROPOFOL;  Surgeon: Hulen Luster, MD;  Location: [facility] ENDOSCOPY;  Service: Gastroenterology;  Laterality: N/A;  . HERNIA REPAIR     1990's - 3 surgeries   Family History  Problem Relation Age of Onset  . Arthritis Mother   . Heart disease Mother        CHF  . Cancer Father        Prostate  . Dementia Father   . Alzheimer's disease Father   . Hypotension Sister   . Kidney cancer Neg Hx   . Bladder Cancer Neg Hx    Social History   Socioeconomic History  . Marital status: Married    Spouse name: Not on file  . Number of children: Not on file  . Years of education: Not on file  . Highest education level: Not on file  Occupational History  . Not on file  Social Needs  . Financial resource strain: Not hard at all  . Food insecurity:     Worry: Never true    Inability: Never true  . Transportation needs:    Medical: No    Non-medical: No  Tobacco Use  . Smoking status: Never Smoker  . Smokeless tobacco: Never Used  Substance and Sexual Activity  . Alcohol use: Yes    Alcohol/week: 0.0 standard drinks    Comment: OCC glass of wine  . Drug use: No  . Sexual activity: Yes  Lifestyle  . Physical activity:    Days per week: 5 days    Minutes per session: 30 min  . Stress: Not at all  Relationships  . Social connections:    Talks on phone: Not on file    Gets together: Not on file    Attends religious service: Not on file    Active member of club or organization: Not on file    Attends meetings of clubs or organizations: Not on file    Relationship status: Not on file  Other Topics Concern  . Not on file  Social History Narrative   Lives in [place].      Work - retired, works part time driving for [employer]  Hobbies - golf in past      Exercise - stationary bike      Diet - regular      Served in [place] [employer] [facility]    Outpatient Encounter Medications as of 10/04/2018  Medication Sig  . amLODipine (NORVASC) 2.5 MG tablet TAKE 1 TABLET BY MOUTH ONCE DAILY  . aspirin EC 81 MG tablet Take by mouth.  . Cholecalciferol (VITAMIN D-3) 1000 units CAPS Take 1 capsule by mouth.  . Coenzyme Q10 (COQ10 PO) Take 1 capsule by mouth daily.  . fesoterodine (TOVIAZ) 4 MG TB24 tablet Take 1 tablet (4 mg total) by mouth daily.  . finasteride (PROSCAR) 5 MG tablet Take 1 tablet (5 mg total) by mouth daily.  . Multiple Vitamin (MULTI-VITAMINS) TABS Take by mouth.  . Omega-3 Fatty Acids (FISH OIL PO) Take by mouth.  Marland Kitchen omeprazole (PRILOSEC) 20 MG capsule Take 1 capsule (20 mg total) by mouth daily.  . simvastatin (ZOCOR) 20 MG tablet TAKE 1 TABLET BY MOUTH AT BEDTIME NEEDS  APPOINTMENT  FOR  ADDITIONAL  OR  90  DAY  REFILLS  . tamsulosin (FLOMAX) 0.4 MG CAPS capsule Take 1 capsule (0.4 mg total) by mouth  daily.   No facility-administered encounter medications on file as of 10/04/2018.     Review of Systems  Constitutional: Negative for appetite change and unexpected weight change.  HENT: Negative for congestion and sinus pressure.   Respiratory: Negative for cough, chest tightness and shortness of breath.   Cardiovascular: Negative for chest pain, palpitations and leg swelling.  Gastrointestinal: Negative for abdominal pain, diarrhea, nausea and vomiting.  Genitourinary: Negative for dysuria and flank pain.  Musculoskeletal: Negative for joint swelling and myalgias.  Skin: Negative for color change and rash.  Neurological: Negative for dizziness, light-headedness and headaches.  Psychiatric/Behavioral: Negative for agitation and dysphoric mood.       Objective:    Physical Exam  Constitutional: He appears well-developed and well-nourished. No distress.  HENT:  Nose: Nose normal.  Mouth/Throat: Oropharynx is clear and moist.  Neck: Neck supple.  Cardiovascular: Normal rate and regular rhythm.  Pulmonary/Chest: Effort normal and breath sounds normal. No respiratory distress.  Abdominal: Soft. Bowel sounds are normal. There is no tenderness.  Musculoskeletal: He exhibits no edema or tenderness.  Lymphadenopathy:    He has no cervical adenopathy.  Skin: No rash noted. No erythema.  Psychiatric: He has a normal mood and affect. His behavior is normal.    BP 136/70 (BP Location: Left Arm, Patient Position: Sitting, Cuff Size: Normal)   Pulse 69   Temp 97.7 F (36.5 C) (Oral)   Resp 18   Wt 232 lb 12.8 oz (105.6 kg)   SpO2 96%   BMI 30.71 kg/m  Wt Readings from Last 3 Encounters:  10/04/18 232 lb 12.8 oz (105.6 kg)  08/31/18 234 lb 12.8 oz (106.5 kg)  05/03/18 232 lb 9.6 oz (105.5 kg)     Lab Results  Component Value Date   WBC 5.6 10/04/2018   HGB 14.5 10/04/2018   HCT 43.4 10/04/2018   PLT 199.0 10/04/2018   GLUCOSE 104 (H) 10/04/2018   CHOL 150 10/04/2018    TRIG 128.0 10/04/2018   HDL 52.30 10/04/2018   LDLCALC 72 10/04/2018   ALT 21 10/04/2018   AST 23 10/04/2018   NA 140 10/04/2018   K 4.0 10/04/2018   CL 106 10/04/2018   CREATININE 0.93 10/04/2018   BUN 15 10/04/2018   CO2 26 10/04/2018  TSH 2.89 10/04/2018   PSA 0.92 07/19/2017   MICROALBUR 0.7 10/22/2014       Assessment & Plan:   Problem List Items Addressed This Visit    Benign prostatic hyperplasia without urinary obstruction    Followed by urology.       Essential hypertension    Blood pressure under good control.  Continue same medication regimen.  Follow pressures.  Follow metabolic panel.        Relevant Orders   CBC with Differential/Platelet (Completed)   TSH (Completed)   Basic metabolic panel (Completed)   Hyperlipidemia - Primary    On simvastatin.  Low cholesterol diet and exercise.  Follow lipid panel and liver function tests.        Relevant Orders   Hepatic function panel (Completed)   Lipid panel (Completed)       [name], MD

## 2018-10-07 ENCOUNTER — Encounter: Payer: Self-pay | Admitting: Internal Medicine

## 2018-10-07 NOTE — Assessment & Plan Note (Signed)
Blood pressure under good control.  Continue same medication regimen.  Follow pressures.  Follow metabolic panel.   

## 2018-10-07 NOTE — Assessment & Plan Note (Signed)
Followed by urology.   

## 2018-10-07 NOTE — Assessment & Plan Note (Signed)
On simvastatin.  Low cholesterol diet and exercise.  Follow lipid panel and liver function tests.   

## 2018-11-06 ENCOUNTER — Other Ambulatory Visit: Payer: Self-pay | Admitting: Pharmacist

## 2018-11-06 NOTE — Patient Outreach (Signed)
[facility]  11/06/2018  KOBEY SIDES 06-26-1936 [id number]   Outreach call to [name] regarding his request for follow up from the [facility] Medication Adherence Campaign. Patient contacted regarding adherence to simvastatin. Left a HIPAA compliant message on the patient's voicemail.  Harlow Asa, PharmD, [facility] [phone]

## 2018-11-13 ENCOUNTER — Other Ambulatory Visit: Payer: Self-pay | Admitting: Internal Medicine

## 2018-11-13 DIAGNOSIS — R1310 Dysphagia, unspecified: Secondary | ICD-10-CM

## 2018-11-23 DIAGNOSIS — M25562 Pain in left knee: Secondary | ICD-10-CM | POA: Diagnosis not present

## 2018-11-23 DIAGNOSIS — M170 Bilateral primary osteoarthritis of knee: Secondary | ICD-10-CM | POA: Diagnosis not present

## 2018-11-23 DIAGNOSIS — M25561 Pain in right knee: Secondary | ICD-10-CM | POA: Diagnosis not present

## 2018-12-01 ENCOUNTER — Other Ambulatory Visit: Payer: Self-pay | Admitting: Internal Medicine

## 2018-12-01 DIAGNOSIS — E785 Hyperlipidemia, unspecified: Secondary | ICD-10-CM

## 2018-12-18 ENCOUNTER — Other Ambulatory Visit: Payer: Self-pay | Admitting: Internal Medicine

## 2019-02-15 DIAGNOSIS — D2272 Melanocytic nevi of left lower limb, including hip: Secondary | ICD-10-CM | POA: Diagnosis not present

## 2019-02-15 DIAGNOSIS — X32XXXA Exposure to sunlight, initial encounter: Secondary | ICD-10-CM | POA: Diagnosis not present

## 2019-02-15 DIAGNOSIS — D2262 Melanocytic nevi of left upper limb, including shoulder: Secondary | ICD-10-CM | POA: Diagnosis not present

## 2019-02-15 DIAGNOSIS — L570 Actinic keratosis: Secondary | ICD-10-CM | POA: Diagnosis not present

## 2019-02-15 DIAGNOSIS — D2261 Melanocytic nevi of right upper limb, including shoulder: Secondary | ICD-10-CM | POA: Diagnosis not present

## 2019-02-15 DIAGNOSIS — D225 Melanocytic nevi of trunk: Secondary | ICD-10-CM | POA: Diagnosis not present

## 2019-02-15 DIAGNOSIS — D2271 Melanocytic nevi of right lower limb, including hip: Secondary | ICD-10-CM | POA: Diagnosis not present

## 2019-04-04 ENCOUNTER — Other Ambulatory Visit: Payer: Self-pay | Admitting: Internal Medicine

## 2019-04-04 DIAGNOSIS — E785 Hyperlipidemia, unspecified: Secondary | ICD-10-CM

## 2019-04-05 ENCOUNTER — Ambulatory Visit: Payer: PPO | Admitting: Internal Medicine

## 2019-04-05 ENCOUNTER — Encounter: Payer: Self-pay | Admitting: Internal Medicine

## 2019-04-05 ENCOUNTER — Other Ambulatory Visit: Payer: Self-pay

## 2019-04-05 ENCOUNTER — Ambulatory Visit (INDEPENDENT_AMBULATORY_CARE_PROVIDER_SITE_OTHER): Payer: PPO | Admitting: Internal Medicine

## 2019-04-05 DIAGNOSIS — R0681 Apnea, not elsewhere classified: Secondary | ICD-10-CM | POA: Diagnosis not present

## 2019-04-05 DIAGNOSIS — N400 Enlarged prostate without lower urinary tract symptoms: Secondary | ICD-10-CM | POA: Diagnosis not present

## 2019-04-05 DIAGNOSIS — E785 Hyperlipidemia, unspecified: Secondary | ICD-10-CM | POA: Diagnosis not present

## 2019-04-05 DIAGNOSIS — I10 Essential (primary) hypertension: Secondary | ICD-10-CM

## 2019-04-05 NOTE — Progress Notes (Addendum)
Patient ID: Brian Pearson, male   DOB: [birth year], 83 y.o.   MRN: [id number] Virtual Visit via Video: Note  This visit type was conducted due to national recommendations for restrictions regarding the COVID-19 pandemic (e.g. social distancing).  This format is felt to be most appropriate for this patient at this time.  All issues noted in this document were discussed and addressed.  No physical exam was performed (except for noted visual exam findings with Video Visits).   I connected with Laurina Bustle on 04/05/19 at 10:00 AM EDT by a video enabled telemedicine application.  Verified that I am speaking with the correct person using two identifiers. Location patient: home Location provider: work Persons participating in the virtual visit: patient, provider  I discussed the limitations, risks, security and privacy concerns of performing an evaluation and management service by video and the availability of in person appointments. The patient expressed understanding and agreed to proceed.   Reason for visit: scheduled follow up.   HPI: He reports he is doing relatively well.  Staying in.  No known COVID exposure.  No fever.  No chest congestion.  Trying to stay active.  Riding his stationary bike.  No chest pain.  No sob.  No acid reflux.  No abdominal pain.  Bowels moving.  No urine change.  Takes allegra for allergies - controls.  States blood pressures averaging 125-130/60s.  Taking flomax.  No urinary issues.  Wife present and mentioned concern over possible sleep apnea.  Has witnessed apneic episodes.  Snores  Easy to fall asleep through the day.  Request referral for evaluation for sleep apnea.     ROS: See pertinent positives and negatives per HPI.  Past Medical History:  Diagnosis Date  . Arthritis   . BPH with obstruction/lower urinary tract symptoms   . GERD (gastroesophageal reflux disease)   . Hyperlipidemia    taken off simvastatin last year  . Hypertension   . Obesity   .  Pneumonia    hospitalized   . Urinary frequency   . Urine incontinence     Past Surgical History:  Procedure Laterality Date  . COLONOSCOPY WITH PROPOFOL N/A 02/09/2016   Procedure: COLONOSCOPY WITH PROPOFOL;  Surgeon: Hulen Luster, MD;  Location: [facility] ENDOSCOPY;  Service: Gastroenterology;  Laterality: N/A;  . ESOPHAGOGASTRODUODENOSCOPY (EGD) WITH PROPOFOL N/A 02/09/2016   Procedure: ESOPHAGOGASTRODUODENOSCOPY (EGD) WITH PROPOFOL;  Surgeon: Hulen Luster, MD;  Location: [facility] ENDOSCOPY;  Service: Gastroenterology;  Laterality: N/A;  . HERNIA REPAIR     1990's - 3 surgeries    Family History  Problem Relation Age of Onset  . Arthritis Mother   . Heart disease Mother        CHF  . Cancer Father        Prostate  . Dementia Father   . Alzheimer's disease Father   . Hypotension Sister   . Kidney cancer Neg Hx   . Bladder Cancer Neg Hx     SOCIAL HX: reviewed.    Current Outpatient Medications:  .  amLODipine (NORVASC) 2.5 MG tablet, TAKE 1 TABLET BY MOUTH ONCE DAILY, Disp: 90 tablet, Rfl: 1 .  aspirin EC 81 MG tablet, Take by mouth., Disp: , Rfl:  .  Cholecalciferol (VITAMIN D-3) 1000 units CAPS, Take 1 capsule by mouth., Disp: , Rfl:  .  Coenzyme Q10 (COQ10 PO), Take 1 capsule by mouth daily., Disp: , Rfl:  .  fesoterodine (TOVIAZ) 4 MG TB24 tablet, Take 1 tablet (4  mg total) by mouth daily., Disp: 90 tablet, Rfl: 3 .  finasteride (PROSCAR) 5 MG tablet, Take 1 tablet (5 mg total) by mouth daily., Disp: 90 tablet, Rfl: 3 .  Multiple Vitamin (MULTI-VITAMINS) TABS, Take by mouth., Disp: , Rfl:  .  Omega-3 Fatty Acids (FISH OIL PO), Take by mouth., Disp: , Rfl:  .  omeprazole (PRILOSEC) 20 MG capsule, TAKE 1 CAPSULE BY MOUTH ONCE DAILY, Disp: 90 capsule, Rfl: 1 .  simvastatin (ZOCOR) 20 MG tablet, TAKE 1 TABLET BY MOUTH AT BEDTIME NEEDS  APPOINTMENT  FOR  ADDITIONAL  OR  90  DAY  REFILLS, Disp: 90 tablet, Rfl: 0 .  tamsulosin (FLOMAX) 0.4 MG CAPS capsule, Take 1 capsule (0.4 mg total) by  mouth daily., Disp: 90 capsule, Rfl: 3  EXAM:  GENERAL: alert, oriented, appears well and in no acute distress  HEENT: atraumatic, conjunttiva clear, no obvious abnormalities on inspection of external nose and ears  NECK: normal movements of the head and neck  LUNGS: on inspection no signs of respiratory distress, breathing rate appears normal, no obvious gross SOB, gasping or wheezing  CV: no obvious cyanosis  PSYCH/NEURO: pleasant and cooperative, no obvious depression or anxiety, speech and thought processing grossly intact  ASSESSMENT AND PLAN:  Discussed the following assessment and plan:  Benign prostatic hyperplasia without urinary obstruction  Essential hypertension  Hyperlipidemia, unspecified hyperlipidemia type  Apneic episode - Plan: Ambulatory referral to Pulmonology  Benign prostatic hyperplasia without urinary obstruction Followed by urology.  Stable.  On flomax.    Essential hypertension Blood pressure as outlined.  Continue same medication regimen.  Follow pressures.  Follow metabolic panel.   Hyperlipidemia On simvastatin.  Low cholesterol diet and exercise.  Follow lipid panel and liver function tests.    Apneic episode With increased snoring and witnessed apneic episodes.  Also easy to fall asleep during the day.  Will refer to pulmonary for evaluation for possible sleep apnea.      I discussed the assessment and treatment plan with the patient. The patient was provided an opportunity to ask questions and all were answered. The patient agreed with the plan and demonstrated an understanding of the instructions.   The patient was advised to call back or seek an in-person evaluation if the symptoms worsen or if the condition fails to improve as anticipated.    [name], MD

## 2019-04-07 ENCOUNTER — Encounter: Payer: Self-pay | Admitting: Internal Medicine

## 2019-04-07 DIAGNOSIS — Z9989 Dependence on other enabling machines and devices: Secondary | ICD-10-CM | POA: Insufficient documentation

## 2019-04-07 DIAGNOSIS — R0681 Apnea, not elsewhere classified: Secondary | ICD-10-CM

## 2019-04-07 DIAGNOSIS — G4733 Obstructive sleep apnea (adult) (pediatric): Secondary | ICD-10-CM | POA: Insufficient documentation

## 2019-04-07 NOTE — Assessment & Plan Note (Signed)
Followed by urology.  Stable.  On flomax.

## 2019-04-07 NOTE — Addendum Note (Signed)
Addended by: Alisa Graff on: 04/07/2019 09:53 PM   Modules accepted: Orders

## 2019-04-07 NOTE — Assessment & Plan Note (Signed)
With increased snoring and witnessed apneic episodes.  Also easy to fall asleep during the day.  Will refer to pulmonary for evaluation for possible sleep apnea.

## 2019-04-07 NOTE — Assessment & Plan Note (Signed)
On simvastatin.  Low cholesterol diet and exercise.  Follow lipid panel and liver function tests.   

## 2019-04-07 NOTE — Assessment & Plan Note (Signed)
Blood pressure as outlined.  Continue same medication regimen.  Follow pressures.  Follow metabolic panel.  

## 2019-04-30 ENCOUNTER — Ambulatory Visit: Payer: PPO | Admitting: Urology

## 2019-05-01 ENCOUNTER — Other Ambulatory Visit: Payer: Self-pay

## 2019-05-01 ENCOUNTER — Telehealth: Payer: Self-pay | Admitting: Urology

## 2019-05-01 ENCOUNTER — Telehealth (INDEPENDENT_AMBULATORY_CARE_PROVIDER_SITE_OTHER): Payer: PPO | Admitting: Urology

## 2019-05-01 DIAGNOSIS — N3941 Urge incontinence: Secondary | ICD-10-CM

## 2019-05-01 DIAGNOSIS — N401 Enlarged prostate with lower urinary tract symptoms: Secondary | ICD-10-CM | POA: Diagnosis not present

## 2019-05-01 MED ORDER — TAMSULOSIN HCL 0.4 MG PO CAPS: 0.4000 mg | ORAL_CAPSULE | Freq: Every day | ORAL | 3 refills | Status: DC

## 2019-05-01 MED ORDER — FINASTERIDE 5 MG PO TABS: 5.0000 mg | ORAL_TABLET | Freq: Every day | ORAL | 3 refills | Status: DC

## 2019-05-01 NOTE — Progress Notes (Signed)
Virtual Visit via Telephone Note  I connected with Brian Pearson on 05/01/2019 at [place] by audio/visual and verified that I am speaking with the correct person using two identifiers.  They are located at home.  I am located at my home.    This visit type was conducted due to national recommendations for restrictions regarding the COVID-19 Pandemic (e.g. social distancing).  This format is felt to be most appropriate for this patient at this time.  All issues noted in this document were discussed and addressed.  No physical exam was performed.   I discussed the limitations, risks, security and privacy concerns of performing an evaluation and management service by telephone and the availability of in person appointments. I also discussed with the patient that there may be a patient responsible charge related to this service. The patient expressed understanding and agreed to proceed.   History of Present Illness: Brian Pearson is an 83 year old Caucasian male with BPH with LU TS, urge incontinence and ED who is contacted today for a one year follow up due to the COVID-19 pandemic.  He is currently managing his BPH with LU TS with tamsulosin 0.4 mg and finasteride 5 mg daily.  Patient denies any gross hematuria, dysuria or suprapubic/flank pain.  Patient denies any fevers, chills, nausea or vomiting.   He is as managing his urge incontinence with Toviaz 4 mg daily, but he found it cost prohibitive and did not continue the medication.  He does not feel the urge incontinence is bothersome to him at this time.    He is not interested in treatment for his ED at this time.     Observations/Objective: Brian Pearson is well groomed and does not appear to be in distress.  He answers questions appropriately.    Assessment and Plan:  1. BPH with LU TS - continue tamsulosin and finasteride - refills sent to pharmacy  2. Urge incontinence - not bothersome at this time   3. ED - does not desire treatment  at this time  Follow Up Instructions:  Brian Pearson will return in one year for I PSS, PVR and exam.   I discussed the assessment and treatment plan with the patient. The patient was provided an opportunity to ask questions and all were answered. The patient agreed with the plan and demonstrated an understanding of the instructions.   The patient was advised to call back or seek an in-person evaluation if the symptoms worsen or if the condition fails to improve as anticipated.  I provided 5 minutes of face-to-face time during this encounter.   Jaydin Boniface, PA-C

## 2019-05-01 NOTE — Telephone Encounter (Signed)
Would you call Brian Pearson and have him schedule a follow up appointment in one year for I PSS, PVR and exam?

## 2019-06-05 ENCOUNTER — Telehealth: Payer: Self-pay | Admitting: Internal Medicine

## 2019-06-05 NOTE — Telephone Encounter (Signed)
Called patient for COVID-19 pre-screening for in office visit. ° °Have you recently traveled any where out of the local area in the last 2 weeks? No °Have you been in close contact with a person diagnosed with COVID-19 within the last 2 weeks? No ° °Do you currently have any of the following symptoms? If so, when did they start? °Cough     Diarrhea  Joint Pain °Fever      Muscle Pain  Red eyes °Shortness of breath   Abdominal pain Vomiting °Loss of smell    Rash   Sore Throat °Headache    Weakness  Bruising or bleeding ° ° °Okay to proceed with visit 06/06/2019 °  ° ° °

## 2019-06-06 ENCOUNTER — Encounter: Payer: Self-pay | Admitting: Internal Medicine

## 2019-06-06 ENCOUNTER — Ambulatory Visit: Payer: PPO | Admitting: Internal Medicine

## 2019-06-06 ENCOUNTER — Other Ambulatory Visit: Payer: Self-pay

## 2019-06-06 VITALS — BP 138/62 | HR 75 | Temp 98.0°F | Ht 73.0 in | Wt 238.0 lb

## 2019-06-06 DIAGNOSIS — G4719 Other hypersomnia: Secondary | ICD-10-CM | POA: Diagnosis not present

## 2019-06-06 NOTE — Patient Instructions (Signed)
[place]

## 2019-06-06 NOTE — Progress Notes (Signed)
Name: JAYCOB MCCLENTON MRN: [id number] DOB: [birth year]     CONSULTATION DATE: 06/06/2019  REFERRING MD :[name]  CHIEF COMPLAINT:  HISTORY OF PRESENT ILLNESS: Patient is seen today for problems and issues with sleep related to excessive daytime sleepiness Patient  has been having sleep problems for many years Patient has been having excessive daytime sleepiness for a long time Patient has been having extreme fatigue and tiredness, lack of energy +  very Loud snoring every night + struggling breathe at night and gasps for air   Discussed sleep data and reviewed with patient.  Encouraged proper weight management.  Discussed driving precautions and its relationship with hypersomnolence.  Discussed operating dangerous equipment and its relationship with hypersomnolence.  Discussed sleep hygiene, and benefits of a fixed sleep waked time.  The importance of getting eight or more hours of sleep discussed with patient.  Discussed limiting the use of the computer and television before bedtime.  Decrease naps during the day, so night time sleep will become enhanced.  Limit caffeine, and sleep deprivation.  HTN, stroke, and heart failure are potential risk factors.    EPWORTH SLEEP SCORE 10    PAST MEDICAL HISTORY :   has a past medical history of Arthritis, BPH with obstruction/lower urinary tract symptoms, GERD (gastroesophageal reflux disease), Hyperlipidemia, Hypertension, Obesity, Pneumonia, Urinary frequency, and Urine incontinence.  has a past surgical history that includes Hernia repair; Colonoscopy with propofol (N/A, 02/09/2016); and Esophagogastroduodenoscopy (egd) with propofol (N/A, 02/09/2016). Prior to Admission medications   Medication Sig Start Date End Date Taking? Authorizing Provider  amLODipine (NORVASC) 2.5 MG tablet TAKE 1 TABLET BY MOUTH ONCE DAILY 12/18/18   [name], MD  aspirin EC 81 MG tablet Take by mouth.    [provider]   Cholecalciferol (VITAMIN D-3) 1000 units CAPS Take 1 capsule by mouth.    [provider]  Coenzyme Q10 (COQ10 PO) Take 1 capsule by mouth daily.    [provider]  fesoterodine (TOVIAZ) 4 MG TB24 tablet Take 1 tablet (4 mg total) by mouth daily. 05/22/18   [name], PA-C  finasteride (PROSCAR) 5 MG tablet Take 1 tablet (5 mg total) by mouth daily. 05/01/19   [name], PA-C  Multiple Vitamin (MULTI-VITAMINS) TABS Take by mouth.    [provider]  Omega-3 Fatty Acids (FISH OIL PO) Take by mouth.    [provider]  omeprazole (PRILOSEC) 20 MG capsule TAKE 1 CAPSULE BY MOUTH ONCE DAILY 11/14/18   [name], MD  simvastatin (ZOCOR) 20 MG tablet TAKE 1 TABLET BY MOUTH AT BEDTIME NEEDS  APPOINTMENT  FOR  ADDITIONAL  OR  90  DAY  REFILLS 04/04/19   [name], MD  tamsulosin (FLOMAX) 0.4 MG CAPS capsule Take 1 capsule (0.4 mg total) by mouth daily. 05/01/19   [name], PA-C   No Known Allergies  FAMILY HISTORY:  family history includes Alzheimer's disease in his father; Arthritis in his mother; Cancer in his father; Dementia in his father; Heart disease in his mother; Hypotension in his sister. SOCIAL HISTORY:  reports that he has never smoked. He has never used smokeless tobacco. He reports current alcohol use. He reports that he does not use drugs.  REVIEW OF SYSTEMS:   Constitutional: Negative for fever, chills, weight loss, malaise/fatigue and diaphoresis.  HENT: Negative for hearing loss, ear pain, nosebleeds, congestion, sore throat, neck pain, tinnitus and ear discharge.   Eyes: Negative for blurred vision, double  vision, photophobia, pain, discharge and redness.  Respiratory: Negative for cough, hemoptysis, sputum production, shortness of breath, wheezing and stridor.   Cardiovascular: Negative for chest pain, palpitations, orthopnea, claudication, leg swelling and PND.  Gastrointestinal: Negative for heartburn,  nausea, vomiting, abdominal pain, diarrhea, constipation, blood in stool and melena.  Genitourinary: Negative for dysuria, urgency, frequency, hematuria and flank pain.  Musculoskeletal: Negative for myalgias, back pain, joint pain and falls.  Skin: Negative for itching and rash.  Neurological: Negative for dizziness, tingling, tremors, sensory change, speech change, focal weakness, seizures, loss of consciousness, weakness and headaches.  Endo/Heme/Allergies: Negative for environmental allergies and polydipsia. Does not bruise/bleed easily.  ALL OTHER ROS ARE NEGATIVE   BP 138/62 (BP Location: Left Arm, Cuff Size: Normal)   Pulse 75   Temp 98 F (36.7 C) (Oral)   Ht 6\' 1"  (1.854 m)   Wt 238 lb (108 kg)   SpO2 95%   BMI 31.40 kg/m   Physical Examination:   GENERAL:NAD, no fevers, chills, no weakness no fatigue HEAD: Normocephalic, atraumatic.  EYES: Pupils equal, round, reactive to light. Extraocular muscles intact. No scleral icterus.  MOUTH: Moist mucosal membrane.   EAR, NOSE, THROAT: Clear without exudates. No external lesions.  NECK: Supple. No thyromegaly. No nodules. No JVD.  PULMONARY:CTA B/L no wheezes, no crackles, no rhonchi CARDIOVASCULAR: S1 and S2. Regular rate and rhythm. No murmurs, rubs, or gallops. No edema.  GASTROINTESTINAL: Soft, nontender, nondistended. No masses. Positive bowel sounds.  MUSCULOSKELETAL: No swelling, clubbing, or edema. Range of motion full in all extremities.  NEUROLOGIC: Cranial nerves II through XII are intact. No gross focal neurological deficits.  SKIN: No ulceration, lesions, rashes, or cyanosis. Skin warm and dry. Turgor intact.  PSYCHIATRIC: Mood, affect within normal limits. The patient is awake, alert and oriented x 3. Insight, judgment intact.        ASSESSMENT AND PLAN SYNOPSIS  83 yo pleasant white male seen today fro assessment for excessive dayteim sleepiness and snoring. Patient has a probability of having underlying  obstructive sleep apnea  Plan for Home Sleep Study for definitive diagnosis  COVID-19 EDUCATION: The signs and symptoms of COVID-19 were discussed with the patient and how to seek care for testing.  The importance of social distancing was discussed today. Hand Washing Techniques and avoid touching face was advised.  MEDICATION ADJUSTMENTS/LABS AND TESTS ORDERED: Sleep study ordered   CURRENT MEDICATIONS REVIEWED AT LENGTH WITH PATIENT TODAY   Patient satisfied with Plan of action and management. All questions answered Follow up in 3 months   Akshar Starnes Patricia Pesa, M.D.  [name]  Medical Director [place] Director [facility] Cardio-Pulmonary Department

## 2019-06-14 ENCOUNTER — Ambulatory Visit: Payer: PPO

## 2019-06-14 ENCOUNTER — Other Ambulatory Visit: Payer: Self-pay

## 2019-06-14 DIAGNOSIS — G4719 Other hypersomnia: Secondary | ICD-10-CM

## 2019-06-14 DIAGNOSIS — G4733 Obstructive sleep apnea (adult) (pediatric): Secondary | ICD-10-CM | POA: Diagnosis not present

## 2019-06-18 ENCOUNTER — Other Ambulatory Visit: Payer: Self-pay | Admitting: Internal Medicine

## 2019-06-20 ENCOUNTER — Telehealth: Payer: Self-pay | Admitting: Internal Medicine

## 2019-06-20 DIAGNOSIS — G4733 Obstructive sleep apnea (adult) (pediatric): Secondary | ICD-10-CM

## 2019-06-20 NOTE — Telephone Encounter (Signed)
HST preformed on 06/14/2019 confirmed severe OSA with AHI of 39. Recommend auto cpap 5-20cm h2O. Order has been placed for cpap, as pt wished to proceed. Nothing further is needed at this time.

## 2019-07-19 DIAGNOSIS — G4733 Obstructive sleep apnea (adult) (pediatric): Secondary | ICD-10-CM | POA: Diagnosis not present

## 2019-07-27 ENCOUNTER — Ambulatory Visit (INDEPENDENT_AMBULATORY_CARE_PROVIDER_SITE_OTHER): Payer: PPO | Admitting: Internal Medicine

## 2019-07-27 ENCOUNTER — Other Ambulatory Visit: Payer: Self-pay

## 2019-07-27 ENCOUNTER — Encounter (INDEPENDENT_AMBULATORY_CARE_PROVIDER_SITE_OTHER): Payer: Self-pay

## 2019-07-27 ENCOUNTER — Ambulatory Visit
Admission: RE | Admit: 2019-07-27 | Discharge: 2019-07-27 | Disposition: A | Discharge status: Home / Self Care | Payer: PPO | Source: Ambulatory Visit | Attending: Internal Medicine | Admitting: Internal Medicine

## 2019-07-27 ENCOUNTER — Encounter: Payer: Self-pay | Admitting: Internal Medicine

## 2019-07-27 DIAGNOSIS — M7989 Other specified soft tissue disorders: Secondary | ICD-10-CM

## 2019-07-27 DIAGNOSIS — E785 Hyperlipidemia, unspecified: Secondary | ICD-10-CM | POA: Diagnosis not present

## 2019-07-27 DIAGNOSIS — I10 Essential (primary) hypertension: Secondary | ICD-10-CM | POA: Diagnosis not present

## 2019-07-27 DIAGNOSIS — M542 Cervicalgia: Secondary | ICD-10-CM

## 2019-07-27 DIAGNOSIS — N400 Enlarged prostate without lower urinary tract symptoms: Secondary | ICD-10-CM | POA: Diagnosis not present

## 2019-07-27 MED ORDER — TIZANIDINE HCL 2 MG PO TABS: 2.0000 mg | ORAL_TABLET | Freq: Every evening | ORAL | 0 refills | Status: DC | PRN

## 2019-07-27 NOTE — Progress Notes (Signed)
Patient ID: Brian Pearson, male   DOB: Apr 22, 1936, 83 y.o.   MRN: [id number]   Virtual Visit via video Note  This visit type was conducted due to national recommendations for restrictions regarding the COVID-19 pandemic (e.g. social distancing).  This format is felt to be most appropriate for this patient at this time.  All issues noted in this document were discussed and addressed.  No physical exam was performed (except for noted visual exam findings with Video Visits).   I connected with Brian Pearson by a video enabled telemedicine application and verified that I am speaking with the correct person using two identifiers. Location patient: home Location provider: work Persons participating in the virtual visit: patient, provider  I discussed the limitations, risks, security and privacy concerns of performing an evaluation and management service by video and the availability of in person appointments. The patient expressed understanding and agreed to proceed.   Reason for visit:  Scheduled follow up.    HPI: States he is doing relatively well.  Saw Dr Mortimer Fries 06/06/19.  Sleep study - severe sleep apnea.  Using cpap.  Saw urology 05/01/19 - BPH.  On finasteride/tamsulosin. Recommended f/u in one year.  Trying to stay active.  Has over the last 2 weeks - noticed increased left neck pain and pain extending into posterior shoulder.  Limited rom - especially when looking to the left.  Some better now.  Taking tylenol.  Affecting how he is lying and sleeping.  Increased pain with rotation of head to left.  No injury.  No known trigger.  Also has noticed over the last several weeks, increased swelling right foot and lower leg.  No redness.  No chest pain.  No sob.  No acid reflux.  No abdominal pain.  Bowels moving.     ROS: See pertinent positives and negatives per HPI.  Past Medical History:  Diagnosis Date  . Arthritis   . BPH with obstruction/lower urinary tract symptoms   . GERD  (gastroesophageal reflux disease)   . Hyperlipidemia    taken off simvastatin last year  . Hypertension   . Obesity   . Pneumonia    hospitalized   . Urinary frequency   . Urine incontinence     Past Surgical History:  Procedure Laterality Date  . COLONOSCOPY WITH PROPOFOL N/A 02/09/2016   Procedure: COLONOSCOPY WITH PROPOFOL;  Surgeon: Hulen Luster, MD;  Location: [facility] ENDOSCOPY;  Service: Gastroenterology;  Laterality: N/A;  . ESOPHAGOGASTRODUODENOSCOPY (EGD) WITH PROPOFOL N/A 02/09/2016   Procedure: ESOPHAGOGASTRODUODENOSCOPY (EGD) WITH PROPOFOL;  Surgeon: Hulen Luster, MD;  Location: [facility] ENDOSCOPY;  Service: Gastroenterology;  Laterality: N/A;  . HERNIA REPAIR     1990's - 3 surgeries    Family History  Problem Relation Age of Onset  . Arthritis Mother   . Heart disease Mother        CHF  . Cancer Father        Prostate  . Dementia Father   . Alzheimer's disease Father   . Hypotension Sister   . Kidney cancer Neg Hx   . Bladder Cancer Neg Hx     SOCIAL HX: reviewed.    Current Outpatient Medications:  .  amLODipine (NORVASC) 2.5 MG tablet, Take 1 tablet by mouth once daily, Disp: 90 tablet, Rfl: 0 .  aspirin EC 81 MG tablet, Take by mouth., Disp: , Rfl:  .  Cholecalciferol (VITAMIN D-3) 1000 units CAPS, Take 1 capsule by mouth., Disp: , Rfl:  .  Coenzyme Q10 (COQ10 PO), Take 1 capsule by mouth daily., Disp: , Rfl:  .  fesoterodine (TOVIAZ) 4 MG TB24 tablet, Take 1 tablet (4 mg total) by mouth daily., Disp: 90 tablet, Rfl: 3 .  finasteride (PROSCAR) 5 MG tablet, Take 1 tablet (5 mg total) by mouth daily., Disp: 90 tablet, Rfl: 3 .  Multiple Vitamin (MULTI-VITAMINS) TABS, Take by mouth., Disp: , Rfl:  .  Omega-3 Fatty Acids (FISH OIL PO), Take by mouth., Disp: , Rfl:  .  omeprazole (PRILOSEC) 20 MG capsule, TAKE 1 CAPSULE BY MOUTH ONCE DAILY, Disp: 90 capsule, Rfl: 1 .  simvastatin (ZOCOR) 20 MG tablet, TAKE 1 TABLET BY MOUTH AT BEDTIME NEEDS  APPOINTMENT  FOR  ADDITIONAL   OR  90  DAY  REFILLS, Disp: 90 tablet, Rfl: 0 .  tamsulosin (FLOMAX) 0.4 MG CAPS capsule, Take 1 capsule (0.4 mg total) by mouth daily., Disp: 90 capsule, Rfl: 3 .  tiZANidine (ZANAFLEX) 2 MG tablet, Take 1 tablet (2 mg total) by mouth at bedtime as needed for muscle spasms., Disp: 20 tablet, Rfl: 0  EXAM:  GENERAL: alert, oriented, appears well and in no acute distress  HEENT: atraumatic, conjunttiva clear, no obvious abnormalities on inspection of external nose and ears  NECK: normal movements of the head and neck  LUNGS: on inspection no signs of respiratory distress, breathing rate appears normal, no obvious gross SOB, gasping or wheezing  CV: no obvious cyanosis  MS: increased pain with rotation of head to left (looking to left).  Limited rom - looking to left. Good rom in shoulder.  Right calf larger than left with increased pedal and lower extremity swelling - right.    PSYCH/NEURO: pleasant and cooperative, no obvious depression or anxiety, speech and thought processing grossly intact  ASSESSMENT AND PLAN:  Discussed the following assessment and plan:  Benign prostatic hyperplasia without urinary obstruction Followed by urology.  On finasteride and tamsulosin.  Stable.    Essential hypertension Blood pressure has been under good control.  Continue same medication regimen.  Follow pressures.  Follow metabolic panel.    Hyperlipidemia Low cholesterol diet and exercise.  On simvastatin.  Follow lipid panel and liver function tests.    Neck pain Neck pain as outlined.  Noticed with rotation of head.  Continue tylenol.  Tizanidine at night.  Use cpap if takes muscle relaxer.  Update me over the next week.  If persistent problems, will need xray and further evaluation.    Swelling of right lower extremity Obtain ultrasound.  If negative, compression hose, leg elevation.  Follow.      I discussed the assessment and treatment plan with the patient. The patient was provided an  opportunity to ask questions and all were answered. The patient agreed with the plan and demonstrated an understanding of the instructions.   The patient was advised to call back or seek an in-person evaluation if the symptoms worsen or if the condition fails to improve as anticipated.   [name], MD

## 2019-07-29 DIAGNOSIS — M7989 Other specified soft tissue disorders: Secondary | ICD-10-CM | POA: Insufficient documentation

## 2019-07-29 DIAGNOSIS — M542 Cervicalgia: Secondary | ICD-10-CM | POA: Insufficient documentation

## 2019-07-29 NOTE — Assessment & Plan Note (Signed)
Obtain ultrasound.  If negative, compression hose, leg elevation.  Follow.

## 2019-07-29 NOTE — Assessment & Plan Note (Signed)
Neck pain as outlined.  Noticed with rotation of head.  Continue tylenol.  Tizanidine at night.  Use cpap if takes muscle relaxer.  Update me over the next week.  If persistent problems, will need xray and further evaluation.

## 2019-07-29 NOTE — Assessment & Plan Note (Signed)
Followed by urology.  On finasteride and tamsulosin.  Stable.

## 2019-07-29 NOTE — Assessment & Plan Note (Signed)
Blood pressure has been under good control.  Continue same medication regimen.  Follow pressures.  Follow metabolic panel.   

## 2019-07-29 NOTE — Assessment & Plan Note (Signed)
Low cholesterol diet and exercise.  On simvastatin.  Follow lipid panel and liver function tests.   

## 2019-08-03 ENCOUNTER — Telehealth: Payer: Self-pay | Admitting: Internal Medicine

## 2019-08-06 NOTE — Telephone Encounter (Signed)
Appointment schedule for fu for CPAP startup. Pt aware of appointment date and time. Pt was advised to just make sure this appointment is within 31-90 of starting his cpap as he was unable to remember date he started. Pt will verify and call back to reschedule if needed.

## 2019-08-09 ENCOUNTER — Other Ambulatory Visit: Payer: Self-pay | Admitting: Internal Medicine

## 2019-08-09 DIAGNOSIS — E785 Hyperlipidemia, unspecified: Secondary | ICD-10-CM

## 2019-08-19 DIAGNOSIS — G4733 Obstructive sleep apnea (adult) (pediatric): Secondary | ICD-10-CM | POA: Diagnosis not present

## 2019-08-30 ENCOUNTER — Ambulatory Visit: Payer: PPO | Admitting: Internal Medicine

## 2019-08-30 ENCOUNTER — Other Ambulatory Visit: Payer: Self-pay

## 2019-08-30 ENCOUNTER — Encounter: Payer: Self-pay | Admitting: Internal Medicine

## 2019-08-30 VITALS — BP 138/78 | Temp 97.3°F | Ht 73.0 in | Wt 237.0 lb

## 2019-08-30 DIAGNOSIS — G4733 Obstructive sleep apnea (adult) (pediatric): Secondary | ICD-10-CM | POA: Diagnosis not present

## 2019-08-30 NOTE — Progress Notes (Signed)
Name: Brian Pearson MRN: [id number] DOB: May 02, 1936     CONSULTATION DATE: 08/30/2019  REFERRING MD :[name]  CHIEF COMPLAINT:  Follow up excessive daytime sleepiness  HISTORY OF PRESENT ILLNESS:  Sleep study results and compliance report thoroughly reviewed with the patient Patient diagnosed with severe sleep apnea with AHI of 40 Patient had 300 total episodes of apnea and hypopnea Patient had a 178 desaturation episodes  Patient started on auto CPAP therapy 5 to 20 cm of water pressure Patient has excellent compliance report at 100% AHI is down to 14  Overall patient feels so much better Excessive daytime sleepiness has pretty much resolved He has more refreshed sleep  No signs of shortness of breath No evidence of heart failure No evidence of lower extremity edema No signs of infection   Discussed sleep data and reviewed with patient.  Encouraged proper weight management.  Discussed driving precautions and its relationship with hypersomnolence.  Discussed operating dangerous equipment and its relationship with hypersomnolence.  Discussed sleep hygiene, and benefits of a fixed sleep waked time.  The importance of getting eight or more hours of sleep discussed with patient.  Discussed limiting the use of the computer and television before bedtime.  Decrease naps during the day, so night time sleep will become enhanced.  Limit caffeine, and sleep deprivation.  HTN, stroke, and heart failure are potential risk factors.      PAST MEDICAL HISTORY :   has a past medical history of Arthritis, BPH with obstruction/lower urinary tract symptoms, GERD (gastroesophageal reflux disease), Hyperlipidemia, Hypertension, Obesity, Pneumonia, Urinary frequency, and Urine incontinence.  has a past surgical history that includes Hernia repair; Colonoscopy with propofol (N/A, 02/09/2016); and Esophagogastroduodenoscopy (egd) with propofol (N/A, 02/09/2016). Prior to Admission  medications   Medication Sig Start Date End Date Taking? Authorizing Provider  amLODipine (NORVASC) 2.5 MG tablet TAKE 1 TABLET BY MOUTH ONCE DAILY 12/18/18   [name], MD  aspirin EC 81 MG tablet Take by mouth.    [provider]  Cholecalciferol (VITAMIN D-3) 1000 units CAPS Take 1 capsule by mouth.    [provider]  Coenzyme Q10 (COQ10 PO) Take 1 capsule by mouth daily.    [provider]  fesoterodine (TOVIAZ) 4 MG TB24 tablet Take 1 tablet (4 mg total) by mouth daily. 05/22/18   [name], PA-C  finasteride (PROSCAR) 5 MG tablet Take 1 tablet (5 mg total) by mouth daily. 05/01/19   [name], PA-C  Multiple Vitamin (MULTI-VITAMINS) TABS Take by mouth.    [provider]  Omega-3 Fatty Acids (FISH OIL PO) Take by mouth.    [provider]  omeprazole (PRILOSEC) 20 MG capsule TAKE 1 CAPSULE BY MOUTH ONCE DAILY 11/14/18   [name], MD  simvastatin (ZOCOR) 20 MG tablet TAKE 1 TABLET BY MOUTH AT BEDTIME NEEDS  APPOINTMENT  FOR  ADDITIONAL  OR  90  DAY  REFILLS 04/04/19   [name], MD  tamsulosin (FLOMAX) 0.4 MG CAPS capsule Take 1 capsule (0.4 mg total) by mouth daily. 05/01/19   [name], PA-C   No Known Allergies SOCIAL HISTORY:  reports that he has never smoked. He has never used smokeless tobacco. He reports current alcohol use. He reports that he does not use drugs.    Review of Systems:  Gen:  Denies  fever, sweats, chills weight loss  HEENT: Denies blurred vision, double vision, ear pain, eye pain, hearing loss, nose bleeds, sore throat  Cardiac:  No dizziness, chest pain or heaviness, chest tightness,edema, No JVD Resp:   No cough, -sputum production, -shortness of breath,-wheezing, -hemoptysis,  Gi: Denies swallowing difficulty, stomach pain, nausea or vomiting, diarrhea, constipation, bowel incontinence Gu:  Denies bladder incontinence, burning urine Ext:   Denies Joint pain, stiffness or  swelling Skin: Denies  skin rash, easy bruising or bleeding or hives Endoc:  Denies polyuria, polydipsia , polyphagia or weight change Psych:   Denies depression, insomnia or hallucinations  Other:  All other systems negative    BP 138/78   Temp (!) 97.3 F (36.3 C) (Temporal)   Ht 6\' 1"  (1.854 m)   Wt 237 lb (107.5 kg)   SpO2 97%   BMI 31.27 kg/m    Physical Examination:   GENERAL:NAD, no fevers, chills, no weakness no fatigue HEAD: Normocephalic, atraumatic.  EYES: PERLA, EOMI No scleral icterus.  MOUTH: Moist mucosal membrane.  EAR, NOSE, THROAT: Clear without exudates. No external lesions.  NECK: Supple. No thyromegaly.  No JVD.  PULMONARY: CTA B/L no wheezing, rhonchi, crackles CARDIOVASCULAR: S1 and S2. Regular rate and rhythm. No murmurs GASTROINTESTINAL: Soft, nontender, nondistended. Positive bowel sounds.  MUSCULOSKELETAL: No swelling, clubbing, or edema.  NEUROLOGIC: No gross focal neurological deficits. 5/5 strength all extremities SKIN: No ulceration, lesions, rashes, or cyanosis.  PSYCHIATRIC: Insight, judgment intact. -depression -anxiety ALL OTHER ROS ARE NEGATIVE      ASSESSMENT AND PLAN SYNOPSIS  SEVERE OSA Patient uses and benefits from auto CPAP therapy Patient's AHI has significantly improved His symptoms have significant improved Patient uses and benefits from auto CPAP therapy  Sleep study results on compliance report reviewed thoroughly with patient Patient has excellent compliance report   COVID-19 EDUCATION: The signs and symptoms of COVID-19 were discussed with the patient and how to seek care for testing.  The importance of social distancing was discussed today. Hand Washing Techniques and avoid touching face was advised.  MEDICATION ADJUSTMENTS/LABS AND TESTS ORDERED: Continue CPAP as prescribed  CURRENT MEDICATIONS REVIEWED AT LENGTH WITH PATIENT TODAY   Patient satisfied with Plan of action and management. All questions  answered Follow up in 6 months   Jeylin Woodmansee Patricia Pesa, M.D.  [name]  Medical Director [place] Director [facility] Cardio-Pulmonary Department

## 2019-08-30 NOTE — Patient Instructions (Signed)
Continue CPAP as prescribed. 

## 2019-09-03 ENCOUNTER — Ambulatory Visit (INDEPENDENT_AMBULATORY_CARE_PROVIDER_SITE_OTHER): Payer: PPO | Admitting: Internal Medicine

## 2019-09-03 ENCOUNTER — Other Ambulatory Visit: Payer: Self-pay

## 2019-09-03 DIAGNOSIS — M542 Cervicalgia: Secondary | ICD-10-CM | POA: Diagnosis not present

## 2019-09-03 DIAGNOSIS — I10 Essential (primary) hypertension: Secondary | ICD-10-CM | POA: Diagnosis not present

## 2019-09-03 DIAGNOSIS — E785 Hyperlipidemia, unspecified: Secondary | ICD-10-CM | POA: Diagnosis not present

## 2019-09-03 NOTE — Progress Notes (Signed)
Patient ID: Brian Pearson, male   DOB: 1936/07/17, 83 y.o.   MRN: [id number]   Virtual Visit via video Note  This visit type was conducted due to national recommendations for restrictions regarding the COVID-19 pandemic (e.g. social distancing).  This format is felt to be most appropriate for this patient at this time.  All issues noted in this document were discussed and addressed.  No physical exam was performed (except for noted visual exam findings with Video Visits).   I connected with Laurina Bustle by a video enabled telemedicine application and verified that I am speaking with the correct person using two identifiers. Location patient: home Location provider: work Persons participating in the virtual visit: patient, provider  I discussed the limitations, risks, security and privacy concerns of performing an evaluation and management service by video and the availability of in person appointments.  The patient expressed understanding and agreed to proceed.   Reason for visit: work in appt.   HPI: He reports persistent neck pain.  Present last visit.  States muscle relaxer did not make a big difference.  Still with increased pain/discomfort with rotation of his head.  Pain more localized to left side/posterior.  No headache.  No chest pain.  Affecting his sleep.  Unable to sleep on his right side.  No pain going down into his arm.  No numbness or tingling.  Limited rom.  No chestpain.  No sob.  Eating.  Using cpap.     ROS: See pertinent positives and negatives per HPI.  Past Medical History:  Diagnosis Date  . Arthritis   . BPH with obstruction/lower urinary tract symptoms   . GERD (gastroesophageal reflux disease)   . Hyperlipidemia    taken off simvastatin last year  . Hypertension   . Obesity   . Pneumonia    hospitalized   . Urinary frequency   . Urine incontinence     Past Surgical History:  Procedure Laterality Date  . COLONOSCOPY WITH PROPOFOL N/A 02/09/2016   Procedure: COLONOSCOPY WITH PROPOFOL;  Surgeon: Hulen Luster, MD;  Location: [facility] ENDOSCOPY;  Service: Gastroenterology;  Laterality: N/A;  . ESOPHAGOGASTRODUODENOSCOPY (EGD) WITH PROPOFOL N/A 02/09/2016   Procedure: ESOPHAGOGASTRODUODENOSCOPY (EGD) WITH PROPOFOL;  Surgeon: Hulen Luster, MD;  Location: [facility] ENDOSCOPY;  Service: Gastroenterology;  Laterality: N/A;  . HERNIA REPAIR     1990's - 3 surgeries    Family History  Problem Relation Age of Onset  . Arthritis Mother   . Heart disease Mother        CHF  . Cancer Father        Prostate  . Dementia Father   . Alzheimer's disease Father   . Hypotension Sister   . Kidney cancer Neg Hx   . Bladder Cancer Neg Hx     SOCIAL HX: reviewed.    Current Outpatient Medications:  .  amLODipine (NORVASC) 2.5 MG tablet, Take 1 tablet by mouth once daily, Disp: 90 tablet, Rfl: 0 .  aspirin EC 81 MG tablet, Take by mouth., Disp: , Rfl:  .  Cholecalciferol (VITAMIN D-3) 1000 units CAPS, Take 1 capsule by mouth., Disp: , Rfl:  .  Coenzyme Q10 (COQ10 PO), Take 1 capsule by mouth daily., Disp: , Rfl:  .  fesoterodine (TOVIAZ) 4 MG TB24 tablet, Take 1 tablet (4 mg total) by mouth daily., Disp: 90 tablet, Rfl: 3 .  finasteride (PROSCAR) 5 MG tablet, Take 1 tablet (5 mg total) by mouth daily., Disp: 90 tablet,  Rfl: 3 .  Multiple Vitamin (MULTI-VITAMINS) TABS, Take by mouth., Disp: , Rfl:  .  Omega-3 Fatty Acids (FISH OIL PO), Take by mouth., Disp: , Rfl:  .  omeprazole (PRILOSEC) 20 MG capsule, TAKE 1 CAPSULE BY MOUTH ONCE DAILY, Disp: 90 capsule, Rfl: 1 .  simvastatin (ZOCOR) 20 MG tablet, TAKE 1 TABLET BY MOUTH ONCE DAILY AT BEDTIME -  NEED  APPT, Disp: 90 tablet, Rfl: 0 .  tamsulosin (FLOMAX) 0.4 MG CAPS capsule, Take 1 capsule (0.4 mg total) by mouth daily., Disp: 90 capsule, Rfl: 3 .  tiZANidine (ZANAFLEX) 2 MG tablet, Take 1 tablet (2 mg total) by mouth at bedtime as needed for muscle spasms., Disp: 20 tablet, Rfl: 0  EXAM:  GENERAL: alert,  oriented, appears well and in no acute distress  HEENT: atraumatic, conjunttiva clear, no obvious abnormalities on inspection of external nose and ears  NECK: normal movements of the head and neck  LUNGS: on inspection no signs of respiratory distress, breathing rate appears normal, no obvious gross SOB, gasping or wheezing  CV: no obvious cyanosis  MS: increased pain with rotation of his head.  Increased pain with looking right and left.  Limited rom.    PSYCH/NEURO: pleasant and cooperative, no obvious depression or anxiety, speech and thought processing grossly intact  ASSESSMENT AND PLAN:  Discussed the following assessment and plan:  Neck pain Persistent increased neck pain.  Limited rom - especially when looking to the left.  Check c-spine xray.  Continue tylenol.    Essential hypertension Blood pressure has been doing well.  Continue current medication regimen.  Follow pressures.  Follow metabolic panel.    Hyperlipidemia On simvastatin.  Low cholesterol diet and exercise.  Follow lipid panel and liver function tests.      I discussed the assessment and treatment plan with the patient. The patient was provided an opportunity to ask questions and all were answered. The patient agreed with the plan and demonstrated an understanding of the instructions.   The patient was advised to call back or seek an in-person evaluation if the symptoms worsen or if the condition fails to improve as anticipated.   [name], MD

## 2019-09-04 ENCOUNTER — Other Ambulatory Visit: Payer: Self-pay

## 2019-09-04 ENCOUNTER — Ambulatory Visit
Admission: RE | Admit: 2019-09-04 | Discharge: 2019-09-04 | Disposition: A | Discharge status: Home / Self Care | Payer: PPO | Source: Ambulatory Visit | Attending: Internal Medicine | Admitting: Internal Medicine

## 2019-09-04 DIAGNOSIS — M542 Cervicalgia: Secondary | ICD-10-CM

## 2019-09-04 DIAGNOSIS — M47812 Spondylosis without myelopathy or radiculopathy, cervical region: Secondary | ICD-10-CM | POA: Diagnosis not present

## 2019-09-04 NOTE — Assessment & Plan Note (Signed)
Persistent increased neck pain.  Limited rom - especially when looking to the left.  Check c-spine xray.  Continue tylenol.

## 2019-09-05 ENCOUNTER — Other Ambulatory Visit: Payer: Self-pay | Admitting: Internal Medicine

## 2019-09-05 DIAGNOSIS — M542 Cervicalgia: Secondary | ICD-10-CM

## 2019-09-05 NOTE — Progress Notes (Signed)
MRI c-spine ordered. 

## 2019-09-09 ENCOUNTER — Encounter: Payer: Self-pay | Admitting: Internal Medicine

## 2019-09-09 NOTE — Assessment & Plan Note (Signed)
On simvastatin.  Low cholesterol diet and exercise.  Follow lipid panel and liver function tests.   

## 2019-09-09 NOTE — Assessment & Plan Note (Signed)
Blood pressure has been doing well.  Continue current medication regimen.  Follow pressures.  Follow metabolic panel.  

## 2019-09-12 ENCOUNTER — Telehealth: Payer: Self-pay

## 2019-09-12 NOTE — Telephone Encounter (Signed)
Checking on status of this? It looks like it was just placed on 9/17

## 2019-09-12 NOTE — Telephone Encounter (Signed)
Copied from [place] [id number]. Topic: General - Other >> Sep 12, 2019  9:57 AM Carolyn Stare wrote: Pt wife call to say Dr Nicki Reaper was suppose to order a MRI and they have not received a call about the MRI, she said pt is still having neck pain

## 2019-09-13 NOTE — Telephone Encounter (Signed)
I left a detailed vm on his phone 9/23. I will call them back to make sure that he is aware of appt

## 2019-09-19 DIAGNOSIS — G4733 Obstructive sleep apnea (adult) (pediatric): Secondary | ICD-10-CM | POA: Diagnosis not present

## 2019-09-20 ENCOUNTER — Other Ambulatory Visit: Payer: Self-pay | Admitting: Internal Medicine

## 2019-09-22 ENCOUNTER — Ambulatory Visit
Admission: RE | Admit: 2019-09-22 | Discharge: 2019-09-22 | Disposition: A | Discharge status: Home / Self Care | Payer: PPO | Source: Ambulatory Visit | Attending: Internal Medicine | Admitting: Internal Medicine

## 2019-09-22 ENCOUNTER — Other Ambulatory Visit: Payer: Self-pay

## 2019-09-22 DIAGNOSIS — M542 Cervicalgia: Secondary | ICD-10-CM | POA: Insufficient documentation

## 2019-09-26 ENCOUNTER — Other Ambulatory Visit: Payer: Self-pay | Admitting: Internal Medicine

## 2019-09-26 DIAGNOSIS — M4802 Spinal stenosis, cervical region: Secondary | ICD-10-CM

## 2019-09-26 NOTE — Progress Notes (Signed)
Order placed for neurosurgery referral 

## 2019-10-03 ENCOUNTER — Other Ambulatory Visit: Payer: Self-pay

## 2019-10-05 ENCOUNTER — Ambulatory Visit (INDEPENDENT_AMBULATORY_CARE_PROVIDER_SITE_OTHER): Payer: PPO

## 2019-10-05 ENCOUNTER — Encounter: Payer: Self-pay | Admitting: Internal Medicine

## 2019-10-05 ENCOUNTER — Other Ambulatory Visit: Payer: Self-pay

## 2019-10-05 ENCOUNTER — Ambulatory Visit (INDEPENDENT_AMBULATORY_CARE_PROVIDER_SITE_OTHER): Payer: PPO | Admitting: Internal Medicine

## 2019-10-05 VITALS — BP 130/70 | HR 63 | Temp 97.3°F | Resp 16 | Ht 73.0 in | Wt 233.8 lb

## 2019-10-05 DIAGNOSIS — M542 Cervicalgia: Secondary | ICD-10-CM

## 2019-10-05 DIAGNOSIS — Z0000 Encounter for general adult medical examination without abnormal findings: Secondary | ICD-10-CM

## 2019-10-05 DIAGNOSIS — Z9989 Dependence on other enabling machines and devices: Secondary | ICD-10-CM

## 2019-10-05 DIAGNOSIS — Z125 Encounter for screening for malignant neoplasm of prostate: Secondary | ICD-10-CM | POA: Diagnosis not present

## 2019-10-05 DIAGNOSIS — I10 Essential (primary) hypertension: Secondary | ICD-10-CM | POA: Diagnosis not present

## 2019-10-05 DIAGNOSIS — G4733 Obstructive sleep apnea (adult) (pediatric): Secondary | ICD-10-CM | POA: Diagnosis not present

## 2019-10-05 DIAGNOSIS — M25562 Pain in left knee: Secondary | ICD-10-CM

## 2019-10-05 DIAGNOSIS — E785 Hyperlipidemia, unspecified: Secondary | ICD-10-CM | POA: Diagnosis not present

## 2019-10-05 DIAGNOSIS — Z23 Encounter for immunization: Secondary | ICD-10-CM

## 2019-10-05 LAB — HEPATIC FUNCTION PANEL
ALT: 17 U/L (ref 0–53)
AST: 17 U/L (ref 0–37)
Albumin: 4.4 g/dL (ref 3.5–5.2)
Alkaline Phosphatase: 72 U/L (ref 39–117)
Bilirubin, Direct: 0.2 mg/dL (ref 0.0–0.3)
Total Bilirubin: 0.9 mg/dL (ref 0.2–1.2)
Total Protein: 6.6 g/dL (ref 6.0–8.3)

## 2019-10-05 LAB — BASIC METABOLIC PANEL
BUN: 14 mg/dL (ref 6–23)
CO2: 27 mEq/L (ref 19–32)
Calcium: 9.1 mg/dL (ref 8.4–10.5)
Chloride: 105 mEq/L (ref 96–112)
Creatinine, Ser: 0.95 mg/dL (ref 0.40–1.50)
GFR: 75.98 mL/min (ref >60.00)
Glucose, Bld: 97 mg/dL (ref 70–99)
Potassium: 4.1 mEq/L (ref 3.5–5.1)
Sodium: 140 mEq/L (ref 135–145)

## 2019-10-05 LAB — CBC WITH DIFFERENTIAL/PLATELET
Basophils Absolute: 0 10*3/uL (ref 0.0–0.1)
Basophils Relative: 0.4 % (ref 0.0–3.0)
Eosinophils Absolute: 0.1 10*3/uL (ref 0.0–0.7)
Eosinophils Relative: 2.9 % (ref 0.0–5.0)
HCT: 41.7 % (ref 39.0–52.0)
Hemoglobin: 13.9 g/dL (ref 13.0–17.0)
Lymphocytes Relative: 37.4 % (ref 12.0–46.0)
Lymphs Abs: 1.9 10*3/uL (ref 0.7–4.0)
MCHC: 33.4 g/dL (ref 30.0–36.0)
MCV: 94.7 fl (ref 78.0–100.0)
Monocytes Absolute: 0.6 10*3/uL (ref 0.1–1.0)
Monocytes Relative: 11.7 % (ref 3.0–12.0)
Neutro Abs: 2.4 10*3/uL (ref 1.4–7.7)
Neutrophils Relative %: 47.6 % (ref 43.0–77.0)
Platelets: 181 10*3/uL (ref 150.0–400.0)
RBC: 4.4 Mil/uL (ref 4.22–5.81)
RDW: 13.6 % (ref 11.5–15.5)
WBC: 5 10*3/uL (ref 4.0–10.5)

## 2019-10-05 LAB — LIPID PANEL
Cholesterol: 158 mg/dL (ref 0–200)
HDL: 50.4 mg/dL (ref >39.00)
LDL Cholesterol: 75 mg/dL (ref 0–99)
NonHDL: 107.24
Total CHOL/HDL Ratio: 3
Triglycerides: 161 mg/dL — ABNORMAL HIGH (ref 0.0–149.0)
VLDL: 32.2 mg/dL (ref 0.0–40.0)

## 2019-10-05 LAB — TSH: TSH: 2.6 u[IU]/mL (ref 0.35–4.50)

## 2019-10-05 LAB — PSA, MEDICARE: PSA: 1.08 ng/ml (ref 0.10–4.00)

## 2019-10-05 NOTE — Progress Notes (Addendum)
Subjective:   Brian Pearson is a 82 y.o. male who presents for Medicare Annual/Subsequent preventive examination.  Review of Systems:  No ROS.  Medicare Wellness Virtual Visit.  Visual/audio telehealth visit, UTA vital signs.   See social history for additional risk factors.   Cardiac Risk Factors include: advanced age (>63men, >63 women);hypertension;male gender     Objective:    Vitals: There were no vitals taken for this visit.  There is no height or weight on file to calculate BMI.  Advanced Directives 10/05/2019 08/31/2018 08/30/2017 08/30/2016 08/28/2015  Does Patient Have a Medical Advance Directive? Yes Yes Yes Yes Yes  Type of Paramedic of [place];Living will Living will;Healthcare Power of [place];Living will [place];Living will [place];Living will  Does patient want to make changes to medical advance directive? No - Patient declined No - Patient declined No - Patient declined - No - Patient declined  Copy of [place] in Chart? Yes - validated most recent copy scanned in chart (See row information) Yes Yes Yes No - copy requested    Tobacco Social History   Tobacco Use  Smoking Status Never Smoker  Smokeless Tobacco Never Used     Counseling given: Not Answered   Clinical Intake:  Pre-visit preparation completed: Yes        Diabetes: No  How often do you need to have someone help you when you read instructions, pamphlets, or other written materials from your doctor or pharmacy?: 1 - Never  Interpreter Needed?: No     Past Medical History:  Diagnosis Date  . Arthritis   . BPH with obstruction/lower urinary tract symptoms   . GERD (gastroesophageal reflux disease)   . Hyperlipidemia    taken off simvastatin last year  . Hypertension   . Obesity   . Pneumonia    hospitalized   . Urinary frequency   . Urine incontinence    Past  Surgical History:  Procedure Laterality Date  . COLONOSCOPY WITH PROPOFOL N/A 02/09/2016   Procedure: COLONOSCOPY WITH PROPOFOL;  Surgeon: Hulen Luster, MD;  Location: [facility] ENDOSCOPY;  Service: Gastroenterology;  Laterality: N/A;  . ESOPHAGOGASTRODUODENOSCOPY (EGD) WITH PROPOFOL N/A 02/09/2016   Procedure: ESOPHAGOGASTRODUODENOSCOPY (EGD) WITH PROPOFOL;  Surgeon: Hulen Luster, MD;  Location: [facility] ENDOSCOPY;  Service: Gastroenterology;  Laterality: N/A;  . HERNIA REPAIR     1990's - 3 surgeries   Family History  Problem Relation Age of Onset  . Arthritis Mother   . Heart disease Mother        CHF  . Cancer Father        Prostate  . Dementia Father   . Alzheimer's disease Father   . Hypotension Sister   . Kidney cancer Neg Hx   . Bladder Cancer Neg Hx    Social History   Socioeconomic History  . Marital status: Married    Spouse name: Not on file  . Number of children: Not on file  . Years of education: Not on file  . Highest education level: Not on file  Occupational History  . Not on file  Social Needs  . Financial resource strain: Not hard at all  . Food insecurity    Worry: Never true    Inability: Never true  . Transportation needs    Medical: No    Non-medical: No  Tobacco Use  . Smoking status: Never Smoker  . Smokeless tobacco:  Never Used  Substance and Sexual Activity  . Alcohol use: Yes    Alcohol/week: 0.0 standard drinks    Comment: OCC glass of wine  . Drug use: No  . Sexual activity: Yes  Lifestyle  . Physical activity    Days per week: 5 days    Minutes per session: 30 min  . Stress: Not at all  Relationships  . Social Herbalist on phone: Not on file    Gets together: Not on file    Attends religious service: Not on file    Active member of club or organization: Not on file    Attends meetings of clubs or organizations: Not on file    Relationship status: Not on file  Other Topics Concern  . Not on file  Social History Narrative   Lives  in [place].      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 amLODipine (NORVASC) 2.5 MG tablet Take 1 tablet by mouth once daily  . aspirin EC 81 MG tablet Take by mouth.  . Cholecalciferol (VITAMIN D-3) 1000 units CAPS Take 1 capsule by mouth.  . Coenzyme Q10 (COQ10 PO) Take 1 capsule by mouth daily.  . fesoterodine (TOVIAZ) 4 MG TB24 tablet Take 1 tablet (4 mg total) by mouth daily.  . finasteride (PROSCAR) 5 MG tablet Take 1 tablet (5 mg total) by mouth daily.  . Multiple Vitamin (MULTI-VITAMINS) TABS Take by mouth.  . Omega-3 Fatty Acids (FISH OIL PO) Take by mouth.  Marland Kitchen omeprazole (PRILOSEC) 20 MG capsule TAKE 1 CAPSULE BY MOUTH ONCE DAILY  . simvastatin (ZOCOR) 20 MG tablet TAKE 1 TABLET BY MOUTH ONCE DAILY AT BEDTIME -  NEED  APPT  . tamsulosin (FLOMAX) 0.4 MG CAPS capsule Take 1 capsule (0.4 mg total) by mouth daily.  Marland Kitchen tiZANidine (ZANAFLEX) 2 MG tablet Take 1 tablet (2 mg total) by mouth at bedtime as needed for muscle spasms.   No facility-administered encounter medications on file as of 10/05/2019.     Activities of Daily Living In your present state of health, do you have any difficulty performing the following activities: 10/05/2019  Hearing? N  Vision? N  Difficulty concentrating or making decisions? N  Walking or climbing stairs? N  Dressing or bathing? N  Doing errands, shopping? N  Preparing Food and eating ? N  Using the Toilet? N  In the past six months, have you accidently leaked urine? N  Do you have problems with loss of bowel control? N  Managing your Medications? N  Managing your Finances? N  Housekeeping or managing your Housekeeping? N  Some recent data might be hidden    Patient Care Team: [name], MD as PCP - General (Internal  Medicine)   Assessment:   This is a routine wellness examination for Brian Pearson.  I connected with patient 10/05/19 at  9:00 AM EDT by an audio enabled telemedicine application and verified that I am speaking with the correct person using two identifiers. Patient stated full name and DOB. Patient gave permission to continue with virtual visit. Patient's location was at home and Nurse's location was at [place] office.   Health Maintenance  Due: -Influenza vaccine 2020- discussed; to be completed in season with doctor or local pharmacy.   Update all pending maintenance due as appropriate.   See completed HM at the end of note.   Eye: Visual acuity not assessed. Virtual visit. Wears corrective lenses. Followed by their ophthalmologist every 12 months.   Dental: Dentures- yes  Hearing: Hearing aids- yes. Followed by [facility] in [place].  Safety:  Patient feels safe at home- yes Patient does have smoke detectors at home- yes Patient does wear sunscreen or protective clothing when in direct sunlight - yes Patient does wear seat belt when in a moving vehicle - yes Patient drives- yes Adequate lighting in walkways free from debris- yes Grab bars and handrails used as appropriate- yes Ambulates with no assistive device  Social: Alcohol intake - yes      Smoking history- never   Smokers in home? none Illicit drug use? none  Depression: PHQ 2 &9 complete. See screening below. Denies irritability, anhedonia, sadness/tearfullness.    Falls: See screening below.    Medication: Taking as directed and without issues.   Covid-19: Precautions and sickness symptoms discussed. Wears mask, social distancing, hand hygiene as appropriate.   Activities of Daily Living Patient denies needing assistance with: household chores, feeding themselves, getting from bed to chair, getting to the toilet, bathing/showering, dressing, managing money, or preparing meals.   Memory: Patient is alert.  Patient denies difficulty focusing or concentrating. Correctly identified the president of the [place], season and recall 3/3. Patient likes to read and plays word search for brain stimulation.  BMI- discussed the importance of a healthy diet, water intake and the benefits of aerobic exercise.  Educational material provided.  Physical activity- walking, arm weights, stationary bike, stretches, 30 minutes  Diet: regular Water: good intake  Other Providers Patient Care Team: [name], MD as PCP - General (Internal Medicine)  Exercise Activities and Dietary recommendations Current Exercise Habits: Home exercise routine, Type of exercise: walking;strength training/weights;calisthenics;stretching, Time (Minutes): 30, Frequency (Times/Week): 5, Weekly Exercise (Minutes/Week): 150, Intensity: Mild  Goals    . Low Carb Diet       Fall Risk Fall Risk  10/05/2019 08/31/2018 08/30/2017 10/06/2016 08/30/2016  Falls in the past year? 0 No No No No    Timed Get Up and Go Performed: no, virtual visit  Depression Screen PHQ 2/9 Scores 10/05/2019 08/31/2018 08/30/2017 07/19/2017  PHQ - 2 Score 0 0 0 0  PHQ- 9 Score - - 0 0    Cognitive Function MMSE - Mini Mental State Exam 08/30/2016 08/28/2015  Orientation to time 5 5  Orientation to Place 5 5  Registration 3 3  Attention/ Calculation 5 5  Recall 3 3  Language- name 2 objects 2 2  Language- repeat 1 1  Language- follow 3 step command 3 3  Language- read & follow direction 1 1  Write a sentence 1 1  Copy design 1 1  Total score 30 30     6CIT Screen 10/05/2019 08/31/2018 08/30/2017  What Year? 0 points 0 points 0 points  What month? 0 points 0 points 0 points  What time? 0 points 0 points 0 points  Count back from 20 0 points 0 points 0 points  Months in reverse 0 points 2 points 0 points  Repeat phrase - 0 points 0 points  Total Score - 2 0    Immunization History  Administered Date(s) Administered  . Influenza, High Dose  Seasonal PF 08/28/2015,  08/30/2016, 08/30/2017, 08/31/2018  . Influenza-Unspecified 09/21/2014  . Pneumococcal Conjugate-13 10/22/2014  . Pneumococcal-Unspecified 10/23/2011  . Tdap 08/28/2015   Screening Tests Health Maintenance  Topic Date Due  . INFLUENZA VACCINE  03/19/2020 (Originally 07/21/2019)  . TETANUS/TDAP  08/27/2025  . PNA vac Low Risk Adult  Completed       Plan:   Keep all routine maintenance appointments.   Follow up today with your doctor at 10:00. Flu vaccine.   Medicare Attestation I have personally reviewed: The patient's medical and social history Their use of alcohol, tobacco or illicit drugs Their current medications and supplements The patient's functional ability including ADLs,fall risks, home safety risks, cognitive, and hearing and visual impairment Diet and physical activities Evidence for depression    In addition, I have reviewed and discussed with patient certain preventive protocols, quality metrics, and best practice recommendations. A written personalized care plan for preventive services as well as general preventive health recommendations were provided to patient via mail.     Varney Biles, LPN  [id number]   Reviewed above information.  Agree with assessment and plan.    Dr Nicki Reaper

## 2019-10-05 NOTE — Patient Instructions (Signed)
  Mr. Fahey , Thank you for taking time to come for your Medicare Wellness Visit. I appreciate your ongoing commitment to your health goals. Please review the following plan we discussed and let me know if I can assist you in the future.   These are the goals we discussed: Goals    . Low Carb Diet       This is a list of the screening recommended for you and due dates:  Health Maintenance  Topic Date Due  . Flu Shot  03/19/2020*  . Tetanus Vaccine  08/27/2025  . Pneumonia vaccines  Completed  *Topic was postponed. The date shown is not the original due date.

## 2019-10-05 NOTE — Progress Notes (Signed)
Patient ID: ZAHRAN BURFORD, male   DOB: [birth year], 83 y.o.   MRN: [id number]   Subjective:    Patient ID: EMILY KRELLER, male    DOB: 06-30-1936, 83 y.o.   MRN: [id number]  HPI  Patient here for a scheduled follow up.  Recently evaluated for persistent neck pain.  MRI with spinal stenosis and foraminal stenosis.  Referred to neurosurgery.  Was given flexeril.  Neck is better.  Has not seen [facility] yet.  Has appt next week.  Plans to keep appt.  Neck is better.  Still with limited rom.  No headache.  No arm pain, numbness or tingling.  No arm weakness reported.  Eating and drinking well.  No nausea or vomiting.  No chest pain.  No sob, cough or congestion.  No abdominal pain.  Bowels moving.  Some left knee pain.  S/p injection one year ago.  Some discomfort recently.  Wants to hold on referral back for now.  Will notify me when needs.  Blood pressures averaging 127-132/60-70s.  Discussed shingrix.     Past Medical History:  Diagnosis Date  . Arthritis   . BPH with obstruction/lower urinary tract symptoms   . GERD (gastroesophageal reflux disease)   . Hyperlipidemia    taken off simvastatin last year  . Hypertension   . Obesity   . Pneumonia    hospitalized   . Urinary frequency   . Urine incontinence    Past Surgical History:  Procedure Laterality Date  . COLONOSCOPY WITH PROPOFOL N/A 02/09/2016   Procedure: COLONOSCOPY WITH PROPOFOL;  Surgeon: Hulen Luster, MD;  Location[facility] ENDOSCOPY;  Service: Gastroenterology;  Laterality: N/A;  . ESOPHAGOGASTRODUODENOSCOPY (EGD) WITH PROPOFOL N/A 02/09/2016   Procedure: ESOPHAGOGASTRODUODENOSCOPY (EGD) WITH PROPOFOL;  Surgeon: Hulen Luster, MD;  Location: [facility] ENDOSCOPY;  Service: Gastroenterology;  Laterality: N/A;  . HERNIA REPAIR     1990's - 3 surgeries   Family History  Problem Relation Age of Onset  . Arthritis Mother   . Heart disease Mother        CHF  . Cancer Father        Prostate  . Dementia Father   . Alzheimer's disease Father    . Hypotension Sister   . Kidney cancer Neg Hx   . Bladder Cancer Neg Hx    Social History   Socioeconomic History  . Marital status: Married    Spouse name: Not on file  . Number of children: Not on file  . Years of education: Not on file  . Highest education level: Not on file  Occupational History  . Not on file  Social Needs  . Financial resource strain: Not hard at all  . Food insecurity    Worry: Never true    Inability: Never true  . Transportation needs    Medical: No    Non-medical: No  Tobacco Use  . Smoking status: Never Smoker  . Smokeless tobacco: Never Used  Substance and Sexual Activity  . Alcohol use: Yes    Alcohol/week: 0.0 standard drinks    Comment: OCC glass of wine  . Drug use: No  . Sexual activity: Yes  Lifestyle  . Physical activity    Days per week: 5 days    Minutes per session: 30 min  . Stress: Not at all  Relationships  . Social Herbalist on phone: Not on file    Gets together: Not on file    Attends  religious service: Not on file    Active member of club or organization: Not on file    Attends meetings of clubs or organizations: Not on file    Relationship status: Not on file  Other Topics Concern  . Not on file  Social History Narrative   Lives in [place].      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 amLODipine (NORVASC) 2.5 MG tablet Take 1 tablet by mouth once daily  . aspirin EC 81 MG tablet Take by mouth.  . Cholecalciferol (VITAMIN D-3) 1000 units CAPS Take 1 capsule by mouth.  . Coenzyme Q10 (COQ10 PO) Take 1 capsule by mouth daily.  . fesoterodine (TOVIAZ) 4 MG TB24 tablet Take 1 tablet (4 mg total) by mouth daily.  . finasteride (PROSCAR) 5 MG tablet Take 1 tablet (5 mg total) by mouth daily.  .  Multiple Vitamin (MULTI-VITAMINS) TABS Take by mouth.  . Omega-3 Fatty Acids (FISH OIL PO) Take by mouth.  Marland Kitchen omeprazole (PRILOSEC) 20 MG capsule TAKE 1 CAPSULE BY MOUTH ONCE DAILY  . simvastatin (ZOCOR) 20 MG tablet TAKE 1 TABLET BY MOUTH ONCE DAILY AT BEDTIME -  NEED  APPT  . tamsulosin (FLOMAX) 0.4 MG CAPS capsule Take 1 capsule (0.4 mg total) by mouth daily.  Marland Kitchen tiZANidine (ZANAFLEX) 2 MG tablet Take 1 tablet (2 mg total) by mouth at bedtime as needed for muscle spasms.   No facility-administered encounter medications on file as of 10/05/2019.     Review of Systems  Constitutional: Negative for appetite change and unexpected weight change.  HENT: Negative for congestion and sinus pressure.   Respiratory: Negative for cough, chest tightness and shortness of breath.   Cardiovascular: Negative for chest pain, palpitations and leg swelling.  Gastrointestinal: Negative for abdominal pain, diarrhea, nausea and vomiting.  Genitourinary: Negative for difficulty urinating and dysuria.  Musculoskeletal: Positive for neck pain. Negative for back pain.       Limited rom - neck.  More discomfort with looking to the left.    Skin: Negative for color change and rash.  Neurological: Negative for dizziness, light-headedness and headaches.  Psychiatric/Behavioral: Negative for agitation and dysphoric mood.       Objective:    Physical Exam Constitutional:      General: He is not in acute distress.    Appearance: Normal appearance. He is well-developed.  HENT:     Head: Normocephalic and atraumatic.     Right Ear: External ear normal.     Left Ear: External ear normal.  Eyes:     General: No scleral icterus.       Right eye: No discharge.        Left eye: No discharge.     Conjunctiva/sclera: Conjunctivae normal.  Neck:     Musculoskeletal: Neck supple.     Comments: Increased discomfort with rotation of head to left.  Limited rom.   Cardiovascular:     Rate and Rhythm: Normal rate and  regular rhythm.  Pulmonary:     Effort: Pulmonary effort is normal. No respiratory distress.     Breath sounds: Normal breath sounds.  Abdominal:     General: Bowel sounds  are normal.     Palpations: Abdomen is soft.     Tenderness: There is no abdominal tenderness.  Musculoskeletal:        General: No swelling or tenderness.  Lymphadenopathy:     Cervical: No cervical adenopathy.  Skin:    Findings: No erythema or rash.  Neurological:     Mental Status: He is alert.  Psychiatric:        Mood and Affect: Mood normal.        Behavior: Behavior normal.     BP 130/70   Pulse 63   Temp (!) 97.3 F (36.3 C)   Resp 16   Ht 6\' 1"  (1.854 m)   Wt 233 lb 12.8 oz (106.1 kg)   SpO2 97%   BMI 30.85 kg/m  Wt Readings from Last 3 Encounters:  10/05/19 233 lb 12.8 oz (106.1 kg)  08/30/19 237 lb (107.5 kg)  06/06/19 238 lb (108 kg)     Lab Results  Component Value Date   WBC 5.0 10/05/2019   HGB 13.9 10/05/2019   HCT 41.7 10/05/2019   PLT 181.0 10/05/2019   GLUCOSE 97 10/05/2019   CHOL 158 10/05/2019   TRIG 161.0 (H) 10/05/2019   HDL 50.40 10/05/2019   LDLCALC 75 10/05/2019   ALT 17 10/05/2019   AST 17 10/05/2019   NA 140 10/05/2019   K 4.1 10/05/2019   CL 105 10/05/2019   CREATININE 0.95 10/05/2019   BUN 14 10/05/2019   CO2 27 10/05/2019   TSH 2.60 10/05/2019   PSA 1.08 10/05/2019   MICROALBUR 0.7 10/22/2014    Mr Cervical Spine Wo Contrast  Result Date: 09/23/2019 CLINICAL DATA:  Chronic neck pain radiating to left shoulder EXAM: MRI CERVICAL SPINE WITHOUT CONTRAST TECHNIQUE: Multiplanar, multisequence MR imaging of the cervical spine was performed. No intravenous contrast was administered. COMPARISON:  None. FINDINGS: Alignment: Straightening of normal lordosis. Grade 1 retrolisthesis at C4-5. Vertebrae: Mild facet edema at left C2-3. No fracture. Cord: Normal signal and morphology. Posterior Fossa, vertebral arteries, paraspinal tissues: Negative. Disc levels:  C2-3: No disc herniation. Left-greater-than-right facet hypertrophy with severe left neural foraminal stenosis. C3-4: Small central disc protrusion effacing the ventral thecal sac. No spinal canal stenosis. Right-greater-than-left facet hypertrophy. Mild left foraminal stenosis. C4-5: Intermediate sized disc osteophyte complex with severe spinal canal stenosis and severe right foraminal stenosis. Moderate left neural foraminal stenosis. C5-6: Small disc bulge. No spinal canal or neural foraminal stenosis. C6-7: Small central disc protrusion with bilateral uncovertebral hypertrophy. Moderate spinal canal stenosis. No neural foraminal stenosis. C7-T1: Normal. Sagittal imaging of T1-2 and T2-3 is unremarkable. IMPRESSION: 1. Severe spinal canal stenosis and right neural foraminal stenosis at C4-5. 2. Severe left C2-3 neural foraminal stenosis. 3. Moderate spinal canal stenosis at C6-7. Electronically Signed   By: Ulyses Jarred M.D.   On: 09/23/2019 02:30       Assessment & Plan:   Problem List Items Addressed This Visit    Essential hypertension - Primary    Blood pressure under good control.  Continue same medication regimen.  Follow pressures.  Follow metabolic panel.        Relevant Orders   CBC with Differential/Platelet (Completed)   TSH (Completed)   Basic metabolic panel (Completed)   HLD (hyperlipidemia)   Relevant Orders   Hepatic function panel (Completed)   Lipid panel (Completed)   Left knee pain    Previously saw Vance Peper.  S/p injection - one year ago.  Some discomfort.  Not limiting his activity.  Does not feel needs referral back at this time.  Will notify me when feels he needs.        Neck pain    Had mri c-spine.  Spinal stenosis and foraminal stenosis as outlined.  Was given flexeril.  Neck is better.  Still some limited rom and discomfort, but is improved.  Has an appt with neurosurgery next week.  Will keep.        OSA on CPAP    Using cpap.  Doing well with this.         Prostate cancer screening    Check psa.       Relevant Orders   PSA, Medicare (Completed)    Other Visit Diagnoses    Need for immunization against influenza       Relevant Orders   Flu Vaccine QUAD High Dose(Fluad) (Completed)       [name], MD

## 2019-10-06 DIAGNOSIS — M25562 Pain in left knee: Secondary | ICD-10-CM | POA: Insufficient documentation

## 2019-10-06 NOTE — Assessment & Plan Note (Signed)
Using cpap.  Doing well with this.

## 2019-10-06 NOTE — Assessment & Plan Note (Signed)
Had mri c-spine.  Spinal stenosis and foraminal stenosis as outlined.  Was given flexeril.  Neck is better.  Still some limited rom and discomfort, but is improved.  Has an appt with neurosurgery next week.  Will keep.

## 2019-10-06 NOTE — Assessment & Plan Note (Signed)
Blood pressure under good control.  Continue same medication regimen.  Follow pressures.  Follow metabolic panel.   

## 2019-10-06 NOTE — Assessment & Plan Note (Signed)
On simvastatin.  Low cholesterol diet and exercise.  Follow lipid panel and liver function tests.   

## 2019-10-06 NOTE — Assessment & Plan Note (Signed)
Previously saw Vance Peper.  S/p injection - one year ago.  Some discomfort.  Not limiting his activity.  Does not feel needs referral back at this time.  Will notify me when feels he needs.

## 2019-10-06 NOTE — Assessment & Plan Note (Signed)
Check psa 

## 2019-10-09 DIAGNOSIS — M4802 Spinal stenosis, cervical region: Secondary | ICD-10-CM | POA: Diagnosis not present

## 2019-10-19 DIAGNOSIS — G4733 Obstructive sleep apnea (adult) (pediatric): Secondary | ICD-10-CM | POA: Diagnosis not present

## 2019-10-23 DIAGNOSIS — M25562 Pain in left knee: Secondary | ICD-10-CM | POA: Diagnosis not present

## 2019-10-23 DIAGNOSIS — M170 Bilateral primary osteoarthritis of knee: Secondary | ICD-10-CM | POA: Diagnosis not present

## 2019-10-23 DIAGNOSIS — G8929 Other chronic pain: Secondary | ICD-10-CM | POA: Diagnosis not present

## 2019-10-23 DIAGNOSIS — M25561 Pain in right knee: Secondary | ICD-10-CM | POA: Diagnosis not present

## 2019-11-21 ENCOUNTER — Other Ambulatory Visit: Payer: Self-pay | Admitting: Internal Medicine

## 2019-11-21 DIAGNOSIS — E785 Hyperlipidemia, unspecified: Secondary | ICD-10-CM

## 2019-12-06 ENCOUNTER — Telehealth: Payer: Self-pay | Admitting: Internal Medicine

## 2019-12-06 NOTE — Telephone Encounter (Signed)
Last ov note has been faxed to [facility] at provided fax number.  Amy with [facility] is aware and voiced her understanding.  Nothing further is needed.

## 2019-12-09 ENCOUNTER — Other Ambulatory Visit: Payer: Self-pay | Admitting: Internal Medicine

## 2019-12-09 DIAGNOSIS — R1310 Dysphagia, unspecified: Secondary | ICD-10-CM

## 2019-12-12 ENCOUNTER — Other Ambulatory Visit: Payer: Self-pay | Admitting: Internal Medicine

## 2020-01-10 ENCOUNTER — Other Ambulatory Visit: Payer: Self-pay

## 2020-01-10 ENCOUNTER — Ambulatory Visit (INDEPENDENT_AMBULATORY_CARE_PROVIDER_SITE_OTHER): Payer: PPO | Admitting: Internal Medicine

## 2020-01-10 VITALS — BP 160/78 | HR 71 | Temp 96.5°F | Resp 16 | Ht 72.0 in | Wt 239.0 lb

## 2020-01-10 DIAGNOSIS — E785 Hyperlipidemia, unspecified: Secondary | ICD-10-CM | POA: Diagnosis not present

## 2020-01-10 DIAGNOSIS — I10 Essential (primary) hypertension: Secondary | ICD-10-CM

## 2020-01-10 DIAGNOSIS — M25562 Pain in left knee: Secondary | ICD-10-CM

## 2020-01-10 DIAGNOSIS — Z9989 Dependence on other enabling machines and devices: Secondary | ICD-10-CM

## 2020-01-10 DIAGNOSIS — G4733 Obstructive sleep apnea (adult) (pediatric): Secondary | ICD-10-CM

## 2020-01-10 DIAGNOSIS — Z0000 Encounter for general adult medical examination without abnormal findings: Secondary | ICD-10-CM

## 2020-01-10 DIAGNOSIS — M542 Cervicalgia: Secondary | ICD-10-CM

## 2020-01-10 LAB — HEPATIC FUNCTION PANEL
ALT: 22 U/L (ref 0–53)
AST: 22 U/L (ref 0–37)
Albumin: 4.4 g/dL (ref 3.5–5.2)
Alkaline Phosphatase: 74 U/L (ref 39–117)
Bilirubin, Direct: 0.1 mg/dL (ref 0.0–0.3)
Total Bilirubin: 0.7 mg/dL (ref 0.2–1.2)
Total Protein: 6.7 g/dL (ref 6.0–8.3)

## 2020-01-10 LAB — BASIC METABOLIC PANEL
BUN: 14 mg/dL (ref 6–23)
CO2: 23 mEq/L (ref 19–32)
Calcium: 9.1 mg/dL (ref 8.4–10.5)
Chloride: 106 mEq/L (ref 96–112)
Creatinine, Ser: 1 mg/dL (ref 0.40–1.50)
GFR: 71.57 mL/min (ref >60.00)
Glucose, Bld: 99 mg/dL (ref 70–99)
Potassium: 4.5 mEq/L (ref 3.5–5.1)
Sodium: 139 mEq/L (ref 135–145)

## 2020-01-10 LAB — LIPID PANEL
Cholesterol: 171 mg/dL (ref 0–200)
HDL: 54.4 mg/dL (ref >39.00)
LDL Cholesterol: 85 mg/dL (ref 0–99)
NonHDL: 116.77
Total CHOL/HDL Ratio: 3
Triglycerides: 157 mg/dL — ABNORMAL HIGH (ref 0.0–149.0)
VLDL: 31.4 mg/dL (ref 0.0–40.0)

## 2020-01-10 NOTE — Progress Notes (Addendum)
Patient ID: Brian Pearson, male   DOB: Jul 16, 1936, 84 y.o.   MRN: [id number]   Subjective:    Patient ID: Brian Pearson, male    DOB: 12/13/1936, 84 y.o.   MRN: [id number]  HPI   This visit occurred during the SARS-CoV-2 public health emergency.  Safety protocols were in place, including screening questions prior to the visit, additional usage of staff PPE, and extensive cleaning of exam room while observing appropriate contact time as indicated for disinfecting solutions.  Patient here for his physical exam.  He is doing well.  Trying to stay active.  No chest pain or sob.  No acid reflux reported.  No abdominal pain or bowel change.  Neck is better.  Saw neurosurgery.  Following.  Recently saw ortho.  Left knee pain.  S/p injection.  No pain now.  Doing well.  Using cpap regularly.  Overall he feels he is doing well.  Blood pressure elevated initially.  Recheck improved - [id number] systolic.     Past Medical History:  Diagnosis Date  . Arthritis   . BPH with obstruction/lower urinary tract symptoms   . GERD (gastroesophageal reflux disease)   . Hyperlipidemia    taken off simvastatin last year  . Hypertension   . Obesity   . Pneumonia    hospitalized   . Urinary frequency   . Urine incontinence    Past Surgical History:  Procedure Laterality Date  . COLONOSCOPY WITH PROPOFOL N/A 02/09/2016   Procedure: COLONOSCOPY WITH PROPOFOL;  Surgeon: Hulen Luster, MD;  Location: [facility] ENDOSCOPY;  Service: Gastroenterology;  Laterality: N/A;  . ESOPHAGOGASTRODUODENOSCOPY (EGD) WITH PROPOFOL N/A 02/09/2016   Procedure: ESOPHAGOGASTRODUODENOSCOPY (EGD) WITH PROPOFOL;  Surgeon: Hulen Luster, MD;  Location: [facility] ENDOSCOPY;  Service: Gastroenterology;  Laterality: N/A;  . HERNIA REPAIR     1990's - 3 surgeries   Family History  Problem Relation Age of Onset  . Arthritis Mother   . Heart disease Mother        CHF  . Cancer Father        Prostate  . Dementia Father   . Alzheimer's disease Father   .  Hypotension Sister   . Kidney cancer Neg Hx   . Bladder Cancer Neg Hx    Social History   Socioeconomic History  . Marital status: Married    Spouse name: Not on file  . Number of children: Not on file  . Years of education: Not on file  . Highest education level: Not on file  Occupational History  . Not on file  Tobacco Use  . Smoking status: Never Smoker  . Smokeless tobacco: Never Used  Substance and Sexual Activity  . Alcohol use: Yes    Alcohol/week: 0.0 standard drinks    Comment: OCC glass of wine  . Drug use: No  . Sexual activity: Yes  Other Topics Concern  . Not on file  Social History Narrative   Lives in [place].      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 Difficulty of Paying Living Expenses: Not on [place]:   . Worried About Charity fundraiser in the Last  Year: Not on file  . Ran Out of Food in the Last Year: Not on file  Transportation Needs:   . Lack of Transportation (Medical): Not on file  . Lack of Transportation (Non-Medical): Not on file  Physical Activity:   . Days of Exercise per Week: Not on file  . Minutes of Exercise per Session: Not on file  Stress:   . Feeling of Stress : Not on file  Social Connections:   . Frequency of Communication with Friends and Family: Not on file  . Frequency of Social Gatherings with Friends and Family: Not on file  . Attends Religious Services: Not on file  . Active Member of Clubs or Organizations: Not on file  . Attends Archivist Meetings: Not on file  . Marital Status: Not on file    Outpatient Encounter Medications as of 01/10/2020  Medication Sig  . amLODipine (NORVASC) 2.5 MG tablet Take 1 tablet by mouth once daily  . aspirin EC 81 MG tablet Take by mouth.  .  Cholecalciferol (VITAMIN D-3) 1000 units CAPS Take 1 capsule by mouth.  . Coenzyme Q10 (COQ10 PO) Take 1 capsule by mouth daily.  . fesoterodine (TOVIAZ) 4 MG TB24 tablet Take 1 tablet (4 mg total) by mouth daily.  . finasteride (PROSCAR) 5 MG tablet Take 1 tablet (5 mg total) by mouth daily.  . Multiple Vitamin (MULTI-VITAMINS) TABS Take by mouth.  . Omega-3 Fatty Acids (FISH OIL PO) Take by mouth.  Marland Kitchen omeprazole (PRILOSEC) 20 MG capsule Take 1 capsule by mouth once daily  . simvastatin (ZOCOR) 20 MG tablet TAKE 1 TABLET BY MOUTH AT BEDTIME. NEED APPT.  . tamsulosin (FLOMAX) 0.4 MG CAPS capsule Take 1 capsule (0.4 mg total) by mouth daily.  . [DISCONTINUED] tiZANidine (ZANAFLEX) 2 MG tablet Take 1 tablet (2 mg total) by mouth at bedtime as needed for muscle spasms.   No facility-administered encounter medications on file as of 01/10/2020.    Review of Systems  Constitutional: Negative for appetite change and unexpected weight change.  HENT: Negative for congestion and sinus pressure.   Eyes: Negative for pain and visual disturbance.  Respiratory: Negative for cough, chest tightness and shortness of breath.   Cardiovascular: Negative for chest pain, palpitations and leg swelling.  Gastrointestinal: Negative for abdominal pain, diarrhea, nausea and vomiting.  Genitourinary: Negative for difficulty urinating and dysuria.  Musculoskeletal: Negative for joint swelling and myalgias.       Neck pain is better. Knee better s/p injection.    Skin: Negative for color change and rash.  Neurological: Negative for dizziness, light-headedness and headaches.  Hematological: Negative for adenopathy. Does not bruise/bleed easily.  Psychiatric/Behavioral: Negative for agitation and dysphoric mood.       Objective:    Physical Exam Constitutional:      General: He is not in acute distress.    Appearance: Normal appearance. He is well-developed.  HENT:     Head: Normocephalic and atraumatic.      Right Ear: External ear normal.     Left Ear: External ear normal.  Eyes:     General:        Right eye: No discharge.        Left eye: No discharge.     Conjunctiva/sclera: Conjunctivae normal.  Neck:     Thyroid: No thyromegaly.  Cardiovascular:     Rate and Rhythm: Normal rate and regular rhythm.  Pulmonary:     Effort: No respiratory distress.  Breath sounds: Normal breath sounds. No wheezing.  Abdominal:     General: Bowel sounds are normal.     Palpations: Abdomen is soft.     Tenderness: There is no abdominal tenderness.  Genitourinary:    Comments: Not performed.   Musculoskeletal:        General: No tenderness.     Cervical back: Neck supple. No tenderness.  Lymphadenopathy:     Cervical: No cervical adenopathy.  Skin:    Findings: No erythema or rash.  Neurological:     Mental Status: He is alert and oriented to person, place, and time.  Psychiatric:        Mood and Affect: Mood normal.        Behavior: Behavior normal.     BP (!) 160/78   Pulse 71   Temp (!) 96.5 F (35.8 C)   Resp 16   Ht 6' (1.829 m)   Wt 239 lb (108.4 kg)   SpO2 97%   BMI 32.41 kg/m  Wt Readings from Last 3 Encounters:  01/10/20 239 lb (108.4 kg)  10/05/19 233 lb 12.8 oz (106.1 kg)  08/30/19 237 lb (107.5 kg)     Lab Results  Component Value Date   WBC 5.0 10/05/2019   HGB 13.9 10/05/2019   HCT 41.7 10/05/2019   PLT 181.0 10/05/2019   GLUCOSE 99 01/10/2020   CHOL 171 01/10/2020   TRIG 157.0 (H) 01/10/2020   HDL 54.40 01/10/2020   LDLCALC 85 01/10/2020   ALT 22 01/10/2020   AST 22 01/10/2020   NA 139 01/10/2020   K 4.5 01/10/2020   CL 106 01/10/2020   CREATININE 1.00 01/10/2020   BUN 14 01/10/2020   CO2 23 01/10/2020   TSH 2.60 10/05/2019   PSA 1.08 10/05/2019   MICROALBUR 0.7 10/22/2014    MR Cervical Spine Wo Contrast  Result Date: 09/23/2019 CLINICAL DATA:  Chronic neck pain radiating to left shoulder EXAM: MRI CERVICAL SPINE WITHOUT CONTRAST TECHNIQUE:  Multiplanar, multisequence MR imaging of the cervical spine was performed. No intravenous contrast was administered. COMPARISON:  None. FINDINGS: Alignment: Straightening of normal lordosis. Grade 1 retrolisthesis at C4-5. Vertebrae: Mild facet edema at left C2-3. No fracture. Cord: Normal signal and morphology. Posterior Fossa, vertebral arteries, paraspinal tissues: Negative. Disc levels: C2-3: No disc herniation. Left-greater-than-right facet hypertrophy with severe left neural foraminal stenosis. C3-4: Small central disc protrusion effacing the ventral thecal sac. No spinal canal stenosis. Right-greater-than-left facet hypertrophy. Mild left foraminal stenosis. C4-5: Intermediate sized disc osteophyte complex with severe spinal canal stenosis and severe right foraminal stenosis. Moderate left neural foraminal stenosis. C5-6: Small disc bulge. No spinal canal or neural foraminal stenosis. C6-7: Small central disc protrusion with bilateral uncovertebral hypertrophy. Moderate spinal canal stenosis. No neural foraminal stenosis. C7-T1: Normal. Sagittal imaging of T1-2 and T2-3 is unremarkable. IMPRESSION: 1. Severe spinal canal stenosis and right neural foraminal stenosis at C4-5. 2. Severe left C2-3 neural foraminal stenosis. 3. Moderate spinal canal stenosis at C6-7. Electronically Signed   By: Ulyses Jarred M.D.   On: 09/23/2019 02:30       Assessment & Plan:   Problem List Items Addressed This Visit    Essential hypertension - Primary    Blood pressure elevated initially.  Recheck improved, but still slightly increased.  Have him spot check his pressure.  Send in readings.  Continue current medication regimen.  Follow pressures.  Follow metabolic panel.       Relevant Orders   Basic  metabolic panel (Completed)   Health care maintenance    Physical today 01/10/20.  Colonoscopy 01/2016 - normal.  PSA 1.08 - 10/05/19.        HLD (hyperlipidemia)   Relevant Orders   Hepatic function panel  (Completed)   Lipid panel (Completed)   Left knee pain    S/p injection.  Knee ok.  Follow.        Neck pain    Had MRI c-spine.  Spinal stenosis and foraminal stenosis.  Was treated with flexeril.  Neck is better.  Saw neurosurgery.  Neck better.  NSU following.  No problems now.  Follow.        OSA on CPAP    Using cpap.  Continue.           [name], MD

## 2020-01-10 NOTE — Assessment & Plan Note (Signed)
Physical today 01/10/20.  Colonoscopy 01/2016 - normal.  PSA 1.08 - 10/05/19.

## 2020-01-12 ENCOUNTER — Telehealth: Payer: Self-pay | Admitting: Internal Medicine

## 2020-01-12 ENCOUNTER — Encounter: Payer: Self-pay | Admitting: Internal Medicine

## 2020-01-12 NOTE — Assessment & Plan Note (Signed)
S/p injection.  Knee ok.  Follow.

## 2020-01-12 NOTE — Assessment & Plan Note (Signed)
Blood pressure elevated initially.  Recheck improved, but still slightly increased.  Have him spot check his pressure.  Send in readings.  Continue current medication regimen.  Follow pressures.  Follow metabolic panel.

## 2020-01-12 NOTE — Assessment & Plan Note (Signed)
Had MRI c-spine.  Spinal stenosis and foraminal stenosis.  Was treated with flexeril.  Neck is better.  Saw neurosurgery.  Neck better.  NSU following.  No problems now.  Follow.

## 2020-01-12 NOTE — Telephone Encounter (Signed)
Please call pt and see how his blood pressure is doing.  Was elevated in office.  If has not checked, see if he can spot check and send in readings.

## 2020-01-12 NOTE — Assessment & Plan Note (Signed)
On simvastatin.  Low cholesterol diet and exercise.  Follow lipid panel and liver function tests.   

## 2020-01-12 NOTE — Assessment & Plan Note (Signed)
Using cpap.  Continue.

## 2020-01-14 MED ORDER — AMLODIPINE BESYLATE 5 MG PO TABS: 2.5000 mg | ORAL_TABLET | Freq: Every day | ORAL | 1 refills | Status: DC

## 2020-01-14 NOTE — Telephone Encounter (Signed)
Please notify to continue on 5mg  amlodipine and continue to monitor blood pressure.  Send in readings.    Dr Nicki Reaper

## 2020-01-14 NOTE — Telephone Encounter (Addendum)
I am sorry typing to fast the BP was 125/65, script sent.

## 2020-01-14 NOTE — Telephone Encounter (Signed)
Please confirm blood pressure reading.  What was his blood pressure?  Ok to send in amlodipine 5mg  q day.  (confirm he is taking 2 (2.5mg ) tablets now.

## 2020-01-14 NOTE — Addendum Note (Signed)
Addended by: Nanci Pina on: 01/14/2020 04:20 PM   Modules accepted: Orders

## 2020-01-14 NOTE — Telephone Encounter (Signed)
Patient said he has doubled the Amlodipine as advised at visit (did not see this in note) but since increase BP yesterday 12/65 , need new script if PCP thinks he should stay Amlodipine 5 mg aily.

## 2020-01-15 NOTE — Telephone Encounter (Signed)
Patient stated he will continue to monitor for next week and send readings on Monday. Reading today 126/72 and feels fine.

## 2020-01-16 DIAGNOSIS — G4733 Obstructive sleep apnea (adult) (pediatric): Secondary | ICD-10-CM | POA: Diagnosis not present

## 2020-01-18 ENCOUNTER — Telehealth: Payer: Self-pay | Admitting: Internal Medicine

## 2020-01-18 ENCOUNTER — Telehealth: Payer: Self-pay | Admitting: Lab

## 2020-01-18 NOTE — Telephone Encounter (Signed)
Called Pt back to clarify Rx dosage.  Pt stated bottle says take 1/2 tablet.Pt stated he was told by Dr.Scott to take 1 tablet 5mg  q daily. I looked at Dr. Lars Mage last visit note, and it state take 1 tablet 5mg  q daily. Pt stated he understood with No more questions.

## 2020-01-18 NOTE — Telephone Encounter (Signed)
Pt needs clarification about medication amLODipine (NORVASC) 5 MG tablet . Please call back

## 2020-01-21 NOTE — Telephone Encounter (Signed)
Notify pt that I reviewed his blood pressures.  Some variation.  Continue to spot check.  We will follow.  If remains in 140s, will adjust medication further.

## 2020-01-21 NOTE — Telephone Encounter (Signed)
Pt's wife called and said you needed blood pressure reading for patient:  Last Tuesday 01/15/20 128/58  Wed 01/16/20 135/63  Thurs 01/17/20 137/65  Friday 01/18/20 141/73  Sat 01/19/20 147/60  Sun 01/20/20 147/63  Monday 01/21/20 143/71

## 2020-01-22 NOTE — Telephone Encounter (Signed)
Patient notified and voiced understanding, will call back with readings next week.

## 2020-01-29 ENCOUNTER — Telehealth: Payer: Self-pay | Admitting: Internal Medicine

## 2020-01-29 NOTE — Telephone Encounter (Signed)
Pt's wife called to give pt's bp readings  01/23/20 am 137/73 01/24/20 am 138/64 ---pm 140/65 01/25/20 am 128/66 ---pm 124/61 01/26/20 am 145/68 ---pm 138/72 01/27/20 am 149/64 ---pm 148/73 01/28/20 am 140/60 ---pm 146/82 01/29/20 am 140/73

## 2020-01-30 ENCOUNTER — Other Ambulatory Visit: Payer: Self-pay

## 2020-01-30 MED ORDER — AMLODIPINE BESYLATE 10 MG PO TABS: 10.0000 mg | ORAL_TABLET | Freq: Every day | ORAL | 0 refills | Status: DC

## 2020-01-30 NOTE — Telephone Encounter (Signed)
Pt advised of below. New rx sent in. Pt scheduled for appt.

## 2020-01-30 NOTE — Telephone Encounter (Signed)
Reviewed blood pressures.  Blood pressures elevated.  He is currently taking amlodipine 5mg  one per day.  Given persistent increase, I would like to increase amlodipine to 10mg  q day.  Will need new rx sent in with correct directions.  (I think his bottle still states 1/2 (5mg ) tablet q day, but we increased this to 5mg  q day and now I want to increase to 10mg  q day.  Spot check pressures and send in readings.  Also, schedule f/u appt in 4 weeks.

## 2020-02-05 IMAGING — MR MR CERVICAL SPINE W/O CM
5 series · 40 of 48 positions shown · non-contrast
Comparison: None.

CLINICAL DATA: Chronic neck pain radiating to left shoulder

EXAM:
MRI CERVICAL SPINE WITHOUT CONTRAST
TECHNIQUE: Multiplanar, multisequence MR imaging of the cervical spine was
performed. No intravenous contrast was administered.

[Series 5: T2 · sagittal · 3.0mm · 0.62mm/px · 6 of 15 slices shown (1 of 2)]
[im 1/15]
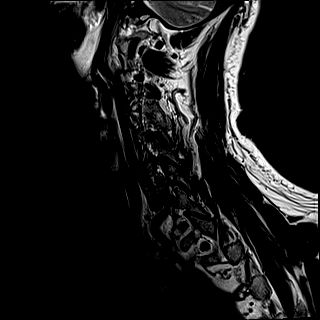
[im 3/15]
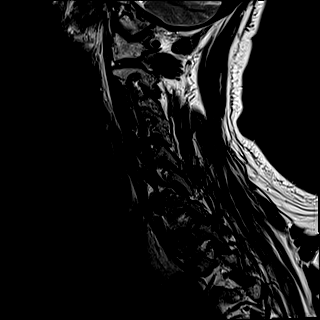
[im 6/15]
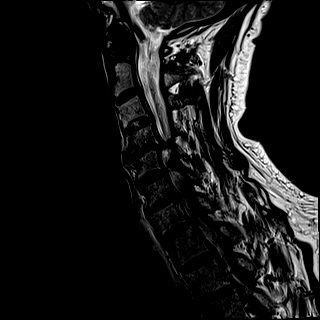
[im 9/15]
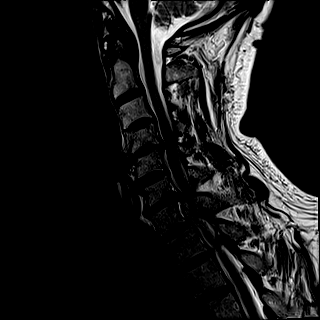
[im 12/15]
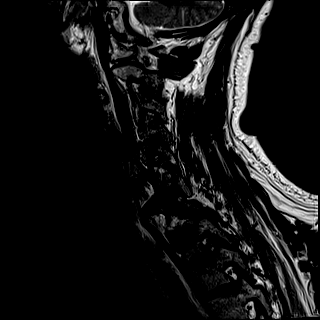
[im 15/15]
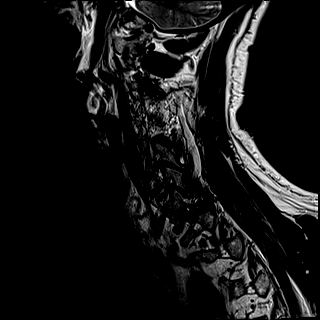

[Series 6: FLAIR · sagittal · 3.0mm · 0.78mm/px · 7 of 15 slices shown]
[im 1/15]
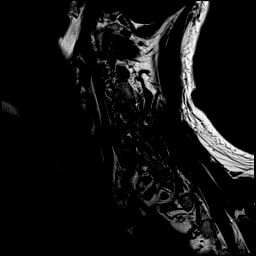
[im 3/15]
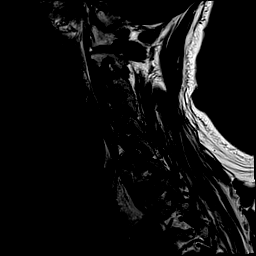
[im 5/15]
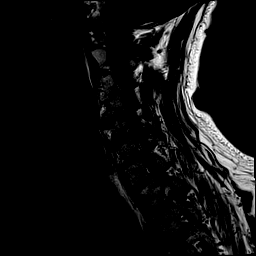
[im 8/15]
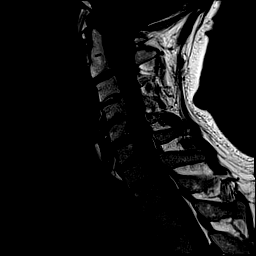
[im 10/15]
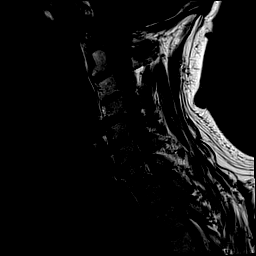
[im 12/15]
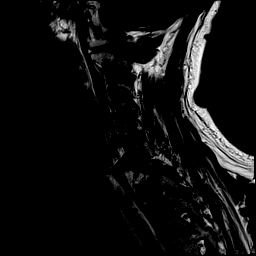
[im 15/15]
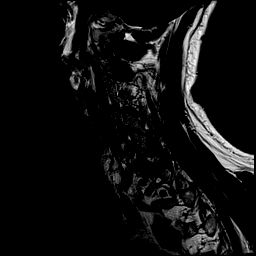

[Series 7: STIR · sagittal · 3.0mm · 0.62mm/px · 7 of 15 slices shown]
[im 1/15]
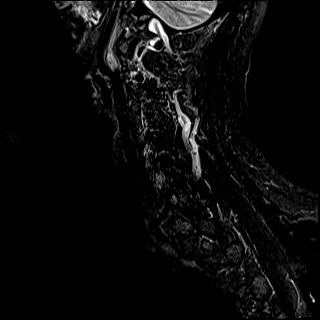
[im 3/15]
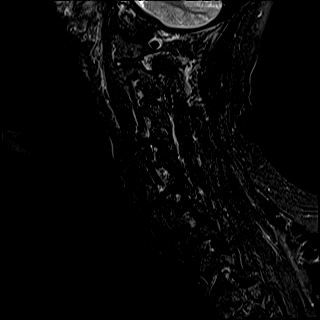
[im 5/15]
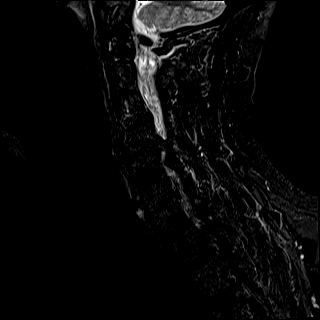
[im 8/15]
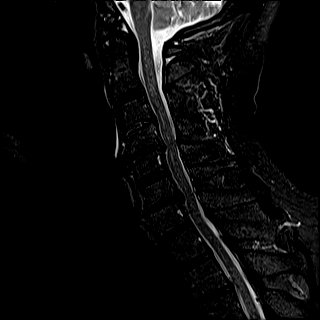
[im 10/15]
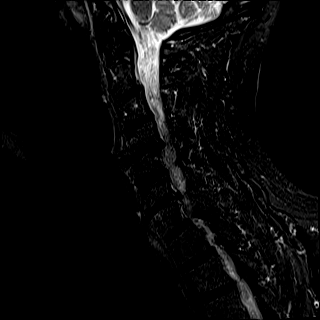
[im 12/15]
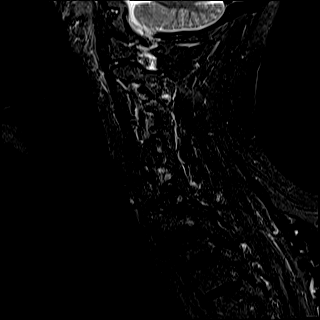
[im 15/15]
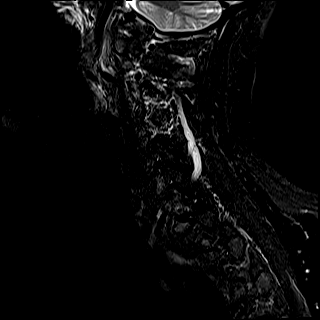

[Series 8: T2 · axial · 3.0mm · 0.70mm/px · z∈[-134,-32]mm · 12 of 31 slices shown (2 of 2)]
[im 1/31]
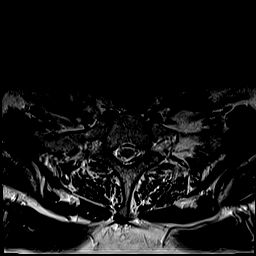
[im 3/31]
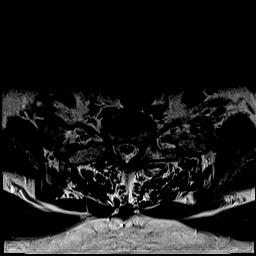
[im 5/31]
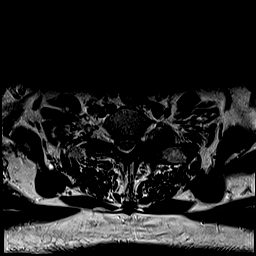
[im 7/31]
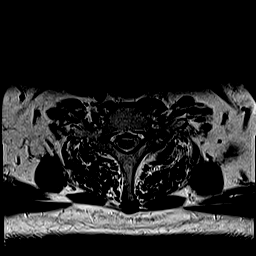
[im 10/31]
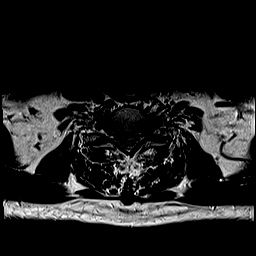
[im 12/31]
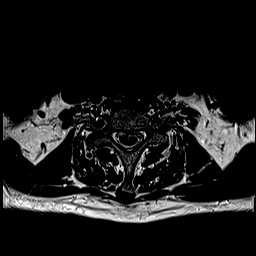
[im 14/31]
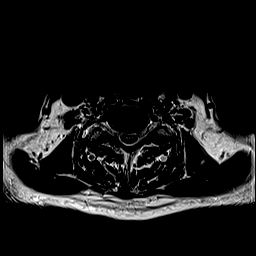
[im 17/31]
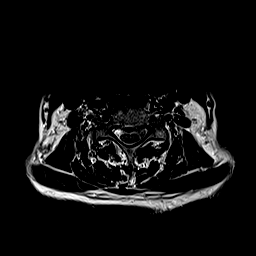
[im 19/31]
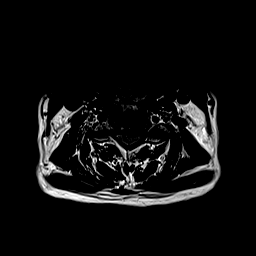
[im 21/31]
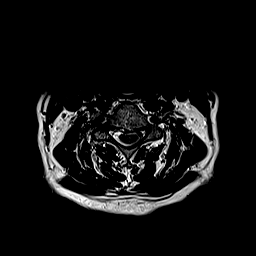
[im 26/31]
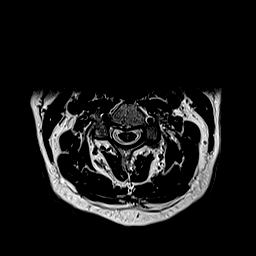
[im 31/31]
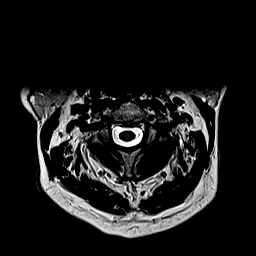

[Series 9: ax mpgr · axial · 3.0mm · 0.35mm/px · z∈[-134,-32]mm · 8 of 31 slices shown]
[im 1/31]
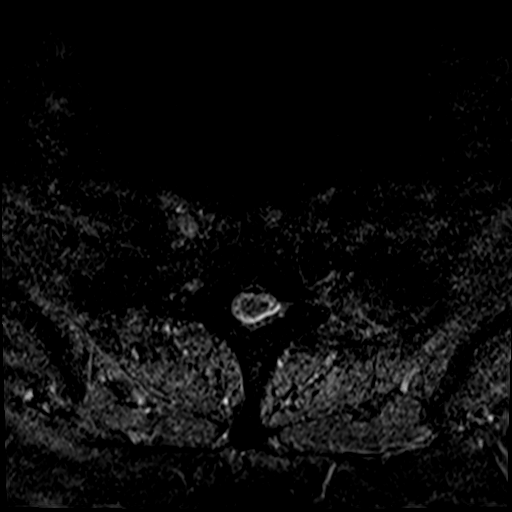
[im 5/31]
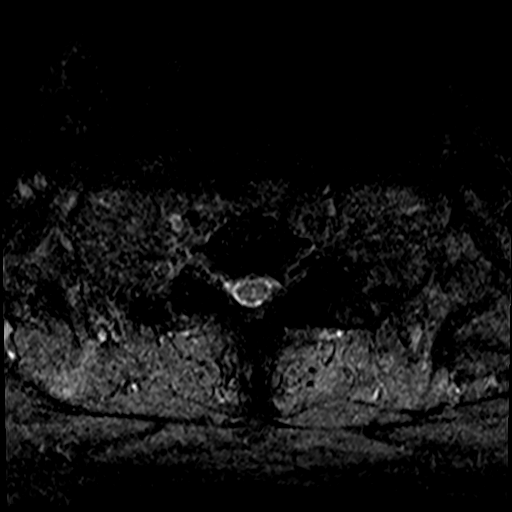
[im 10/31]
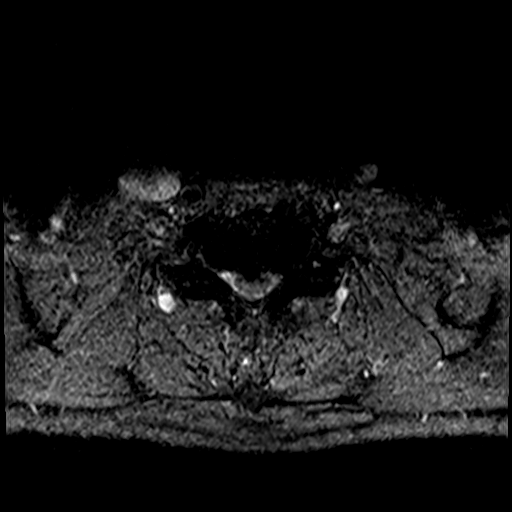
[im 14/31]
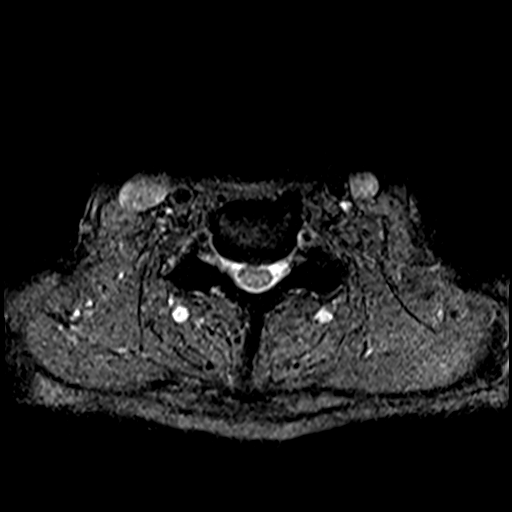
[im 17/31]
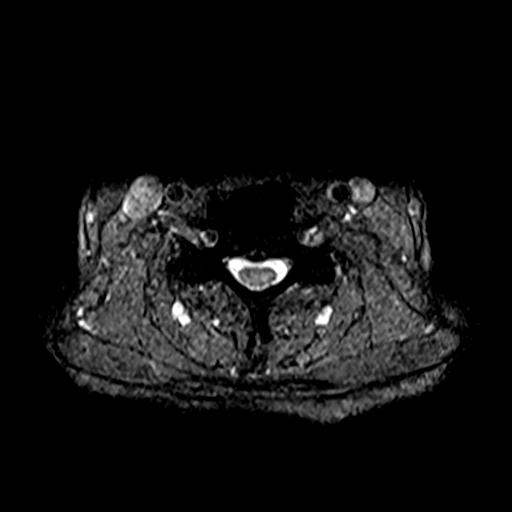
[im 21/31]
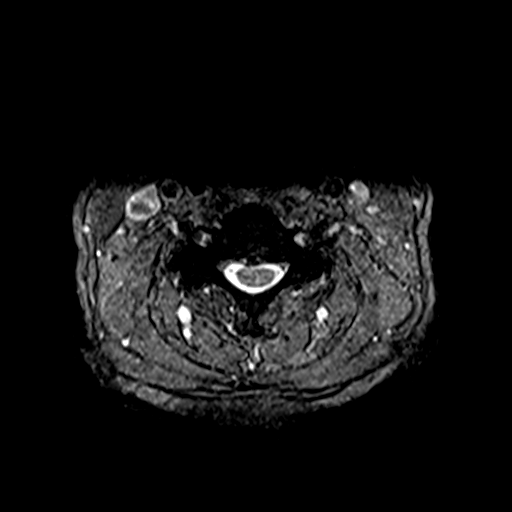
[im 26/31]
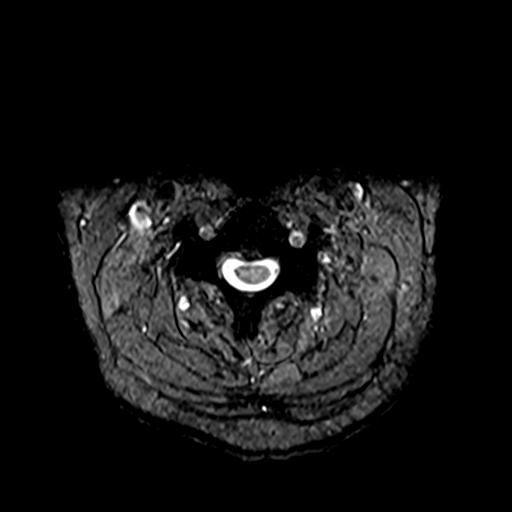
[im 31/31]
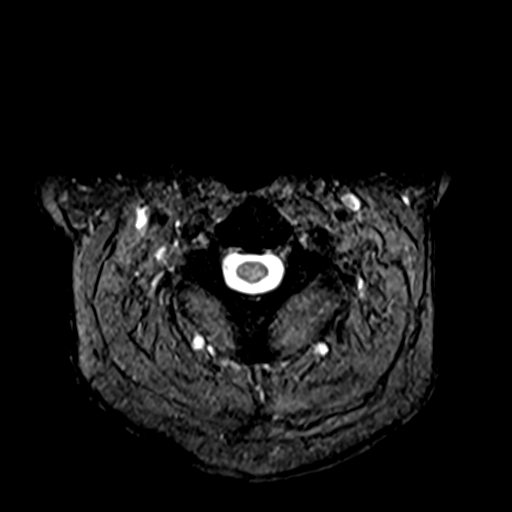

[40 of 48 positions shown; findings below may reference images not displayed]

FINDINGS: Alignment: Straightening of normal lordosis. Grade 1 retrolisthesis
at C4-5.

Vertebrae: Mild facet edema at left C2-3. No fracture.

Cord: Normal signal and morphology.

Posterior Fossa, vertebral arteries, paraspinal tissues: Negative.

Disc levels:

C2-3: No disc herniation. Left-greater-than-right facet hypertrophy
with severe left neural foraminal stenosis.

C3-4: Small central disc protrusion effacing the ventral thecal sac.
No spinal canal stenosis. Right-greater-than-left facet hypertrophy.
Mild left foraminal stenosis.

C4-5: Intermediate sized disc osteophyte complex with severe spinal
canal stenosis and severe right foraminal stenosis. Moderate left
neural foraminal stenosis.

C5-6: Small disc bulge. No spinal canal or neural foraminal
stenosis.

C6-7: Small central disc protrusion with bilateral uncovertebral
hypertrophy. Moderate spinal canal stenosis. No neural foraminal
stenosis.

C7-T1: Normal.

Sagittal imaging of T1-2 and T2-3 is unremarkable.
IMPRESSION: 1. Severe spinal canal stenosis and right neural foraminal stenosis
at C4-5.
2. Severe left C2-3 neural foraminal stenosis.
3. Moderate spinal canal stenosis at C6-7.

## 2020-02-14 DIAGNOSIS — D2261 Melanocytic nevi of right upper limb, including shoulder: Secondary | ICD-10-CM | POA: Diagnosis not present

## 2020-02-14 DIAGNOSIS — X32XXXA Exposure to sunlight, initial encounter: Secondary | ICD-10-CM | POA: Diagnosis not present

## 2020-02-14 DIAGNOSIS — D2271 Melanocytic nevi of right lower limb, including hip: Secondary | ICD-10-CM | POA: Diagnosis not present

## 2020-02-14 DIAGNOSIS — L821 Other seborrheic keratosis: Secondary | ICD-10-CM | POA: Diagnosis not present

## 2020-02-14 DIAGNOSIS — D2262 Melanocytic nevi of left upper limb, including shoulder: Secondary | ICD-10-CM | POA: Diagnosis not present

## 2020-02-14 DIAGNOSIS — D225 Melanocytic nevi of trunk: Secondary | ICD-10-CM | POA: Diagnosis not present

## 2020-02-14 DIAGNOSIS — L570 Actinic keratosis: Secondary | ICD-10-CM | POA: Diagnosis not present

## 2020-02-14 DIAGNOSIS — D2272 Melanocytic nevi of left lower limb, including hip: Secondary | ICD-10-CM | POA: Diagnosis not present

## 2020-02-27 ENCOUNTER — Telehealth (INDEPENDENT_AMBULATORY_CARE_PROVIDER_SITE_OTHER): Payer: PPO | Admitting: Internal Medicine

## 2020-02-27 ENCOUNTER — Other Ambulatory Visit: Payer: Self-pay

## 2020-02-27 DIAGNOSIS — I10 Essential (primary) hypertension: Secondary | ICD-10-CM | POA: Diagnosis not present

## 2020-02-27 DIAGNOSIS — E785 Hyperlipidemia, unspecified: Secondary | ICD-10-CM | POA: Diagnosis not present

## 2020-02-27 DIAGNOSIS — M25561 Pain in right knee: Secondary | ICD-10-CM

## 2020-02-27 DIAGNOSIS — Z9989 Dependence on other enabling machines and devices: Secondary | ICD-10-CM | POA: Diagnosis not present

## 2020-02-27 DIAGNOSIS — M542 Cervicalgia: Secondary | ICD-10-CM

## 2020-02-27 DIAGNOSIS — G4733 Obstructive sleep apnea (adult) (pediatric): Secondary | ICD-10-CM

## 2020-02-27 NOTE — Progress Notes (Signed)
Patient ID: Brian Pearson, male   DOB: [birth year], 84 y.o.   MRN: [id number]   Virtual Visit via vdieo Note  This visit type was conducted due to national recommendations for restrictions regarding the COVID-19 pandemic (e.g. social distancing).  This format is felt to be most appropriate for this patient at this time.  All issues noted in this document were discussed and addressed.  No physical exam was performed (except for noted visual exam findings with Video Visits).   I connected with Brian Pearson today by a video enabled telemedicine application and verified that I am speaking with the correct person using two identifiers. Location patient: home Location provider: work Persons participating in the virtual visit: patient, provider  I discussed the limitations, risks, security and privacy concerns of performing an evaluation and management service by video and the availability of in person appointments The patient expressed understanding and agreed to proceed.  We attempted connection with video.  He could hear and see me, but I could not see him.  I could hear him.  After multiple attempts, it was decided to continue with the above.  (with me only being able to hear him).     Reason for visit: scheduled follow up.    HPI: He reports he is doing relatively well.  Has had issues with knee pain. Previously saw ortho and received injection left knee.  Better.  Now having right side knee pain.  Persistent pain - right knee. Discussed referral back to ortho.  He has had problems with his elevated blood pressure.  Recently started amlodipine and dose increased to 10mg  q day.  No chest pain or sob.  Some occasional acid reflux.  Overall feels acid reflux controlled.  No abdominal pain.  Bowels moving.  No headache or dizziness.  Blood pressures doing better.  Blood pressures averaging 130s/60-70s.  Doing well with cpap.     ROS: See pertinent positives and negatives per HPI.  Past Medical  History:  Diagnosis Date  . Arthritis   . BPH with obstruction/lower urinary tract symptoms   . GERD (gastroesophageal reflux disease)   . Hyperlipidemia    taken off simvastatin last year  . Hypertension   . Obesity   . Pneumonia    hospitalized   . Urinary frequency   . Urine incontinence     Past Surgical History:  Procedure Laterality Date  . COLONOSCOPY WITH PROPOFOL N/A 02/09/2016   Procedure: COLONOSCOPY WITH PROPOFOL;  Surgeon: Hulen Luster, MD;  Location: [facility] ENDOSCOPY;  Service: Gastroenterology;  Laterality: N/A;  . ESOPHAGOGASTRODUODENOSCOPY (EGD) WITH PROPOFOL N/A 02/09/2016   Procedure: ESOPHAGOGASTRODUODENOSCOPY (EGD) WITH PROPOFOL;  Surgeon: Hulen Luster, MD;  Location: [facility] ENDOSCOPY;  Service: Gastroenterology;  Laterality: N/A;  . HERNIA REPAIR     1990's - 3 surgeries    Family History  Problem Relation Age of Onset  . Arthritis Mother   . Heart disease Mother        CHF  . Cancer Father        Prostate  . Dementia Father   . Alzheimer's disease Father   . Hypotension Sister   . Kidney cancer Neg Hx   . Bladder Cancer Neg Hx     SOCIAL HX: reviewed.    Current Outpatient Medications:  .  amLODipine (NORVASC) 10 MG tablet, Take 1 tablet (10 mg total) by mouth daily., Disp: 90 tablet, Rfl: 0 .  aspirin EC 81 MG tablet, Take by mouth., Disp: ,  Rfl:  .  Cholecalciferol (VITAMIN D-3) 1000 units CAPS, Take 1 capsule by mouth., Disp: , Rfl:  .  Coenzyme Q10 (COQ10 PO), Take 1 capsule by mouth daily., Disp: , Rfl:  .  fesoterodine (TOVIAZ) 4 MG TB24 tablet, Take 1 tablet (4 mg total) by mouth daily., Disp: 90 tablet, Rfl: 3 .  finasteride (PROSCAR) 5 MG tablet, Take 1 tablet (5 mg total) by mouth daily., Disp: 90 tablet, Rfl: 3 .  Multiple Vitamin (MULTI-VITAMINS) TABS, Take by mouth., Disp: , Rfl:  .  Omega-3 Fatty Acids (FISH OIL PO), Take by mouth., Disp: , Rfl:  .  omeprazole (PRILOSEC) 20 MG capsule, Take 1 capsule by mouth once daily, Disp: 90 capsule,  Rfl: 0 .  simvastatin (ZOCOR) 20 MG tablet, TAKE 1 TABLET BY MOUTH AT BEDTIME. NEED APPT., Disp: 90 tablet, Rfl: 3 .  tamsulosin (FLOMAX) 0.4 MG CAPS capsule, Take 1 capsule (0.4 mg total) by mouth daily., Disp: 90 capsule, Rfl: 3  EXAM:  VITALS per patient if applicable:  [id number]  GENERAL: alert.  Sounds to be in no acute distress.  Answering questions appropriately.    PSYCH/NEURO: pleasant and cooperative, no obvious depression or anxiety, speech and thought processing grossly intact  ASSESSMENT AND PLAN:  Discussed the following assessment and plan:  Essential hypertension Blood pressure better on current regimen.  Continue norvasc.  Follow pressures.  Follow metabolic panel.   Hyperlipidemia On simvastatin.  Low cholesterol diet and exercise.  Follow lipid panel and liver function tests.    Neck pain Had mri c-spine. Spinal stenosis and foraminal stenosis.  Saw NSU.  Currently doing better.  Pain better.  Follow.   OSA on CPAP Doing well. Using regularly.  Sleeping better.  Follow.    Right knee pain Increased pain right knee now.  Request referral back to ortho.     Orders Placed This Encounter  Procedures  . Hepatic function panel    Standing Status:   Future    Standing Expiration Date:   03/02/2021  . Lipid panel    Standing Status:   Future    Standing Expiration Date:   03/02/2021  . Basic metabolic panel    Standing Status:   Future    Standing Expiration Date:   03/02/2021  . Ambulatory referral to Orthopedic Surgery    Referral Priority:   Routine    Referral Type:   Surgical    Referral Reason:   Specialty Services Required    Requested Specialty:   Orthopedic Surgery    Number of Visits Requested:   1     I discussed the assessment and treatment plan with the patient. The patient was provided an opportunity to ask questions and all were answered. The patient agreed with the plan and demonstrated an understanding of the instructions.   The patient was  advised to call back or seek an in-person evaluation if the symptoms worsen or if the condition fails to improve as anticipated.  I provided 22 minutes of non-face-to-face time during this encounter.   [name], MD

## 2020-03-02 ENCOUNTER — Encounter: Payer: Self-pay | Admitting: Internal Medicine

## 2020-03-02 NOTE — Assessment & Plan Note (Signed)
On simvastatin.  Low cholesterol diet and exercise.  Follow lipid panel and liver function tests.   

## 2020-03-02 NOTE — Assessment & Plan Note (Signed)
Increased pain right knee now.  Request referral back to ortho.

## 2020-03-02 NOTE — Assessment & Plan Note (Signed)
Had mri c-spine. Spinal stenosis and foraminal stenosis.  Saw NSU.  Currently doing better.  Pain better.  Follow.

## 2020-03-02 NOTE — Assessment & Plan Note (Signed)
Blood pressure better on current regimen.  Continue norvasc.  Follow pressures.  Follow metabolic panel.

## 2020-03-02 NOTE — Assessment & Plan Note (Signed)
Doing well. Using regularly.  Sleeping better.  Follow.

## 2020-03-04 ENCOUNTER — Ambulatory Visit: Payer: PPO | Admitting: Internal Medicine

## 2020-03-04 ENCOUNTER — Encounter: Payer: Self-pay | Admitting: Internal Medicine

## 2020-03-04 ENCOUNTER — Other Ambulatory Visit: Payer: Self-pay

## 2020-03-04 VITALS — BP 124/66 | HR 90 | Ht 73.0 in | Wt 239.8 lb

## 2020-03-04 DIAGNOSIS — G4733 Obstructive sleep apnea (adult) (pediatric): Secondary | ICD-10-CM

## 2020-03-04 NOTE — Patient Instructions (Signed)
Continue auto cpap as prescribed

## 2020-03-04 NOTE — Progress Notes (Signed)
Name: Brian Pearson MRN: [id number] DOB: 06-Nov-1936     CONSULTATION DATE: 03/04/2020  REFERRING MD :Brian Pearson  CHIEF COMPLAINT:  FOLLOW UP OSA  SYNOPSIS ESTABLISHED CARE FOR SEVERE SLEEP APNEA Patient diagnosed with severe sleep apnea AHI of 40 178 desaturation episodes 300 total episodes of apnea and hypopnea Excellent compliance report previous office visit 100%  HISTORY OF PRESENT ILLNESS: Patient continues to do well with therapy for sleep apnea Patient has excellent compliance report of 100% for days and greater than 4 hours AHI is down to 5.7 Previous AHI was 40  Overall patient feels so much better Excessive daytime sleepiness has resolved +refreshed sleep   No evidence of heart failure at this time No evidence or signs of infection at this time No respiratory distress No fevers, chills, nausea, vomiting, diarrhea No evidence of lower extremity edema No evidence hemoptysis    Discussed sleep data and reviewed with patient.  Encouraged proper weight management.  Discussed driving precautions and its relationship with hypersomnolence.  Discussed operating dangerous equipment and its relationship with hypersomnolence.  Discussed sleep hygiene, and benefits of Pearson fixed sleep waked time.  The importance of getting eight or more hours of sleep discussed with patient.  Discussed limiting the use of the computer and television before bedtime.  Decrease naps during the day, so night time sleep will become enhanced.  Limit caffeine, and sleep deprivation.  HTN, stroke, and heart failure are potential risk factors.        PAST MEDICAL HISTORY :   has Pearson past medical history of Arthritis, BPH with obstruction/lower urinary tract symptoms, GERD (gastroesophageal reflux disease), Hyperlipidemia, Hypertension, Obesity, Pneumonia, Urinary frequency, and Urine incontinence.  has Pearson past surgical history that includes Hernia repair; Colonoscopy with propofol (N/Pearson,  02/09/2016); and Esophagogastroduodenoscopy (egd) with propofol (N/Pearson, 02/09/2016). Prior to Admission medications   Medication Sig Start Date End Date Taking? Authorizing Provider  amLODipine (NORVASC) 2.5 MG tablet TAKE 1 TABLET BY MOUTH ONCE DAILY 12/18/18   Brian Pheasant, MD  aspirin EC 81 MG tablet Take by mouth.    [provider]  Cholecalciferol (VITAMIN D-3) 1000 units CAPS Take 1 capsule by mouth.    [provider]  Coenzyme Q10 (COQ10 PO) Take 1 capsule by mouth daily.    [provider]  fesoterodine (TOVIAZ) 4 MG TB24 tablet Take 1 tablet (4 mg total) by mouth daily. 05/22/18   Brian Council A, PA-C  finasteride (PROSCAR) 5 MG tablet Take 1 tablet (5 mg total) by mouth daily. 05/01/19   Brian Council A, PA-C  Multiple Vitamin (MULTI-VITAMINS) TABS Take by mouth.    [provider]  Omega-3 Fatty Acids (FISH OIL PO) Take by mouth.    [provider]  omeprazole (PRILOSEC) 20 MG capsule TAKE 1 CAPSULE BY MOUTH ONCE DAILY 11/14/18   Brian Pheasant, MD  simvastatin (ZOCOR) 20 MG tablet TAKE 1 TABLET BY MOUTH AT BEDTIME NEEDS  APPOINTMENT  FOR  ADDITIONAL  OR  90  DAY  REFILLS 04/04/19   Brian Pheasant, MD  tamsulosin (FLOMAX) 0.4 MG CAPS capsule Take 1 capsule (0.4 mg total) by mouth daily. 05/01/19   Brian Council A, PA-C   No Known Allergies SOCIAL HISTORY:  reports that he has never smoked. He has never used smokeless tobacco. He reports current alcohol use. He reports that he does not use drugs.     Review of Systems:  Gen:  Denies  fever, sweats, chills weight loss  HEENT: Denies blurred vision, double vision, ear pain, eye pain, hearing loss, nose bleeds, sore throat Cardiac:  No dizziness, chest pain or heaviness, chest tightness,edema, No JVD Resp:   No cough, -sputum production, -shortness of breath,-wheezing, -hemoptysis,  Gi: Denies swallowing difficulty, stomach pain, nausea or vomiting, diarrhea, constipation, bowel  incontinence Gu:  Denies bladder incontinence, burning urine Ext:   Denies Joint pain, stiffness or swelling Skin: Denies  skin rash, easy bruising or bleeding or hives Endoc:  Denies polyuria, polydipsia , polyphagia or weight change Psych:   Denies depression, insomnia or hallucinations  Other:  All other systems negative  BP 124/66 (BP Location: Left Arm, Cuff Size: Normal)   Pulse 90   Ht 6\' 1"  (1.854 m)   Wt 239 lb 12.8 oz (108.8 kg)   SpO2 98%   BMI 31.64 kg/m    Physical Examination:   General Appearance: No distress  Neuro:without focal findings,  speech normal,  HEENT: PERRLA, EOM intact.   Pulmonary: normal breath sounds, No wheezing.  CardiovascularNormal S1,S2.  No m/r/g.   Abdomen: Benign, Soft, non-tender. Renal:  No costovertebral tenderness  GU:  Not performed at this time. Endoc: No evident thyromegaly Skin:   warm, no rashes, no ecchymosis  Extremities: normal, no cyanosis, clubbing. PSYCHIATRIC: Mood, affect within normal limits.   ALL OTHER ROS ARE NEGATIVE    ASSESSMENT AND PLAN SYNOPSIS  Severe sleep apnea Patient uses and benefits from auto CPAP therapy Patient's AHI has significantly improved His symptoms have significantly improved  Sleep study results and compliance report reviewed thoroughly with patient Patient with excellent compliance report   Hypertension - Sleep apnea can contribute to hypertension, therefore treatment of sleep apnea is important part of hypertension management.     COVID-19 EDUCATION: The signs and symptoms of COVID-19 were discussed with the patient and how to seek care for testing.  The importance of social distancing was discussed today. Hand Washing Techniques and avoid touching face was advised.     MEDICATION ADJUSTMENTS/LABS AND TESTS ORDERED: Continue auto cpap as prescribed   CURRENT MEDICATIONS REVIEWED AT LENGTH WITH PATIENT TODAY   Patient satisfied with Plan of action and management.  All questions answered  Follow up in 1 year   Brian Pearson, M.D.  [facility]  Medical Director [place] Director [facility] Cardio-Pulmonary Department

## 2020-03-10 DIAGNOSIS — M170 Bilateral primary osteoarthritis of knee: Secondary | ICD-10-CM | POA: Diagnosis not present

## 2020-03-18 DIAGNOSIS — G4733 Obstructive sleep apnea (adult) (pediatric): Secondary | ICD-10-CM | POA: Diagnosis not present

## 2020-04-18 DIAGNOSIS — G4733 Obstructive sleep apnea (adult) (pediatric): Secondary | ICD-10-CM | POA: Diagnosis not present

## 2020-04-25 DIAGNOSIS — H2513 Age-related nuclear cataract, bilateral: Secondary | ICD-10-CM | POA: Diagnosis not present

## 2020-04-28 ENCOUNTER — Other Ambulatory Visit: Payer: Self-pay

## 2020-04-29 ENCOUNTER — Other Ambulatory Visit: Payer: Self-pay

## 2020-04-29 ENCOUNTER — Other Ambulatory Visit (INDEPENDENT_AMBULATORY_CARE_PROVIDER_SITE_OTHER): Payer: PPO

## 2020-04-29 DIAGNOSIS — I10 Essential (primary) hypertension: Secondary | ICD-10-CM | POA: Diagnosis not present

## 2020-04-29 DIAGNOSIS — E785 Hyperlipidemia, unspecified: Secondary | ICD-10-CM | POA: Diagnosis not present

## 2020-04-29 LAB — BASIC METABOLIC PANEL
BUN: 17 mg/dL (ref 6–23)
CO2: 27 mEq/L (ref 19–32)
Calcium: 9.1 mg/dL (ref 8.4–10.5)
Chloride: 105 mEq/L (ref 96–112)
Creatinine, Ser: 0.91 mg/dL (ref 0.40–1.50)
GFR: 79.74 mL/min (ref >60.00)
Glucose, Bld: 105 mg/dL — ABNORMAL HIGH (ref 70–99)
Potassium: 3.8 mEq/L (ref 3.5–5.1)
Sodium: 139 mEq/L (ref 135–145)

## 2020-04-29 LAB — LIPID PANEL
Cholesterol: 160 mg/dL (ref 0–200)
HDL: 52.1 mg/dL (ref >39.00)
LDL Cholesterol: 83 mg/dL (ref 0–99)
NonHDL: 107.83
Total CHOL/HDL Ratio: 3
Triglycerides: 126 mg/dL (ref 0.0–149.0)
VLDL: 25.2 mg/dL (ref 0.0–40.0)

## 2020-04-29 LAB — HEPATIC FUNCTION PANEL
ALT: 15 U/L (ref 0–53)
AST: 17 U/L (ref 0–37)
Albumin: 4.3 g/dL (ref 3.5–5.2)
Alkaline Phosphatase: 68 U/L (ref 39–117)
Bilirubin, Direct: 0.2 mg/dL (ref 0.0–0.3)
Total Bilirubin: 0.7 mg/dL (ref 0.2–1.2)
Total Protein: 6.8 g/dL (ref 6.0–8.3)

## 2020-04-30 ENCOUNTER — Encounter: Payer: Self-pay | Admitting: Urology

## 2020-04-30 ENCOUNTER — Ambulatory Visit: Payer: PPO | Admitting: Urology

## 2020-04-30 VITALS — BP 172/75 | HR 81 | Ht 73.0 in | Wt 239.0 lb

## 2020-04-30 DIAGNOSIS — N3941 Urge incontinence: Secondary | ICD-10-CM

## 2020-04-30 DIAGNOSIS — N401 Enlarged prostate with lower urinary tract symptoms: Secondary | ICD-10-CM | POA: Diagnosis not present

## 2020-04-30 DIAGNOSIS — N529 Male erectile dysfunction, unspecified: Secondary | ICD-10-CM

## 2020-04-30 LAB — URINALYSIS, COMPLETE
Bilirubin, UA: NEGATIVE
Glucose, UA: NEGATIVE
Ketones, UA: NEGATIVE
Nitrite, UA: NEGATIVE
Protein,UA: NEGATIVE
RBC, UA: NEGATIVE
Specific Gravity, UA: 1.025 (ref 1.005–1.030)
Urobilinogen, Ur: 0.2 mg/dL (ref 0.2–1.0)
pH, UA: 5.5 (ref 5.0–7.5)

## 2020-04-30 LAB — BLADDER SCAN AMB NON-IMAGING: Scan Result: 0

## 2020-04-30 LAB — MICROSCOPIC EXAMINATION: Bacteria, UA: NONE SEEN

## 2020-04-30 MED ORDER — TADALAFIL 20 MG PO TABS: 20.0000 mg | ORAL_TABLET | Freq: Every day | ORAL | 1 refills | Status: DC | PRN

## 2020-04-30 MED ORDER — TAMSULOSIN HCL 0.4 MG PO CAPS: 0.4000 mg | ORAL_CAPSULE | Freq: Every day | ORAL | 3 refills | Status: DC

## 2020-04-30 MED ORDER — FINASTERIDE 5 MG PO TABS: 5.0000 mg | ORAL_TABLET | Freq: Every day | ORAL | 3 refills | Status: DC

## 2020-04-30 NOTE — Progress Notes (Signed)
04/30/2020 9:45 AM   Brian Pearson 1936-07-25 [id number]  Referring provider: [name], MD [id number] [place],  [place] [address]  Chief Complaint  Patient presents with  . Benign Prostatic Hypertrophy    HPI: 84 yo male with BPH with LU TS and ED who presents today for a one year follow up.  BPH WITH LUTS His IPSS score today is 15, which is severe lower urinary tract symptomatology.   He is mostly satisfied with his quality life due to his urinary symptoms.  His PVR is 0 mL.   His previous I PSS score was 23/3.  His previous PVR is 0 mL.  His major complaint at this time is urinary urgency.  Patient denies any modifying or aggravating factors.  Patient denies any gross hematuria, dysuria or suprapubic/flank pain.  Patient denies any fevers, chills, nausea or vomiting.   He is currently taking tamsulosin 0.4 mg and finasteride 5 mg daily.  UA today is benign.  He is also using CPAP and has found a significant decrease in his nocturia.   IPSS    Row Name 04/30/20 0900         International Prostate Symptom Score   How often have you had the sensation of not emptying your bladder?  Less than half the time     How often have you had to urinate less than every two hours?  About half the time     How often have you found you stopped and started again several times when you urinated?  About half the time     How often have you found it difficult to postpone urination?  Almost always     How often have you had a weak urinary stream?  Less than 1 in 5 times     How often have you had to strain to start urination?  Not at All     How many times did you typically get up at night to urinate?  1 Time     Total IPSS Score  15       Quality of Life due to urinary symptoms   If you were to spend the rest of your life with your urinary condition just the way it is now how would you feel about that?  Mostly Satisfied        Score:  1-7 Mild 8-19  Moderate 20-35 Severe  Erectile dysfunction His risk factors for ED are age, BPH, HTN and HLD.  He has been experiencing ED for several years.  He denies any painful erections or curvatures with his erections.   He is still  having spontaneous erections.  He has tried Viagra in the past, but he did not find it effective.       PMH: Past Medical History:  Diagnosis Date  . Arthritis   . BPH with obstruction/lower urinary tract symptoms   . GERD (gastroesophageal reflux disease)   . Hyperlipidemia    taken off simvastatin last year  . Hypertension   . Obesity   . Pneumonia    hospitalized   . Urinary frequency   . Urine incontinence     Surgical History: Past Surgical History:  Procedure Laterality Date  . COLONOSCOPY WITH PROPOFOL N/A 02/09/2016   Procedure: COLONOSCOPY WITH PROPOFOL;  Surgeon: Hulen Luster, MD;  Location[facility] ENDOSCOPY;  Service: Gastroenterology;  Laterality: N/A;  . ESOPHAGOGASTRODUODENOSCOPY (EGD) WITH PROPOFOL N/A 02/09/2016   Procedure: ESOPHAGOGASTRODUODENOSCOPY (EGD) WITH  PROPOFOL;  Surgeon: Hulen Luster, MD;  Location: [facility] ENDOSCOPY;  Service: Gastroenterology;  Laterality: N/A;  . HERNIA REPAIR     1990's - 3 surgeries    Home Medications:  Allergies as of 04/30/2020   No Known Allergies     Medication List       Accurate as of Apr 30, 2020  9:45 AM. If you have any questions, ask your nurse or doctor.        amLODipine 10 MG tablet Commonly known as: NORVASC Take 1 tablet (10 mg total) by mouth daily.   aspirin EC 81 MG tablet Take by mouth.   COQ10 PO Take 1 capsule by mouth daily.   fesoterodine 4 MG Tb24 tablet Commonly known as: Toviaz Take 1 tablet (4 mg total) by mouth daily.   finasteride 5 MG tablet Commonly known as: PROSCAR Take 1 tablet (5 mg total) by mouth daily.   FISH OIL PO Take by mouth.   Multi-Vitamins Tabs Take by mouth.   omeprazole 20 MG capsule Commonly known as: PRILOSEC Take 1 capsule by mouth once  daily   simvastatin 20 MG tablet Commonly known as: ZOCOR TAKE 1 TABLET BY MOUTH AT BEDTIME. NEED APPT.   tadalafil 20 MG tablet Commonly known as: CIALIS Take 1 tablet (20 mg total) by mouth daily as needed for erectile dysfunction. Started by: Zara Council, PA-C   tamsulosin 0.4 MG Caps capsule Commonly known as: FLOMAX Take 1 capsule (0.4 mg total) by mouth daily.   Vitamin D-3 25 MCG (1000 UT) Caps Take 1 capsule by mouth.       Allergies: No Known Allergies  Family History: Family History  Problem Relation Age of Onset  . Arthritis Mother   . Heart disease Mother        CHF  . Cancer Father        Prostate  . Dementia Father   . Alzheimer's disease Father   . Hypotension Sister   . Kidney cancer Neg Hx   . Bladder Cancer Neg Hx     Social History:  reports that he has never smoked. He has never used smokeless tobacco. He reports current alcohol use. He reports that he does not use drugs.  ROS: For pertinent review of systems please refer to history of present illness  Physical Exam: BP (!) 172/75   Pulse 81   Ht 6\' 1"  (1.854 m)   Wt 239 lb (108.4 kg)   BMI 31.53 kg/m   Constitutional:  Well nourished. Alert and oriented, No acute distress. HEENT: [place] AT, mask in place.  Trachea midline Cardiovascular: No clubbing, cyanosis, or edema. Respiratory: Normal respiratory effort, no increased work of breathing. GI: Abdomen is soft, non tender, non distended, no abdominal masses.  GU: No CVA tenderness.  No bladder fullness or masses.  Patient with uncircumcised phallus.  Foreskin easily retracted.  Urethral meatus is patent.  No penile discharge. No penile lesions or rashes. Scrotum without lesions, cysts, rashes and/or edema.  Testicles are located scrotally bilaterally. No masses are appreciated in the testicles. Left and right epididymis are normal. Rectal: Patient with  normal sphincter tone. Anus and perineum without scarring or rashes. No rectal masses  are appreciated. Prostate is approximately 60 + grams, could only palpate the apex, no nodules are appreciated. Seminal vesicles could not be palpated.  Skin: No rashes, bruises or suspicious lesions. Lymph: No inguinal adenopathy. Neurologic: Grossly intact, no focal deficits, moving all 4 extremities. Psychiatric: Normal  mood and affect.   Laboratory Data: Urinalysis Component     Latest Ref Rng & Units 04/30/2020  Specific Gravity, UA     1.005 - 1.030 1.025  pH, UA     5.0 - 7.5 5.5  Color, UA     Yellow Yellow  Appearance Ur     Clear Clear  Leukocytes,UA     Negative Trace (A)  Protein,UA     Negative/Trace Negative  Glucose, UA     Negative Negative  Ketones, UA     Negative Negative  RBC, UA     Negative Negative  Bilirubin, UA     Negative Negative  Urobilinogen, Ur     0.2 - 1.0 mg/dL 0.2  Nitrite, UA     Negative Negative  Microscopic Examination      See below:   Component     Latest Ref Rng & Units 04/30/2020  WBC, UA     0 - 5 /hpf 0-5  RBC     0 - 2 /hpf 0-2  Epithelial Cells (non renal)     0 - 10 /hpf 0-10  Mucus, UA     Not Estab. Present (A)  Bacteria, UA     None seen/Few None seen    Lab Results  Component Value Date   WBC 5.0 10/05/2019   HGB 13.9 10/05/2019   HCT 41.7 10/05/2019   MCV 94.7 10/05/2019   PLT 181.0 10/05/2019    Lab Results  Component Value Date   CREATININE 0.91 04/29/2020    Lab Results  Component Value Date   PSA 1.08 10/05/2019   PSA 0.92 07/19/2017   PSA 2.50 02/21/2015     Lab Results  Component Value Date   TSH 2.60 10/05/2019       Component Value Date/Time   CHOL 160 04/29/2020 0824   HDL 52.10 04/29/2020 0824   CHOLHDL 3 04/29/2020 0824   VLDL 25.2 04/29/2020 0824   LDLCALC 83 04/29/2020 0824    Lab Results  Component Value Date   AST 17 04/29/2020   Lab Results  Component Value Date   ALT 15 04/29/2020   I have reviewed the labs  Assessment & Plan:    1. BPH with LUTS -  IPSS score is 15/2, it is improving - Continue conservative management, avoiding bladder irritants and timed voiding's - Most bothersome symptoms is/are urge incontinence  - Continue tamsulosin 0.4 mg daily and finasteride 5 mg daily; refills given - RTC in 12 months for IPSS and exam   2.  Urge incontinence - Managing conservatively - Does not desire any further treatment or investigation at this time  3. Erectile dysfunction - Would like a trial of on demand-dosing of Cialis 20 mg - Prescription sent to pharmacy, denied nitrate use   Return in about 1 year (around 04/30/2021) for  ipss, shim, ua, pvr and exam.  These notes generated with voice recognition software. I apologize for typographical errors.  Zara Council, PA-C  [facility] [address] [place], [place] [address] [phone]

## 2020-05-02 ENCOUNTER — Ambulatory Visit (INDEPENDENT_AMBULATORY_CARE_PROVIDER_SITE_OTHER): Payer: PPO | Admitting: Internal Medicine

## 2020-05-02 ENCOUNTER — Other Ambulatory Visit: Payer: Self-pay

## 2020-05-02 ENCOUNTER — Encounter: Payer: Self-pay | Admitting: Internal Medicine

## 2020-05-02 DIAGNOSIS — E785 Hyperlipidemia, unspecified: Secondary | ICD-10-CM | POA: Diagnosis not present

## 2020-05-02 DIAGNOSIS — G4733 Obstructive sleep apnea (adult) (pediatric): Secondary | ICD-10-CM

## 2020-05-02 DIAGNOSIS — I10 Essential (primary) hypertension: Secondary | ICD-10-CM

## 2020-05-02 DIAGNOSIS — Z9989 Dependence on other enabling machines and devices: Secondary | ICD-10-CM

## 2020-05-02 DIAGNOSIS — R413 Other amnesia: Secondary | ICD-10-CM | POA: Diagnosis not present

## 2020-05-02 MED ORDER — LOSARTAN POTASSIUM 25 MG PO TABS
25.0000 mg | ORAL_TABLET | Freq: Every day | ORAL | 2 refills | Status: DC
Start: 2020-05-02 — End: 2020-07-31

## 2020-05-02 NOTE — Assessment & Plan Note (Signed)
Blood pressure remains elevated.  Continue amlodipine.  Start losartan 25mg  q day.  Check metabolic panel in [id number] days.  Follow pressures.  Get him back in soon to reassess.

## 2020-05-02 NOTE — Assessment & Plan Note (Signed)
On simvastatin.  Low cholesterol diet and exercise.  Follow lipid panel and liver function tests.   

## 2020-05-02 NOTE — Progress Notes (Signed)
Patient ID: Brian Pearson, male   DOB: 1936-03-26, 84 y.o.   MRN: [id number]   Subjective:    Patient ID: Brian Pearson, male    DOB: [birth year], 84 y.o.   MRN: [id number]  HPI This visit occurred during the SARS-CoV-2 public health emergency.  Safety protocols were in place, including screening questions prior to the visit, additional usage of staff PPE, and extensive cleaning of exam room while observing appropriate contact time as indicated for disinfecting solutions.  Patient here for a scheduled follow up. Here to follow up regarding his blood pressure and cholesterol.  Just saw urology 04/30/20 for f/u urinary urgency. Recommended continuing tamsulosin and finasteride.  Urinary symptoms stable.  Stays active.  No chest pain or sob.  No allergy or sinus issues.  No abdominal pain.  Bowels moving.  States blood pressures at home averaging 135-140/65-66.  Sometimes may be a little higher.  No headache or dizziness.  Does report some changes with his memory.  States may have problems remembering names.  Also, occasionally will have issues with directions.  No problems with finances, etc.    Past Medical History:  Diagnosis Date  . Arthritis   . BPH with obstruction/lower urinary tract symptoms   . GERD (gastroesophageal reflux disease)   . Hyperlipidemia    taken off simvastatin last year  . Hypertension   . Obesity   . Pneumonia    hospitalized   . Urinary frequency   . Urine incontinence    Past Surgical History:  Procedure Laterality Date  . COLONOSCOPY WITH PROPOFOL N/A 02/09/2016   Procedure: COLONOSCOPY WITH PROPOFOL;  Surgeon: Hulen Luster, MD;  Location: [facility] ENDOSCOPY;  Service: Gastroenterology;  Laterality: N/A;  . ESOPHAGOGASTRODUODENOSCOPY (EGD) WITH PROPOFOL N/A 02/09/2016   Procedure: ESOPHAGOGASTRODUODENOSCOPY (EGD) WITH PROPOFOL;  Surgeon: Hulen Luster, MD;  Location: [facility] ENDOSCOPY;  Service: Gastroenterology;  Laterality: N/A;  . HERNIA REPAIR     1990's - 3 surgeries     Family History  Problem Relation Age of Onset  . Arthritis Mother   . Heart disease Mother        CHF  . Cancer Father        Prostate  . Dementia Father   . Alzheimer's disease Father   . Hypotension Sister   . Kidney cancer Neg Hx   . Bladder Cancer Neg Hx    Social History   Socioeconomic History  . Marital status: Married    Spouse name: Not on file  . Number of children: Not on file  . Years of education: Not on file  . Highest education level: Not on file  Occupational History  . Not on file  Tobacco Use  . Smoking status: Never Smoker  . Smokeless tobacco: Never Used  Substance and Sexual Activity  . Alcohol use: Yes    Alcohol/week: 0.0 standard drinks    Comment: OCC glass of wine  . Drug use: No  . Sexual activity: Yes  Other Topics Concern  . Not on file  Social History Narrative   Lives in [place].      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 Difficulty of Paying  Living Expenses:   Food Insecurity:   . Worried About Charity fundraiser in the Last Year:   . Arboriculturist in the Last Year:   Transportation Needs:   . Film/video editor (Medical):   Marland Kitchen Lack of Transportation (Non-Medical):   Physical Activity:   . Days of Exercise per Week:   . Minutes of Exercise per Session:   Stress:   . Feeling of Stress :   Social Connections:   . Frequency of Communication with Friends and Family:   . Frequency of Social Gatherings with Friends and Family:   . Attends Religious Services:   . Active Member of Clubs or Organizations:   . Attends Archivist Meetings:   Marland Kitchen Marital Status:     Outpatient Encounter Medications as of 05/02/2020  Medication Sig  . amLODipine (NORVASC) 10 MG tablet Take 1 tablet (10 mg total) by mouth daily.   Marland Kitchen aspirin EC 81 MG tablet Take by mouth.  . Cholecalciferol (VITAMIN D-3) 1000 units CAPS Take 1 capsule by mouth.  . Coenzyme Q10 (COQ10 PO) Take 1 capsule by mouth daily.  . fesoterodine (TOVIAZ) 4 MG TB24 tablet Take 1 tablet (4 mg total) by mouth daily.  . finasteride (PROSCAR) 5 MG tablet Take 1 tablet (5 mg total) by mouth daily.  . Multiple Vitamin (MULTI-VITAMINS) TABS Take by mouth.  . Omega-3 Fatty Acids (FISH OIL PO) Take by mouth.  Marland Kitchen omeprazole (PRILOSEC) 20 MG capsule Take 1 capsule by mouth once daily  . simvastatin (ZOCOR) 20 MG tablet TAKE 1 TABLET BY MOUTH AT BEDTIME. NEED APPT.  . tadalafil (CIALIS) 20 MG tablet Take 1 tablet (20 mg total) by mouth daily as needed for erectile dysfunction.  . tamsulosin (FLOMAX) 0.4 MG CAPS capsule Take 1 capsule (0.4 mg total) by mouth daily.  Marland Kitchen losartan (COZAAR) 25 MG tablet Take 1 tablet (25 mg total) by mouth daily.   No facility-administered encounter medications on file as of 05/02/2020.    Review of Systems  Constitutional: Negative for appetite change and unexpected weight change.  HENT: Negative for congestion and sinus pressure.   Respiratory: Negative for cough, chest tightness and shortness of breath.   Cardiovascular: Negative for chest pain, palpitations and leg swelling.  Gastrointestinal: Negative for abdominal pain, diarrhea, nausea and vomiting.  Genitourinary: Negative for difficulty urinating and dysuria.  Musculoskeletal: Negative for joint swelling and myalgias.  Skin: Negative for color change and rash.  Neurological: Negative for dizziness, light-headedness and headaches.  Psychiatric/Behavioral: Negative for agitation and dysphoric mood.       Objective:    Physical Exam Vitals reviewed.  Constitutional:      General: He is not in acute distress.    Appearance: Normal appearance. He is well-developed.  HENT:     Head: Normocephalic and atraumatic.     Right Ear: External ear normal.     Left Ear:  External ear normal.  Eyes:     General: No scleral icterus.       Right eye: No discharge.        Left eye: No discharge.     Conjunctiva/sclera: Conjunctivae normal.  Cardiovascular:     Rate and Rhythm: Normal rate and regular rhythm.  Pulmonary:     Effort: Pulmonary effort is normal. No respiratory distress.     Breath sounds: Normal breath sounds.  Abdominal:     General: Bowel sounds are normal.     Palpations: Abdomen is  soft.     Tenderness: There is no abdominal tenderness.  Musculoskeletal:        General: No tenderness.     Cervical back: Neck supple. No tenderness.     Comments: No increased lower extremity swelling  Stable.    Lymphadenopathy:     Cervical: No cervical adenopathy.  Skin:    Findings: No erythema or rash.  Neurological:     Mental Status: He is alert.  Psychiatric:        Mood and Affect: Mood normal.        Behavior: Behavior normal.     BP (!) 158/62   Pulse 81   Temp 97.8 F (36.6 C)   Resp 16   Ht 6\' 1"  (1.854 m)   Wt 238 lb (108 kg)   SpO2 97%   BMI 31.40 kg/m  Wt Readings from Last 3 Encounters:  05/02/20 238 lb (108 kg)  04/30/20 239 lb (108.4 kg)  03/04/20 239 lb 12.8 oz (108.8 kg)     Lab Results  Component Value Date   WBC 5.0 10/05/2019   HGB 13.9 10/05/2019   HCT 41.7 10/05/2019   PLT 181.0 10/05/2019   GLUCOSE 105 (H) 04/29/2020   CHOL 160 04/29/2020   TRIG 126.0 04/29/2020   HDL 52.10 04/29/2020   LDLCALC 83 04/29/2020   ALT 15 04/29/2020   AST 17 04/29/2020   NA 139 04/29/2020   K 3.8 04/29/2020   CL 105 04/29/2020   CREATININE 0.91 04/29/2020   BUN 17 04/29/2020   CO2 27 04/29/2020   TSH 2.60 10/05/2019   PSA 1.08 10/05/2019   MICROALBUR 0.7 10/22/2014    MR Cervical Spine Wo Contrast  Result Date: 09/23/2019 CLINICAL DATA:  Chronic neck pain radiating to left shoulder EXAM: MRI CERVICAL SPINE WITHOUT CONTRAST TECHNIQUE: Multiplanar, multisequence MR imaging of the cervical spine was performed. No  intravenous contrast was administered. COMPARISON:  None. FINDINGS: Alignment: Straightening of normal lordosis. Grade 1 retrolisthesis at C4-5. Vertebrae: Mild facet edema at left C2-3. No fracture. Cord: Normal signal and morphology. Posterior Fossa, vertebral arteries, paraspinal tissues: Negative. Disc levels: C2-3: No disc herniation. Left-greater-than-right facet hypertrophy with severe left neural foraminal stenosis. C3-4: Small central disc protrusion effacing the ventral thecal sac. No spinal canal stenosis. Right-greater-than-left facet hypertrophy. Mild left foraminal stenosis. C4-5: Intermediate sized disc osteophyte complex with severe spinal canal stenosis and severe right foraminal stenosis. Moderate left neural foraminal stenosis. C5-6: Small disc bulge. No spinal canal or neural foraminal stenosis. C6-7: Small central disc protrusion with bilateral uncovertebral hypertrophy. Moderate spinal canal stenosis. No neural foraminal stenosis. C7-T1: Normal. Sagittal imaging of T1-2 and T2-3 is unremarkable. IMPRESSION: 1. Severe spinal canal stenosis and right neural foraminal stenosis at C4-5. 2. Severe left C2-3 neural foraminal stenosis. 3. Moderate spinal canal stenosis at C6-7. Electronically Signed   By: Ulyses Jarred M.D.   On: 09/23/2019 02:30       Assessment & Plan:   Problem List Items Addressed This Visit    Essential hypertension    Blood pressure remains elevated.  Continue amlodipine.  Start losartan 25mg  q day.  Check metabolic panel in [id number] days.  Follow pressures.  Get him back in soon to reassess.        Relevant Medications   losartan (COZAAR) 25 MG tablet   Other Relevant Orders   Basic metabolic panel   Hyperlipidemia    On simvastatin.  Low cholesterol diet and exercise.  Follow lipid  panel and liver function tests.        Relevant Medications   losartan (COZAAR) 25 MG tablet   Memory change    Has noticed some change as outlined.  Did well on mini mental  status.  Follow.  Can check B12 with next labs.        OSA on CPAP    Continue cpap.            [name], MD

## 2020-05-03 ENCOUNTER — Encounter: Payer: Self-pay | Admitting: Internal Medicine

## 2020-05-03 NOTE — Assessment & Plan Note (Signed)
Continue cpap.  

## 2020-05-03 NOTE — Assessment & Plan Note (Signed)
Has noticed some change as outlined.  Did well on mini mental status.  Follow.  Can check B12 with next labs.

## 2020-05-13 ENCOUNTER — Other Ambulatory Visit: Payer: Self-pay

## 2020-05-13 ENCOUNTER — Other Ambulatory Visit (INDEPENDENT_AMBULATORY_CARE_PROVIDER_SITE_OTHER): Payer: PPO

## 2020-05-13 ENCOUNTER — Telehealth: Payer: Self-pay | Admitting: Internal Medicine

## 2020-05-13 DIAGNOSIS — I10 Essential (primary) hypertension: Secondary | ICD-10-CM | POA: Diagnosis not present

## 2020-05-13 LAB — BASIC METABOLIC PANEL
BUN: 16 mg/dL (ref 6–23)
CO2: 26 mEq/L (ref 19–32)
Calcium: 9.2 mg/dL (ref 8.4–10.5)
Chloride: 106 mEq/L (ref 96–112)
Creatinine, Ser: 0.86 mg/dL (ref 0.40–1.50)
GFR: 85.1 mL/min (ref >60.00)
Glucose, Bld: 104 mg/dL — ABNORMAL HIGH (ref 70–99)
Potassium: 3.9 mEq/L (ref 3.5–5.1)
Sodium: 139 mEq/L (ref 135–145)

## 2020-05-13 NOTE — Telephone Encounter (Signed)
Pt dropped off blood pressure readings. Placed in colored folder up front

## 2020-05-13 NOTE — Telephone Encounter (Signed)
Received and given to Dr. Nicki Reaper

## 2020-05-18 DIAGNOSIS — G4733 Obstructive sleep apnea (adult) (pediatric): Secondary | ICD-10-CM | POA: Diagnosis not present

## 2020-05-22 ENCOUNTER — Other Ambulatory Visit: Payer: Self-pay | Admitting: Internal Medicine

## 2020-06-17 ENCOUNTER — Ambulatory Visit (INDEPENDENT_AMBULATORY_CARE_PROVIDER_SITE_OTHER): Payer: PPO | Admitting: Internal Medicine

## 2020-06-17 ENCOUNTER — Encounter: Payer: Self-pay | Admitting: Internal Medicine

## 2020-06-17 ENCOUNTER — Other Ambulatory Visit: Payer: Self-pay

## 2020-06-17 DIAGNOSIS — E785 Hyperlipidemia, unspecified: Secondary | ICD-10-CM

## 2020-06-17 DIAGNOSIS — R413 Other amnesia: Secondary | ICD-10-CM | POA: Diagnosis not present

## 2020-06-17 DIAGNOSIS — Z9989 Dependence on other enabling machines and devices: Secondary | ICD-10-CM | POA: Diagnosis not present

## 2020-06-17 DIAGNOSIS — I10 Essential (primary) hypertension: Secondary | ICD-10-CM | POA: Diagnosis not present

## 2020-06-17 DIAGNOSIS — G4733 Obstructive sleep apnea (adult) (pediatric): Secondary | ICD-10-CM | POA: Diagnosis not present

## 2020-06-17 NOTE — Assessment & Plan Note (Signed)
On simvastatin.  Low cholesterol diet and exercise.  Follow lipid panel and liver function tests.   

## 2020-06-17 NOTE — Progress Notes (Signed)
Patient ID: Brian Pearson, male   DOB: 09/16/1936, 84 y.o.   MRN: [id number]   Subjective:    Patient ID: Brian Pearson, male    DOB: [birth year], 84 y.o.   MRN: [id number]  HPI This visit occurred during the SARS-CoV-2 public health emergency.  Safety protocols were in place, including screening questions prior to the visit, additional usage of staff PPE, and extensive cleaning of exam room while observing appropriate contact time as indicated for disinfecting solutions.  Patient here for a scheduled follow up.  Here to f/u regarding his blood pressure. Was started on losartan 05/02/20.  Blood pressures averaging 120-130s/60s.  Tolerating the losartan.  No chest pain or sob.  Staying active.  Exercises at home.  Works out and rides a stationary bike.  No acid reflux or abdominal pain reported.  Bowels stable.  Overall he feels he is doing relatively well.    Past Medical History:  Diagnosis Date  . Arthritis   . BPH with obstruction/lower urinary tract symptoms   . GERD (gastroesophageal reflux disease)   . Hyperlipidemia    taken off simvastatin last year  . Hypertension   . Obesity   . Pneumonia    hospitalized   . Urinary frequency   . Urine incontinence    Past Surgical History:  Procedure Laterality Date  . COLONOSCOPY WITH PROPOFOL N/A 02/09/2016   Procedure: COLONOSCOPY WITH PROPOFOL;  Surgeon: Hulen Luster, MD;  Location: [facility] ENDOSCOPY;  Service: Gastroenterology;  Laterality: N/A;  . ESOPHAGOGASTRODUODENOSCOPY (EGD) WITH PROPOFOL N/A 02/09/2016   Procedure: ESOPHAGOGASTRODUODENOSCOPY (EGD) WITH PROPOFOL;  Surgeon: Hulen Luster, MD;  Location: [facility] ENDOSCOPY;  Service: Gastroenterology;  Laterality: N/A;  . HERNIA REPAIR     1990's - 3 surgeries   Family History  Problem Relation Age of Onset  . Arthritis Mother   . Heart disease Mother        CHF  . Cancer Father        Prostate  . Dementia Father   . Alzheimer's disease Father   . Hypotension Sister   . Kidney cancer  Neg Hx   . Bladder Cancer Neg Hx    Social History   Socioeconomic History  . Marital status: Married    Spouse name: Not on file  . Number of children: Not on file  . Years of education: Not on file  . Highest education level: Not on file  Occupational History  . Not on file  Tobacco Use  . Smoking status: Never Smoker  . Smokeless tobacco: Never Used  Vaping Use  . Vaping Use: Never used  Substance and Sexual Activity  . Alcohol use: Yes    Alcohol/week: 0.0 standard drinks    Comment: OCC glass of wine  . Drug use: No  . Sexual activity: Yes  Other Topics Concern  . Not on file  Social History Narrative   Lives in [place].      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 Difficulty of Paying Living Expenses:   Food Insecurity:   . Worried About Charity fundraiser in the Last Year:   . [place] in the  Last Year:   Transportation Needs:   . Film/video editor (Medical):   Marland Kitchen Lack of Transportation (Non-Medical):   Physical Activity:   . Days of Exercise per Week:   . Minutes of Exercise per Session:   Stress:   . Feeling of Stress :   Social Connections:   . Frequency of Communication with Friends and Family:   . Frequency of Social Gatherings with Friends and Family:   . Attends Religious Services:   . Active Member of Clubs or Organizations:   . Attends Archivist Meetings:   Marland Kitchen Marital Status:     Outpatient Encounter Medications as of 06/17/2020  Medication Sig  . amLODipine (NORVASC) 10 MG tablet Take 1 tablet by mouth once daily  . aspirin EC 81 MG tablet Take by mouth.  . Cholecalciferol (VITAMIN D-3) 1000 units CAPS Take 1 capsule by mouth.  . Coenzyme Q10 (COQ10 PO) Take 1 capsule by mouth daily.  . fesoterodine (TOVIAZ) 4 MG  TB24 tablet Take 1 tablet (4 mg total) by mouth daily.  . finasteride (PROSCAR) 5 MG tablet Take 1 tablet (5 mg total) by mouth daily.  Marland Kitchen losartan (COZAAR) 25 MG tablet Take 1 tablet (25 mg total) by mouth daily.  . Multiple Vitamin (MULTI-VITAMINS) TABS Take by mouth.  . Omega-3 Fatty Acids (FISH OIL PO) Take by mouth.  Marland Kitchen omeprazole (PRILOSEC) 20 MG capsule Take 1 capsule by mouth once daily  . simvastatin (ZOCOR) 20 MG tablet TAKE 1 TABLET BY MOUTH AT BEDTIME. NEED APPT.  . tadalafil (CIALIS) 20 MG tablet Take 1 tablet (20 mg total) by mouth daily as needed for erectile dysfunction.  . tamsulosin (FLOMAX) 0.4 MG CAPS capsule Take 1 capsule (0.4 mg total) by mouth daily.   No facility-administered encounter medications on file as of 06/17/2020.    Review of Systems  Constitutional: Negative for appetite change and unexpected weight change.  HENT: Negative for congestion and sinus pressure.   Respiratory: Negative for cough, chest tightness and shortness of breath.   Cardiovascular: Negative for chest pain and palpitations.  Gastrointestinal: Negative for abdominal pain, diarrhea, nausea and vomiting.  Genitourinary: Negative for difficulty urinating and dysuria.  Musculoskeletal: Negative for joint swelling and myalgias.  Skin: Negative for color change and rash.  Neurological: Negative for dizziness, light-headedness and headaches.  Psychiatric/Behavioral: Negative for agitation and dysphoric mood.       Objective:    Physical Exam Vitals reviewed.  Constitutional:      General: He is not in acute distress.    Appearance: Normal appearance. He is well-developed.  HENT:     Head: Normocephalic and atraumatic.     Right Ear: External ear normal. There is no impacted cerumen.     Left Ear: External ear normal. There is no impacted cerumen.  Eyes:     General: No scleral icterus.       Right eye: No discharge.        Left eye: No discharge.     Conjunctiva/sclera: Conjunctivae  normal.  Cardiovascular:     Rate and Rhythm: Normal rate and regular rhythm.  Pulmonary:     Effort: Pulmonary effort is normal. No respiratory distress.     Breath sounds: Normal breath sounds.  Abdominal:     General: Bowel sounds are normal.     Palpations: Abdomen is soft.     Tenderness: There is no abdominal tenderness.  Musculoskeletal:        General: No  swelling or tenderness.     Cervical back: Neck supple. No tenderness.  Lymphadenopathy:     Cervical: No cervical adenopathy.  Skin:    Findings: No erythema or rash.  Neurological:     Mental Status: He is alert.  Psychiatric:        Mood and Affect: Mood normal.        Behavior: Behavior normal.     BP 138/66   Pulse 84   Temp 97.6 F (36.4 C)   Resp 16   Ht 6\' 1"  (1.854 m)   Wt 240 lb (108.9 kg)   SpO2 98%   BMI 31.66 kg/m  Wt Readings from Last 3 Encounters:  06/17/20 240 lb (108.9 kg)  05/02/20 238 lb (108 kg)  04/30/20 239 lb (108.4 kg)     Lab Results  Component Value Date   WBC 5.0 10/05/2019   HGB 13.9 10/05/2019   HCT 41.7 10/05/2019   PLT 181.0 10/05/2019   GLUCOSE 104 (H) 05/13/2020   CHOL 160 04/29/2020   TRIG 126.0 04/29/2020   HDL 52.10 04/29/2020   LDLCALC 83 04/29/2020   ALT 15 04/29/2020   AST 17 04/29/2020   NA 139 05/13/2020   K 3.9 05/13/2020   CL 106 05/13/2020   CREATININE 0.86 05/13/2020   BUN 16 05/13/2020   CO2 26 05/13/2020   TSH 2.60 10/05/2019   PSA 1.08 10/05/2019   MICROALBUR 0.7 10/22/2014    MR Cervical Spine Wo Contrast  Result Date: 09/23/2019 CLINICAL DATA:  Chronic neck pain radiating to left shoulder EXAM: MRI CERVICAL SPINE WITHOUT CONTRAST TECHNIQUE: Multiplanar, multisequence MR imaging of the cervical spine was performed. No intravenous contrast was administered. COMPARISON:  None. FINDINGS: Alignment: Straightening of normal lordosis. Grade 1 retrolisthesis at C4-5. Vertebrae: Mild facet edema at left C2-3. No fracture. Cord: Normal signal and  morphology. Posterior Fossa, vertebral arteries, paraspinal tissues: Negative. Disc levels: C2-3: No disc herniation. Left-greater-than-right facet hypertrophy with severe left neural foraminal stenosis. C3-4: Small central disc protrusion effacing the ventral thecal sac. No spinal canal stenosis. Right-greater-than-left facet hypertrophy. Mild left foraminal stenosis. C4-5: Intermediate sized disc osteophyte complex with severe spinal canal stenosis and severe right foraminal stenosis. Moderate left neural foraminal stenosis. C5-6: Small disc bulge. No spinal canal or neural foraminal stenosis. C6-7: Small central disc protrusion with bilateral uncovertebral hypertrophy. Moderate spinal canal stenosis. No neural foraminal stenosis. C7-T1: Normal. Sagittal imaging of T1-2 and T2-3 is unremarkable. IMPRESSION: 1. Severe spinal canal stenosis and right neural foraminal stenosis at C4-5. 2. Severe left C2-3 neural foraminal stenosis. 3. Moderate spinal canal stenosis at C6-7. Electronically Signed   By: Ulyses Jarred M.D.   On: 09/23/2019 02:30       Assessment & Plan:   Problem List Items Addressed This Visit    Essential hypertension    Blood pressures as outlined.  Recheck today 136/64.  Continue amlodipine and losartan.  Follow pressures.  Follow metabolic panel.        Relevant Orders   CBC with Differential/Platelet   Basic metabolic panel   Hyperlipidemia    On simvastatin.  Low cholesterol diet and exercise.  Follow lipid panel and liver function tests.        Relevant Orders   Hepatic function panel   Lipid panel   Memory change    Discussed last visit.  Did well on mini mental status.  Check routine labs including B12.       Relevant Orders  TSH   Vitamin B12   OSA on CPAP    Continue cpap.           [name], MD

## 2020-06-17 NOTE — Assessment & Plan Note (Signed)
Continue cpap.  

## 2020-06-17 NOTE — Assessment & Plan Note (Signed)
Blood pressures as outlined.  Recheck today 136/64.  Continue amlodipine and losartan.  Follow pressures.  Follow metabolic panel.

## 2020-06-17 NOTE — Assessment & Plan Note (Signed)
Discussed last visit.  Did well on mini mental status.  Check routine labs including B12.

## 2020-06-18 DIAGNOSIS — G4733 Obstructive sleep apnea (adult) (pediatric): Secondary | ICD-10-CM | POA: Diagnosis not present

## 2020-07-15 ENCOUNTER — Other Ambulatory Visit: Payer: Self-pay

## 2020-07-18 DIAGNOSIS — G4733 Obstructive sleep apnea (adult) (pediatric): Secondary | ICD-10-CM | POA: Diagnosis not present

## 2020-07-28 DIAGNOSIS — M170 Bilateral primary osteoarthritis of knee: Secondary | ICD-10-CM | POA: Diagnosis not present

## 2020-07-31 ENCOUNTER — Other Ambulatory Visit: Payer: Self-pay | Admitting: Internal Medicine

## 2020-08-21 ENCOUNTER — Other Ambulatory Visit: Payer: Self-pay | Admitting: Internal Medicine

## 2020-08-28 ENCOUNTER — Other Ambulatory Visit: Payer: Self-pay | Admitting: Internal Medicine

## 2020-08-29 DIAGNOSIS — G4733 Obstructive sleep apnea (adult) (pediatric): Secondary | ICD-10-CM | POA: Diagnosis not present

## 2020-09-17 ENCOUNTER — Other Ambulatory Visit: Payer: Self-pay

## 2020-09-17 ENCOUNTER — Other Ambulatory Visit (INDEPENDENT_AMBULATORY_CARE_PROVIDER_SITE_OTHER): Payer: PPO

## 2020-09-17 DIAGNOSIS — R413 Other amnesia: Secondary | ICD-10-CM | POA: Diagnosis not present

## 2020-09-17 DIAGNOSIS — E785 Hyperlipidemia, unspecified: Secondary | ICD-10-CM

## 2020-09-17 DIAGNOSIS — I10 Essential (primary) hypertension: Secondary | ICD-10-CM | POA: Diagnosis not present

## 2020-09-17 LAB — BASIC METABOLIC PANEL
BUN: 16 mg/dL (ref 6–23)
CO2: 27 mEq/L (ref 19–32)
Calcium: 9.2 mg/dL (ref 8.4–10.5)
Chloride: 105 mEq/L (ref 96–112)
Creatinine, Ser: 0.96 mg/dL (ref 0.40–1.50)
GFR: 74.89 mL/min (ref >60.00)
Glucose, Bld: 99 mg/dL (ref 70–99)
Potassium: 4.2 mEq/L (ref 3.5–5.1)
Sodium: 141 mEq/L (ref 135–145)

## 2020-09-17 LAB — CBC WITH DIFFERENTIAL/PLATELET
Basophils Absolute: 0 10*3/uL (ref 0.0–0.1)
Basophils Relative: 0.7 % (ref 0.0–3.0)
Eosinophils Absolute: 0.2 10*3/uL (ref 0.0–0.7)
Eosinophils Relative: 3.4 % (ref 0.0–5.0)
HCT: 40.4 % (ref 39.0–52.0)
Hemoglobin: 13.6 g/dL (ref 13.0–17.0)
Lymphocytes Relative: 32.8 % (ref 12.0–46.0)
Lymphs Abs: 1.9 10*3/uL (ref 0.7–4.0)
MCHC: 33.7 g/dL (ref 30.0–36.0)
MCV: 93.7 fl (ref 78.0–100.0)
Monocytes Absolute: 0.7 10*3/uL (ref 0.1–1.0)
Monocytes Relative: 11.4 % (ref 3.0–12.0)
Neutro Abs: 3 10*3/uL (ref 1.4–7.7)
Neutrophils Relative %: 51.7 % (ref 43.0–77.0)
Platelets: 207 10*3/uL (ref 150.0–400.0)
RBC: 4.31 Mil/uL (ref 4.22–5.81)
RDW: 13.5 % (ref 11.5–15.5)
WBC: 5.8 10*3/uL (ref 4.0–10.5)

## 2020-09-17 LAB — LIPID PANEL
Cholesterol: 141 mg/dL (ref 0–200)
HDL: 54.6 mg/dL (ref >39.00)
LDL Cholesterol: 53 mg/dL (ref 0–99)
NonHDL: 85.98
Total CHOL/HDL Ratio: 3
Triglycerides: 164 mg/dL — ABNORMAL HIGH (ref 0.0–149.0)
VLDL: 32.8 mg/dL (ref 0.0–40.0)

## 2020-09-17 LAB — HEPATIC FUNCTION PANEL
ALT: 15 U/L (ref 0–53)
AST: 17 U/L (ref 0–37)
Albumin: 4.4 g/dL (ref 3.5–5.2)
Alkaline Phosphatase: 75 U/L (ref 39–117)
Bilirubin, Direct: 0.2 mg/dL (ref 0.0–0.3)
Total Bilirubin: 1 mg/dL (ref 0.2–1.2)
Total Protein: 6.7 g/dL (ref 6.0–8.3)

## 2020-09-17 LAB — TSH: TSH: 3.08 u[IU]/mL (ref 0.35–4.50)

## 2020-09-17 LAB — VITAMIN B12: Vitamin B-12: 767 pg/mL (ref 211–911)

## 2020-09-19 ENCOUNTER — Encounter: Payer: Self-pay | Admitting: Internal Medicine

## 2020-09-19 ENCOUNTER — Other Ambulatory Visit: Payer: Self-pay

## 2020-09-19 ENCOUNTER — Ambulatory Visit (INDEPENDENT_AMBULATORY_CARE_PROVIDER_SITE_OTHER): Payer: PPO | Admitting: Internal Medicine

## 2020-09-19 VITALS — BP 136/70 | HR 84 | Temp 98.6°F | Resp 16 | Ht 73.0 in | Wt 239.0 lb

## 2020-09-19 DIAGNOSIS — G4733 Obstructive sleep apnea (adult) (pediatric): Secondary | ICD-10-CM | POA: Diagnosis not present

## 2020-09-19 DIAGNOSIS — I10 Essential (primary) hypertension: Secondary | ICD-10-CM

## 2020-09-19 DIAGNOSIS — R202 Paresthesia of skin: Secondary | ICD-10-CM | POA: Diagnosis not present

## 2020-09-19 DIAGNOSIS — Z23 Encounter for immunization: Secondary | ICD-10-CM | POA: Diagnosis not present

## 2020-09-19 DIAGNOSIS — E785 Hyperlipidemia, unspecified: Secondary | ICD-10-CM

## 2020-09-19 DIAGNOSIS — Z9989 Dependence on other enabling machines and devices: Secondary | ICD-10-CM | POA: Diagnosis not present

## 2020-09-19 NOTE — Patient Instructions (Signed)
Cock up wrist splint (right and left)

## 2020-09-19 NOTE — Progress Notes (Signed)
Patient ID: Brian Pearson, male   DOB: 11/05/1936, 84 y.o.   MRN: [id number]   Subjective:    Patient ID: Brian Pearson, male    DOB: 1936-05-31, 84 y.o.   MRN: [id number]  HPI This visit occurred during the SARS-CoV-2 public health emergency.  Safety protocols were in place, including screening questions prior to the visit, additional usage of staff PPE, and extensive cleaning of exam room while observing appropriate contact time as indicated for disinfecting solutions.  Patient here for a scheduled follow up.  Here to follow up regarding his blood pressure.  He reports he is doing relatively well.  No chest pain or sob reported.  No abdominal pain or bowel change reported.  Does report tingling hand - right > left. No weakness.  No increased pain.  Blood pressures doing ok.    Past Medical History:  Diagnosis Date  . Arthritis   . BPH with obstruction/lower urinary tract symptoms   . GERD (gastroesophageal reflux disease)   . Hyperlipidemia    taken off simvastatin last year  . Hypertension   . Obesity   . Pneumonia    hospitalized   . Urinary frequency   . Urine incontinence    Past Surgical History:  Procedure Laterality Date  . COLONOSCOPY WITH PROPOFOL N/A 02/09/2016   Procedure: COLONOSCOPY WITH PROPOFOL;  Surgeon: Hulen Luster, MD;  Location: [facility] ENDOSCOPY;  Service: Gastroenterology;  Laterality: N/A;  . ESOPHAGOGASTRODUODENOSCOPY (EGD) WITH PROPOFOL N/A 02/09/2016   Procedure: ESOPHAGOGASTRODUODENOSCOPY (EGD) WITH PROPOFOL;  Surgeon: Hulen Luster, MD;  Location: [facility] ENDOSCOPY;  Service: Gastroenterology;  Laterality: N/A;  . HERNIA REPAIR     1990's - 3 surgeries   Family History  Problem Relation Age of Onset  . Arthritis Mother   . Heart disease Mother        CHF  . Cancer Father        Prostate  . Dementia Father   . Alzheimer's disease Father   . Hypotension Sister   . Kidney cancer Neg Hx   . Bladder Cancer Neg Hx    Social History   Socioeconomic History    . Marital status: Married    Spouse name: Not on file  . Number of children: Not on file  . Years of education: Not on file  . Highest education level: Not on file  Occupational History  . Not on file  Tobacco Use  . Smoking status: Never Smoker  . Smokeless tobacco: Never Used  Vaping Use  . Vaping Use: Never used  Substance and Sexual Activity  . Alcohol use: Yes    Alcohol/week: 0.0 standard drinks    Comment: OCC glass of wine  . Drug use: No  . Sexual activity: Yes  Other Topics Concern  . Not on file  Social History Narrative   Lives in [place].      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 Difficulty of Paying Living Expenses: Not on [place]:   . Worried About Charity fundraiser in the Last Year: Not on file  . Ran Out of Food in the Last Year: Not on file  Transportation Needs:   . Film/video editor (Medical): Not on file  . Lack of Transportation (Non-Medical): Not on file  Physical Activity:   . Days of Exercise per Week: Not on file  . Minutes of Exercise per Session: Not on file  Stress:   . Feeling of Stress : Not on file  Social Connections:   . Frequency of Communication with Friends and Family: Not on file  . Frequency of Social Gatherings with Friends and Family: Not on file  . Attends Religious Services: Not on file  . Active Member of Clubs or Organizations: Not on file  . Attends Archivist Meetings: Not on file  . Marital Status: Not on file    Outpatient Encounter Medications as of 09/19/2020  Medication Sig  . amLODipine (NORVASC) 10 MG tablet Take 1 tablet by mouth once daily  . aspirin EC 81 MG tablet Take by mouth.  . Cholecalciferol (VITAMIN D-3) 1000 units CAPS Take 1 capsule by mouth.  . Coenzyme Q10  (COQ10 PO) Take 1 capsule by mouth daily.  . fesoterodine (TOVIAZ) 4 MG TB24 tablet Take 1 tablet (4 mg total) by mouth daily.  . finasteride (PROSCAR) 5 MG tablet Take 1 tablet (5 mg total) by mouth daily.  Marland Kitchen losartan (COZAAR) 25 MG tablet Take 1 tablet by mouth once daily  . Multiple Vitamin (MULTI-VITAMINS) TABS Take by mouth.  . Omega-3 Fatty Acids (FISH OIL PO) Take by mouth.  Marland Kitchen omeprazole (PRILOSEC) 20 MG capsule Take 1 capsule by mouth once daily  . simvastatin (ZOCOR) 20 MG tablet TAKE 1 TABLET BY MOUTH AT BEDTIME. NEED APPT.  . tadalafil (CIALIS) 20 MG tablet Take 1 tablet (20 mg total) by mouth daily as needed for erectile dysfunction.  . tamsulosin (FLOMAX) 0.4 MG CAPS capsule Take 1 capsule (0.4 mg total) by mouth daily.   No facility-administered encounter medications on file as of 09/19/2020.    Review of Systems  Constitutional: Negative for appetite change and unexpected weight change.  HENT: Negative for congestion and sinus pressure.   Respiratory: Negative for cough, chest tightness and shortness of breath.   Cardiovascular: Negative for chest pain and palpitations.  Gastrointestinal: Negative for abdominal pain, diarrhea, nausea and vomiting.  Genitourinary: Negative for difficulty urinating and dysuria.  Musculoskeletal: Negative for joint swelling and myalgias.  Skin: Negative for color change and rash.  Neurological: Negative for dizziness, light-headedness and headaches.       Tingling - right > left hand  Psychiatric/Behavioral: Negative for agitation and dysphoric mood.       Objective:    Physical Exam Vitals reviewed.  Constitutional:      General: He is not in acute distress.    Appearance: Normal appearance. He is well-developed.  HENT:     Head: Normocephalic and atraumatic.     Right Ear: External ear normal.     Left Ear: External ear normal.  Eyes:     General: No scleral icterus.       Right eye: No discharge.        Left eye: No discharge.      Conjunctiva/sclera: Conjunctivae normal.  Cardiovascular:     Rate and Rhythm: Normal rate and regular rhythm.  Pulmonary:     Effort: Pulmonary effort is normal. No respiratory distress.     Breath sounds: Normal breath sounds.  Abdominal:     General: Bowel sounds are normal.     Palpations: Abdomen is soft.  Tenderness: There is no abdominal tenderness.  Musculoskeletal:        General: No swelling or tenderness.     Cervical back: Neck supple. No tenderness.  Lymphadenopathy:     Cervical: No cervical adenopathy.  Skin:    Findings: No erythema or rash.  Neurological:     Mental Status: He is alert.  Psychiatric:        Mood and Affect: Mood normal.        Behavior: Behavior normal.     BP 136/70   Pulse 84   Temp 98.6 F (37 C) (Oral)   Resp 16   Ht 6\' 1"  (1.854 m)   Wt 239 lb (108.4 kg)   SpO2 98%   BMI 31.53 kg/m  Wt Readings from Last 3 Encounters:  09/23/20 239 lb (108.4 kg)  09/19/20 239 lb (108.4 kg)  06/17/20 240 lb (108.9 kg)     Lab Results  Component Value Date   WBC 5.8 09/17/2020   HGB 13.6 09/17/2020   HCT 40.4 09/17/2020   PLT 207.0 09/17/2020   GLUCOSE 99 09/17/2020   CHOL 141 09/17/2020   TRIG 164.0 (H) 09/17/2020   HDL 54.60 09/17/2020   LDLCALC 53 09/17/2020   ALT 15 09/17/2020   AST 17 09/17/2020   NA 141 09/17/2020   K 4.2 09/17/2020   CL 105 09/17/2020   CREATININE 0.96 09/17/2020   BUN 16 09/17/2020   CO2 27 09/17/2020   TSH 3.08 09/17/2020   PSA 1.08 10/05/2019   MICROALBUR 0.7 10/22/2014    MR Cervical Spine Wo Contrast  Result Date: 09/23/2019 CLINICAL DATA:  Chronic neck pain radiating to left shoulder EXAM: MRI CERVICAL SPINE WITHOUT CONTRAST TECHNIQUE: Multiplanar, multisequence MR imaging of the cervical spine was performed. No intravenous contrast was administered. COMPARISON:  None. FINDINGS: Alignment: Straightening of normal lordosis. Grade 1 retrolisthesis at C4-5. Vertebrae: Mild facet edema at left  C2-3. No fracture. Cord: Normal signal and morphology. Posterior Fossa, vertebral arteries, paraspinal tissues: Negative. Disc levels: C2-3: No disc herniation. Left-greater-than-right facet hypertrophy with severe left neural foraminal stenosis. C3-4: Small central disc protrusion effacing the ventral thecal sac. No spinal canal stenosis. Right-greater-than-left facet hypertrophy. Mild left foraminal stenosis. C4-5: Intermediate sized disc osteophyte complex with severe spinal canal stenosis and severe right foraminal stenosis. Moderate left neural foraminal stenosis. C5-6: Small disc bulge. No spinal canal or neural foraminal stenosis. C6-7: Small central disc protrusion with bilateral uncovertebral hypertrophy. Moderate spinal canal stenosis. No neural foraminal stenosis. C7-T1: Normal. Sagittal imaging of T1-2 and T2-3 is unremarkable. IMPRESSION: 1. Severe spinal canal stenosis and right neural foraminal stenosis at C4-5. 2. Severe left C2-3 neural foraminal stenosis. 3. Moderate spinal canal stenosis at C6-7. Electronically Signed   By: Ulyses Jarred M.D.   On: 09/23/2019 02:30       Assessment & Plan:   Problem List Items Addressed This Visit    OSA on CPAP    Continue cpap.       Hyperlipidemia    On simvastatin.  Low cholesterol diet and exercise.  Follow lipid panel and liver function tests.        Relevant Orders   Hepatic function panel   Lipid panel   Hand tingling    Right > left.  Positive tinels.  Has spinal stenosis as outlined - MRI.   Appears to be c/w CTS.  Discussed cockup wrist splint.  Follow.  If persistent problems, consider NCS.  Essential hypertension    Continue losartan and amlodipine.  Pressure as outlined.  Follow pressures.  Follow metabolic panel.       Relevant Orders   Basic metabolic panel    Other Visit Diagnoses    Need for immunization against influenza    -  Primary   Relevant Orders   Flu Vaccine QUAD High Dose(Fluad) (Completed)        [name], MD

## 2020-09-23 ENCOUNTER — Other Ambulatory Visit: Payer: Self-pay

## 2020-09-23 ENCOUNTER — Ambulatory Visit: Payer: PPO | Admitting: Internal Medicine

## 2020-09-23 ENCOUNTER — Encounter: Payer: Self-pay | Admitting: Internal Medicine

## 2020-09-23 VITALS — BP 150/68 | HR 74 | Temp 98.2°F | Ht 72.0 in | Wt 239.0 lb

## 2020-09-23 DIAGNOSIS — G4733 Obstructive sleep apnea (adult) (pediatric): Secondary | ICD-10-CM

## 2020-09-23 NOTE — Patient Instructions (Addendum)
  A++++ GREAT JOB!! KEEP UP THE GREAT WORK!!!!  Continue AUTOCPAP as prescribed 5-20 cm h20

## 2020-09-23 NOTE — Progress Notes (Signed)
Name: Brian Pearson MRN: [id number] DOB: 1936-05-26     CONSULTATION DATE: 09/23/2020  REFERRING MD :[place] FOR SEVERE SLEEP APNEA Patient diagnosed with severe sleep apnea AHI of 40 178 desaturation episodes 300 total episodes of apnea and hypopnea Excellent compliance report previous office visit 100%   CHIEF COMPLAINT:  Follow-up sleep apnea    HISTORY OF PRESENT ILLNESS: Patient continues to do well with therapy for sleep apnea Patient has excellent compliance report of 100% for the days 100% for greater than 4 hours AHI is significantly reduced down to 6.9 Previous AHI was 40  Overall patient feels better Excessive daytime sleepiness has resolved Has a refreshed sleep  No exacerbation at this time No evidence of heart failure at this time No evidence or signs of infection at this time No respiratory distress No fevers, chills, nausea, vomiting, diarrhea No evidence of lower extremity edema No evidence hemoptysis\    Patient is seen today for problems and issues with sleep related to excessive daytime sleepiness Patient  has been having sleep problems for many years Patient has been having excessive daytime sleepiness for a long time Patient has been having extreme fatigue and tiredness, lack of energy +  very Loud snoring every night + struggling breathe at night and gasps for air   Discussed sleep data and reviewed with patient.  Encouraged proper weight management.  Discussed driving precautions and its relationship with hypersomnolence.  Discussed operating dangerous equipment and its relationship with hypersomnolence.  Discussed sleep hygiene, and benefits of a fixed sleep waked time.  The importance of getting eight or more hours of sleep discussed with patient.  Discussed limiting the use of the computer and television before bedtime.  Decrease naps during the day, so night time sleep will become enhanced.    Limit caffeine, and sleep deprivation.  HTN, stroke, and heart failure are potential risk factors.    PAST MEDICAL HISTORY :   has a past medical history of Arthritis, BPH with obstruction/lower urinary tract symptoms, GERD (gastroesophageal reflux disease), Hyperlipidemia, Hypertension, Obesity, Pneumonia, Urinary frequency, and Urine incontinence.  has a past surgical history that includes Hernia repair; Colonoscopy with propofol (N/A, 02/09/2016); and Esophagogastroduodenoscopy (egd) with propofol (N/A, 02/09/2016). Prior to Admission medications   Medication Sig Start Date End Date Taking? Authorizing Provider  amLODipine (NORVASC) 2.5 MG tablet TAKE 1 TABLET BY MOUTH ONCE DAILY 12/18/18   [name], MD  aspirin EC 81 MG tablet Take by mouth.    [provider]  Cholecalciferol (VITAMIN D-3) 1000 units CAPS Take 1 capsule by mouth.    [provider]  Coenzyme Q10 (COQ10 PO) Take 1 capsule by mouth daily.    [provider]  fesoterodine (TOVIAZ) 4 MG TB24 tablet Take 1 tablet (4 mg total) by mouth daily. 05/22/18   [name], PA-C  finasteride (PROSCAR) 5 MG tablet Take 1 tablet (5 mg total) by mouth daily. 05/01/19   [name], PA-C  Multiple Vitamin (MULTI-VITAMINS) TABS Take by mouth.    [provider]  Omega-3 Fatty Acids (FISH OIL PO) Take by mouth.    [provider]  omeprazole (PRILOSEC) 20 MG capsule TAKE 1 CAPSULE BY MOUTH ONCE DAILY 11/14/18   [name], MD  simvastatin (ZOCOR) 20 MG tablet TAKE 1 TABLET BY MOUTH AT BEDTIME NEEDS  APPOINTMENT  FOR  ADDITIONAL  OR  90  DAY  REFILLS 04/04/19   Nicki Reaper,  Charlene, MD  tamsulosin (FLOMAX) 0.4 MG CAPS capsule Take 1 capsule (0.4 mg total) by mouth daily. 05/01/19   [name], PA-C   No Known Allergies SOCIAL HISTORY:  reports that he has never smoked. He has never used smokeless tobacco. He reports current alcohol use. He reports that he does not use  drugs.   Review of Systems:  Gen:  Denies  fever, sweats, chills weight loss  HEENT: Denies blurred vision, double vision, ear pain, eye pain, hearing loss, nose bleeds, sore throat Cardiac:  No dizziness, chest pain or heaviness, chest tightness,edema, No JVD Resp:   No cough, -sputum production, -shortness of breath,-wheezing, -hemoptysis,  Other:  All other systems negative   BP (!) 150/68 (BP Location: Left Arm, Patient Position: Sitting, Cuff Size: Normal)   Pulse 74   Temp 98.2 F (36.8 C) (Temporal)   Ht 6' (1.829 m)   Wt 239 lb (108.4 kg)   SpO2 96%   BMI 32.41 kg/m   Physical Examination:   General Appearance: No distress  Neuro:without focal findings,  speech normal,  HEENT: PERRLA, EOM intact.   Pulmonary: normal breath sounds, No wheezing.  CardiovascularNormal S1,S2.  No m/r/g.   Abdomen: Benign, Soft, non-tender. Renal:  No costovertebral tenderness  GU:  Not performed at this time. Endoc: No evident thyromegaly Skin:   warm, no rashes, no ecchymosis  Extremities: normal, no cyanosis, clubbing. PSYCHIATRIC: Mood, affect within normal limits.   ALL OTHER ROS ARE NEGATIVE      ASSESSMENT AND PLAN SYNOPSIS Patient with underlying severe sleep apnea Patient uses and benefits from auto CPAP therapy Patient's AHI has significantly improved  patient with excellent compliance report Symptoms have significantly improved   Compliance report reviewed with patient Patient with excellent compliance report  Hypertension Sleep apnea can contribute to hypertension therefore treatment of sleep apnea is important part of hypertension management   COVID-19 EDUCATION: The signs and symptoms of COVID-19 were discussed with the patient and how to seek care for testing.  The importance of social distancing was discussed today. Hand Washing Techniques and avoid touching face was advised.     MEDICATION ADJUSTMENTS/LABS AND TESTS ORDERED: Continue AUTOCPAP as  prescribed 5-20 cm h20   CURRENT MEDICATIONS REVIEWED AT LENGTH WITH PATIENT TODAY   Patient satisfied with Plan of action and management. All questions answered  Follow up in 1 year  TOTAL TIME SPENT 21 mins    Niaja Stickley Patricia Pesa, M.D.  [name]  Medical Director [place] Director [facility] Cardio-Pulmonary Department

## 2020-09-27 ENCOUNTER — Encounter: Payer: Self-pay | Admitting: Internal Medicine

## 2020-09-27 ENCOUNTER — Other Ambulatory Visit: Payer: Self-pay | Admitting: Internal Medicine

## 2020-09-27 DIAGNOSIS — R202 Paresthesia of skin: Secondary | ICD-10-CM | POA: Insufficient documentation

## 2020-09-27 NOTE — Assessment & Plan Note (Signed)
Continue losartan and amlodipine.  Pressure as outlined.   Follow pressures.  Follow metabolic panel.  

## 2020-09-27 NOTE — Assessment & Plan Note (Addendum)
Right > left.  Positive tinels.  Has spinal stenosis as outlined - MRI.   Appears to be c/w CTS.  Discussed cockup wrist splint.  Follow.  If persistent problems, consider NCS.

## 2020-09-27 NOTE — Assessment & Plan Note (Signed)
Continue cpap.  

## 2020-09-27 NOTE — Assessment & Plan Note (Signed)
On simvastatin.  Low cholesterol diet and exercise.  Follow lipid panel and liver function tests.   

## 2020-10-07 ENCOUNTER — Ambulatory Visit (INDEPENDENT_AMBULATORY_CARE_PROVIDER_SITE_OTHER): Payer: PPO

## 2020-10-07 VITALS — Ht 72.0 in | Wt 239.0 lb

## 2020-10-07 DIAGNOSIS — Z0000 Encounter for general adult medical examination without abnormal findings: Secondary | ICD-10-CM

## 2020-10-07 NOTE — Patient Instructions (Addendum)
Brian Pearson , Thank you for taking time to come for your Medicare Wellness Visit. I appreciate your ongoing commitment to your health goals. Please review the following plan we discussed and let me know if I can assist you in the future.   These are the goals we discussed: Goals    . Low Carb Diet       This is a list of the screening recommended for you and due dates:  Health Maintenance  Topic Date Due  . Tetanus Vaccine  08/27/2025  . Flu Shot  Completed  . COVID-19 Vaccine  Completed  . Pneumonia vaccines  Completed     Immunizations Immunization History  Administered Date(s) Administered  . Fluad Quad(high Dose 65+) 10/05/2019, 09/19/2020  . Influenza, High Dose Seasonal PF 08/28/2015, 08/30/2016, 08/30/2017, 08/31/2018  . Influenza-Unspecified 09/21/2014  . PFIZER SARS-COV-2 Vaccination 01/17/2020, 02/11/2020  . Pneumococcal Conjugate-13 10/22/2014  . Pneumococcal-Unspecified 10/23/2011  . Tdap 08/28/2015   Keep all routine maintenance appointments.   Next scheduled lab 01/27/21  Cpe 01/29/21 @ 10:00  Advanced directives: on file  Conditions/risks identified: none new  Follow up in one year for your annual wellness visit.   Preventive Care 70 Years and Older, Male Preventive care refers to lifestyle choices and visits with your health care provider that can promote health and wellness. What does preventive care include?  A yearly physical exam. This is also called an annual well check.  Dental exams once or twice a year.  Routine eye exams. Ask your health care provider how often you should have your eyes checked.  Personal lifestyle choices, including:  Daily care of your teeth and gums.  Regular physical activity.  Eating a healthy diet.  Avoiding tobacco and drug use.  Limiting alcohol use.  Practicing safe sex.  Taking low doses of aspirin every day.  Taking vitamin and mineral supplements as recommended by your health care provider. What  happens during an annual well check? The services and screenings done by your health care provider during your annual well check will depend on your age, overall health, lifestyle risk factors, and family history of disease. Counseling  Your health care provider may ask you questions about your:  Alcohol use.  Tobacco use.  Drug use.  Emotional well-being.  Home and relationship well-being.  Sexual activity.  Eating habits.  History of falls.  Memory and ability to understand (cognition).  Work and work Statistician. Screening  You may have the following tests or measurements:  Height, weight, and BMI.  Blood pressure.  Lipid and cholesterol levels. These may be checked every 5 years, or more frequently if you are over 71 years old.  Skin check.  Lung cancer screening. You may have this screening every year starting at age 26 if you have a 30-pack-year history of smoking and currently smoke or have quit within the past 15 years.  Fecal occult blood test (FOBT) of the stool. You may have this test every year starting at age 21.  Flexible sigmoidoscopy or colonoscopy. You may have a sigmoidoscopy every 5 years or a colonoscopy every 10 years starting at age 24.  Prostate cancer screening. Recommendations will vary depending on your family history and other risks.  Hepatitis C blood test.  Hepatitis B blood test.  Sexually transmitted disease (STD) testing.  Diabetes screening. This is done by checking your blood sugar (glucose) after you have not eaten for a while (fasting). You may have this done every 1-3 years.  Abdominal aortic aneurysm (AAA) screening. You may need this if you are a current or former smoker.  Osteoporosis. You may be screened starting at age 22 if you are at high risk. Talk with your health care provider about your test results, treatment options, and if necessary, the need for more tests. Vaccines  Your health care provider may recommend  certain vaccines, such as:  Influenza vaccine. This is recommended every year.  Tetanus, diphtheria, and acellular pertussis (Tdap, Td) vaccine. You may need a Td booster every 10 years.  Zoster vaccine. You may need this after age 77.  Pneumococcal 13-valent conjugate (PCV13) vaccine. One dose is recommended after age 62.  Pneumococcal polysaccharide (PPSV23) vaccine. One dose is recommended after age 54. Talk to your health care provider about which screenings and vaccines you need and how often you need them. This information is not intended to replace advice given to you by your health care provider. Make sure you discuss any questions you have with your health care provider. Document Released: 01/02/2016 Document Revised: 08/25/2016 Document Reviewed: 10/07/2015 Elsevier Interactive Patient Education  2017 [place] Prevention in the Home Falls can cause injuries. They can happen to people of all ages. There are many things you can do to make your home safe and to help prevent falls. What can I do on the outside of my home?  Regularly fix the edges of walkways and driveways and fix any cracks.  Remove anything that might make you trip as you walk through a door, such as a raised step or threshold.  Trim any bushes or trees on the path to your home.  Use bright outdoor lighting.  Clear any walking paths of anything that might make someone trip, such as rocks or tools.  Regularly check to see if handrails are loose or broken. Make sure that both sides of any steps have handrails.  Any raised decks and porches should have guardrails on the edges.  Have any leaves, snow, or ice cleared regularly.  Use sand or salt on walking paths during winter.  Clean up any spills in your garage right away. This includes oil or grease spills. What can I do in the bathroom?  Use night lights.  Install grab bars by the toilet and in the tub and shower. Do not use towel bars as  grab bars.  Use non-skid mats or decals in the tub or shower.  If you need to sit down in the shower, use a plastic, non-slip stool.  Keep the floor dry. Clean up any water that spills on the floor as soon as it happens.  Remove soap buildup in the tub or shower regularly.  Attach bath mats securely with double-sided non-slip rug tape.  Do not have throw rugs and other things on the floor that can make you trip. What can I do in the bedroom?  Use night lights.  Make sure that you have a light by your bed that is easy to reach.  Do not use any sheets or blankets that are too big for your bed. They should not hang down onto the floor.  Have a firm chair that has side arms. You can use this for support while you get dressed.  Do not have throw rugs and other things on the floor that can make you trip. What can I do in the kitchen?  Clean up any spills right away.  Avoid walking on wet floors.  Keep items that you use  a lot in easy-to-reach places.  If you need to reach something above you, use a strong step stool that has a grab bar.  Keep electrical cords out of the way.  Do not use floor polish or wax that makes floors slippery. If you must use wax, use non-skid floor wax.  Do not have throw rugs and other things on the floor that can make you trip. What can I do with my stairs?  Do not leave any items on the stairs.  Make sure that there are handrails on both sides of the stairs and use them. Fix handrails that are broken or loose. Make sure that handrails are as long as the stairways.  Check any carpeting to make sure that it is firmly attached to the stairs. Fix any carpet that is loose or worn.  Avoid having throw rugs at the top or bottom of the stairs. If you do have throw rugs, attach them to the floor with carpet tape.  Make sure that you have a light switch at the top of the stairs and the bottom of the stairs. If you do not have them, ask someone to add them  for you. What else can I do to help prevent falls?  Wear shoes that:  Do not have high heels.  Have rubber bottoms.  Are comfortable and fit you well.  Are closed at the toe. Do not wear sandals.  If you use a stepladder:  Make sure that it is fully opened. Do not climb a closed stepladder.  Make sure that both sides of the stepladder are locked into place.  Ask someone to hold it for you, if possible.  Clearly mark and make sure that you can see:  Any grab bars or handrails.  First and last steps.  Where the edge of each step is.  Use tools that help you move around (mobility aids) if they are needed. These include:  Canes.  Walkers.  Scooters.  Crutches.  Turn on the lights when you go into a dark area. Replace any light bulbs as soon as they burn out.  Set up your furniture so you have a clear path. Avoid moving your furniture around.  If any of your floors are uneven, fix them.  If there are any pets around you, be aware of where they are.  Review your medicines with your doctor. Some medicines can make you feel dizzy. This can increase your chance of falling. Ask your doctor what other things that you can do to help prevent falls. This information is not intended to replace advice given to you by your health care provider. Make sure you discuss any questions you have with your health care provider. Document Released: 10/02/2009 Document Revised: 05/13/2016 Document Reviewed: 01/10/2015 Elsevier Interactive Patient Education  2017 [employer].

## 2020-10-07 NOTE — Progress Notes (Addendum)
Subjective:   Brian Pearson is a 84 y.o. male who presents for Medicare Annual/Subsequent preventive examination.  Review of Systems    No ROS.  Medicare Wellness Virtual Visit.   Cardiac Risk Factors include: advanced age (>71men, >65 women);male gender;hypertension     Objective:    Today's Vitals   10/07/20 0904  Weight: 239 lb (108.4 kg)  Height: 6' (1.829 m)   Body mass index is 32.41 kg/m.  Advanced Directives 10/05/2019 08/31/2018 08/30/2017 08/30/2016 08/28/2015  Does Patient Have a Medical Advance Directive? Yes Yes Yes Yes Yes  Type of Paramedic of [place];Living will Living will;Healthcare Power of [place];Living will [place];Living will [place];Living will  Does patient want to make changes to medical advance directive? No - Patient declined No - Patient declined No - Patient declined - No - Patient declined  Copy of [place] in Chart? Yes - validated most recent copy scanned in chart (See row information) Yes Yes Yes No - copy requested    Current Medications (verified) Outpatient Encounter Medications as of 10/07/2020  Medication Sig  . amLODipine (NORVASC) 10 MG tablet Take 1 tablet by mouth once daily  . aspirin EC 81 MG tablet Take by mouth.  . Cholecalciferol (VITAMIN D-3) 1000 units CAPS Take 1 capsule by mouth.  . Coenzyme Q10 (COQ10 PO) Take 1 capsule by mouth daily.  . fesoterodine (TOVIAZ) 4 MG TB24 tablet Take 1 tablet (4 mg total) by mouth daily.  . finasteride (PROSCAR) 5 MG tablet Take 1 tablet (5 mg total) by mouth daily.  Marland Kitchen losartan (COZAAR) 25 MG tablet Take 1 tablet by mouth once daily  . Multiple Vitamin (MULTI-VITAMINS) TABS Take by mouth.  . Omega-3 Fatty Acids (FISH OIL PO) Take by mouth.  Marland Kitchen omeprazole (PRILOSEC) 20 MG capsule Take 1 capsule by mouth once daily  . simvastatin (ZOCOR) 20 MG tablet TAKE 1 TABLET BY MOUTH AT  BEDTIME. NEED APPT.  . tadalafil (CIALIS) 20 MG tablet Take 1 tablet (20 mg total) by mouth daily as needed for erectile dysfunction.  . tamsulosin (FLOMAX) 0.4 MG CAPS capsule Take 1 capsule (0.4 mg total) by mouth daily.   No facility-administered encounter medications on file as of 10/07/2020.    Allergies (verified) Patient has no known allergies.   History: Past Medical History:  Diagnosis Date  . Arthritis   . BPH with obstruction/lower urinary tract symptoms   . GERD (gastroesophageal reflux disease)   . Hyperlipidemia    taken off simvastatin last year  . Hypertension   . Obesity   . Pneumonia    hospitalized   . Urinary frequency   . Urine incontinence    Past Surgical History:  Procedure Laterality Date  . COLONOSCOPY WITH PROPOFOL N/A 02/09/2016   Procedure: COLONOSCOPY WITH PROPOFOL;  Surgeon: Hulen Luster, MD;  Location[facility] ENDOSCOPY;  Service: Gastroenterology;  Laterality: N/A;  . ESOPHAGOGASTRODUODENOSCOPY (EGD) WITH PROPOFOL N/A 02/09/2016   Procedure: ESOPHAGOGASTRODUODENOSCOPY (EGD) WITH PROPOFOL;  Surgeon: Hulen Luster, MD;  Location: [facility] ENDOSCOPY;  Service: Gastroenterology;  Laterality: N/A;  . HERNIA REPAIR     1990's - 3 surgeries   Family History  Problem Relation Age of Onset  . Arthritis Mother   . Heart disease Mother        CHF  . Cancer Father        Prostate  . Dementia Father   .  Alzheimer's disease Father   . Hypotension Sister   . Kidney cancer Neg Hx   . Bladder Cancer Neg Hx    Social History   Socioeconomic History  . Marital status: Married    Spouse name: Not on file  . Number of children: Not on file  . Years of education: Not on file  . Highest education level: Not on file  Occupational History  . Not on file  Tobacco Use  . Smoking status: Never Smoker  . Smokeless tobacco: Never Used  Vaping Use  . Vaping Use: Never used  Substance and Sexual Activity  . Alcohol use: Yes    Alcohol/week: 0.0 standard drinks     Comment: OCC glass of wine  . Drug use: No  . Sexual activity: Yes  Other Topics Concern  . Not on file  Social History Narrative   Lives in [place].      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 Difficulty of Paying Living Expenses: Not on [place]:   . Worried About Charity fundraiser in the Last Year: Not on file  . Ran Out of Food in the Last Year: Not on file  Transportation Needs:   . Lack of Transportation (Medical): Not on file  . Lack of Transportation (Non-Medical): Not on file  Physical Activity:   . Days of Exercise per Week: Not on file  . Minutes of Exercise per Session: Not on file  Stress:   . Feeling of Stress : Not on file  Social Connections:   . Frequency of Communication with Friends and Family: Not on file  . Frequency of Social Gatherings with Friends and Family: Not on file  . Attends Religious Services: Not on file  . Active Member of Clubs or Organizations: Not on file  . Attends Archivist Meetings: Not on file  . Marital Status: Not on file    Tobacco Counseling Counseling given: Not Answered   Clinical Intake:  Pre-visit preparation completed: Yes        Diabetes: No  How often do you need to have someone help you when you read instructions, pamphlets, or other written materials from your doctor or pharmacy?: 1 - Never  Interpreter Needed?: No      Activities of Daily Living In your present state of health, do you have any difficulty performing the following activities: 10/07/2020  Hearing? Y  Comment Hearing aids  Vision? N  Difficulty concentrating or making decisions? N  Walking or climbing stairs? Y  Comment Chronic knee pain; injections R knee 7 months ago, L knee 2 months ago  Dressing or  bathing? N  Doing errands, shopping? N  Preparing Food and eating ? N  Using the Toilet? N  In the past six months, have you accidently leaked urine? N  Do you have problems with loss of bowel control? N  Managing your Medications? N  Managing your Finances? N  Housekeeping or managing your Housekeeping? N  Some recent data might be hidden    Patient Care Team: [name], MD as PCP - General (Internal Medicine)  Indicate any recent Medical Services you may have  received from other than [facility] providers in the past year (date may be approximate).     Assessment:   This is a routine wellness examination for Cast[place].  I connected with Mahari today by telephone and verified that I am speaking with the correct person using two identifiers. Location patient: home Location provider: work Persons participating in the virtual visit: patient, Marine scientist.    I discussed the limitations, risks, security and privacy concerns of performing an evaluation and management service by telephone and the availability of in person appointments. The patient expressed understanding and verbally consented to this telephonic visit.    Interactive audio and video telecommunications were attempted between this provider and patient, however failed, due to patient having technical difficulties OR patient did not have access to video capability.  We continued and completed visit with audio only.  Some vital signs may be absent or patient reported.   Hearing/Vision screen  Hearing Screening   125Hz  250Hz  500Hz  1000Hz  2000Hz  3000Hz  4000Hz  6000Hz  8000Hz   Right ear:           Left ear:           Comments: Hearing aid  Vision Screening Comments: Visual acuity not assessed, virtual visit. They have seen their ophthalmologist in the last 12 months  Dietary issues and exercise activities discussed: Current Exercise Habits: Home exercise routine, Type of exercise: calisthenics;stretching;strength training/weights  (chair exercises), Time (Minutes): 10, Frequency (Times/Week): 5, Weekly Exercise (Minutes/Week): 50, Intensity: Mild  Healthy diet Good water intake  Goals    . Low Carb Diet      Depression Screen PHQ 2/9 Scores 10/07/2020 10/05/2019 08/31/2018 08/30/2017 07/19/2017 10/06/2016 08/30/2016  PHQ - 2 Score 0 0 0 0 0 0 0  PHQ- 9 Score - - - 0 0 - -    Fall Risk Fall Risk  10/07/2020 10/05/2019 08/31/2018 08/30/2017 10/06/2016  Falls in the past year? 0 0 No No No  Number falls in past yr: 0 - - - -  Follow up Falls evaluation completed - - - -   Handrails in use when climbing stairs? Yes Home free of loose throw rugs in walkways, pet beds, electrical cords, etc? Yes  Adequate lighting in your home to reduce risk of falls? Yes   ASSISTIVE DEVICES UTILIZED TO PREVENT FALLS: Life alert? No  Use of a cane, walker or w/c? No  Grab bars in the bathroom? Yes  Shower chair or bench in shower? No  Elevated toilet seat or a handicapped toilet? Yes   TIMED UP AND GO: Was the test performed? No . Virtual visit.   Cognitive Function: MMSE - Mini Mental State Exam 08/30/2016 08/28/2015  Orientation to time 5 5  Orientation to Place 5 5  Registration 3 3  Attention/ Calculation 5 5  Recall 3 3  Language- name 2 objects 2 2  Language- repeat 1 1  Language- follow 3 step command 3 3  Language- read & follow direction 1 1  Write a sentence 1 1  Copy design 1 1  Total score 30 30     6CIT Screen 10/07/2020 10/05/2019 08/31/2018 08/30/2017  What Year? 0 points 0 points 0 points 0 points  What month? 0 points 0 points 0 points 0 points  What time? - 0 points 0 points 0 points  Count back from 20 - 0 points 0 points 0 points  Months in reverse 0 points 0 points 2 points 0 points  Repeat phrase 0 points - 0  points 0 points  Total Score - - 2 0    Immunizations Immunization History  Administered Date(s) Administered  . Fluad Quad(high Dose 65+) 10/05/2019, 09/19/2020  . Influenza, High Dose  Seasonal PF 08/28/2015, 08/30/2016, 08/30/2017, 08/31/2018  . Influenza-Unspecified 09/21/2014  . PFIZER SARS-COV-2 Vaccination 01/17/2020, 02/11/2020  . Pneumococcal Conjugate-13 10/22/2014  . Pneumococcal-Unspecified 10/23/2011  . Tdap 08/28/2015    Health Maintenance There are no preventive care reminders to display for this patient. Health Maintenance  Topic Date Due  . TETANUS/TDAP  08/27/2025  . INFLUENZA VACCINE  Completed  . COVID-19 Vaccine  Completed  . PNA vac Low Risk Adult  Completed   Dental Screening: Recommended annual dental exams for proper oral hygiene.  Community Resource Referral / Chronic Care Management: CRR required this visit?  No   CCM required this visit?  No      Plan:   Keep all routine maintenance appointments.   Next scheduled lab 01/27/21  Cpe 01/29/21 @ 10:00  I have personally reviewed and noted the following in the patient's chart:   . Medical and social history . Use of alcohol, tobacco or illicit drugs  . Current medications and supplements . Functional ability and status . Nutritional status . Physical activity . Advanced directives . List of other physicians . Hospitalizations, surgeries, and ER visits in previous 12 months . Vitals . Screenings to include cognitive, depression, and falls . Referrals and appointments  In addition, I have reviewed and discussed with patient certain preventive protocols, quality metrics, and best practice recommendations. A written personalized care plan for preventive services as well as general preventive health recommendations were provided to patient via mail.     Varney Biles, LPN   08/02/4817    Reviewed above information.  Agree with assessment and plan.   Dr Nicki Reaper

## 2020-10-13 DIAGNOSIS — M5136 Other intervertebral disc degeneration, lumbar region: Secondary | ICD-10-CM | POA: Diagnosis not present

## 2020-10-25 ENCOUNTER — Other Ambulatory Visit: Payer: Self-pay | Admitting: Internal Medicine

## 2020-11-06 ENCOUNTER — Other Ambulatory Visit: Payer: Self-pay | Admitting: Internal Medicine

## 2020-11-06 DIAGNOSIS — R1310 Dysphagia, unspecified: Secondary | ICD-10-CM

## 2020-11-18 ENCOUNTER — Other Ambulatory Visit: Payer: Self-pay | Admitting: Internal Medicine

## 2020-11-25 DIAGNOSIS — M170 Bilateral primary osteoarthritis of knee: Secondary | ICD-10-CM | POA: Diagnosis not present

## 2020-11-27 ENCOUNTER — Other Ambulatory Visit: Payer: Self-pay | Admitting: Internal Medicine

## 2020-11-27 DIAGNOSIS — G4733 Obstructive sleep apnea (adult) (pediatric): Secondary | ICD-10-CM | POA: Diagnosis not present

## 2020-12-18 ENCOUNTER — Other Ambulatory Visit: Payer: Self-pay | Admitting: Internal Medicine

## 2020-12-18 DIAGNOSIS — E785 Hyperlipidemia, unspecified: Secondary | ICD-10-CM

## 2020-12-27 ENCOUNTER — Other Ambulatory Visit: Payer: Self-pay | Admitting: Internal Medicine

## 2021-01-13 ENCOUNTER — Other Ambulatory Visit: Payer: Self-pay | Admitting: Internal Medicine

## 2021-01-13 DIAGNOSIS — M170 Bilateral primary osteoarthritis of knee: Secondary | ICD-10-CM | POA: Diagnosis not present

## 2021-01-14 DIAGNOSIS — H9190 Unspecified hearing loss, unspecified ear: Secondary | ICD-10-CM | POA: Insufficient documentation

## 2021-01-27 ENCOUNTER — Other Ambulatory Visit: Payer: Self-pay

## 2021-01-27 ENCOUNTER — Other Ambulatory Visit (INDEPENDENT_AMBULATORY_CARE_PROVIDER_SITE_OTHER): Payer: PPO

## 2021-01-27 DIAGNOSIS — I10 Essential (primary) hypertension: Secondary | ICD-10-CM

## 2021-01-27 DIAGNOSIS — E785 Hyperlipidemia, unspecified: Secondary | ICD-10-CM

## 2021-01-27 LAB — HEPATIC FUNCTION PANEL
ALT: 12 U/L (ref 0–53)
AST: 15 U/L (ref 0–37)
Albumin: 4.4 g/dL (ref 3.5–5.2)
Alkaline Phosphatase: 65 U/L (ref 39–117)
Bilirubin, Direct: 0.1 mg/dL (ref 0.0–0.3)
Total Bilirubin: 0.8 mg/dL (ref 0.2–1.2)
Total Protein: 6.5 g/dL (ref 6.0–8.3)

## 2021-01-27 LAB — BASIC METABOLIC PANEL
BUN: 17 mg/dL (ref 6–23)
CO2: 29 mEq/L (ref 19–32)
Calcium: 9.3 mg/dL (ref 8.4–10.5)
Chloride: 105 mEq/L (ref 96–112)
Creatinine, Ser: 0.84 mg/dL (ref 0.40–1.50)
GFR: 81.01 mL/min (ref >60.00)
Glucose, Bld: 90 mg/dL (ref 70–99)
Potassium: 3.9 mEq/L (ref 3.5–5.1)
Sodium: 140 mEq/L (ref 135–145)

## 2021-01-27 LAB — LIPID PANEL
Cholesterol: 147 mg/dL (ref 0–200)
HDL: 50.9 mg/dL (ref >39.00)
LDL Cholesterol: 68 mg/dL (ref 0–99)
NonHDL: 96.22
Total CHOL/HDL Ratio: 3
Triglycerides: 139 mg/dL (ref 0.0–149.0)
VLDL: 27.8 mg/dL (ref 0.0–40.0)

## 2021-01-29 ENCOUNTER — Encounter: Payer: Self-pay | Admitting: Internal Medicine

## 2021-01-29 ENCOUNTER — Ambulatory Visit (INDEPENDENT_AMBULATORY_CARE_PROVIDER_SITE_OTHER): Payer: PPO | Admitting: Internal Medicine

## 2021-01-29 ENCOUNTER — Other Ambulatory Visit: Payer: Self-pay

## 2021-01-29 VITALS — BP 120/70 | HR 79 | Temp 97.4°F | Resp 16 | Ht 73.0 in | Wt 230.0 lb

## 2021-01-29 DIAGNOSIS — M542 Cervicalgia: Secondary | ICD-10-CM | POA: Diagnosis not present

## 2021-01-29 DIAGNOSIS — Z9989 Dependence on other enabling machines and devices: Secondary | ICD-10-CM | POA: Diagnosis not present

## 2021-01-29 DIAGNOSIS — I10 Essential (primary) hypertension: Secondary | ICD-10-CM | POA: Diagnosis not present

## 2021-01-29 DIAGNOSIS — R202 Paresthesia of skin: Secondary | ICD-10-CM | POA: Diagnosis not present

## 2021-01-29 DIAGNOSIS — E785 Hyperlipidemia, unspecified: Secondary | ICD-10-CM | POA: Diagnosis not present

## 2021-01-29 DIAGNOSIS — G4733 Obstructive sleep apnea (adult) (pediatric): Secondary | ICD-10-CM

## 2021-01-29 DIAGNOSIS — Z0000 Encounter for general adult medical examination without abnormal findings: Secondary | ICD-10-CM

## 2021-01-29 DIAGNOSIS — M25561 Pain in right knee: Secondary | ICD-10-CM

## 2021-01-29 DIAGNOSIS — Z125 Encounter for screening for malignant neoplasm of prostate: Secondary | ICD-10-CM

## 2021-01-29 NOTE — Progress Notes (Signed)
Patient ID: Brian Pearson, male   DOB: [birth year], 85 y.o.   MRN: [id number]   Subjective:    Patient ID: Brian Pearson, male    DOB: 25-Aug-1936, 85 y.o.   MRN: [id number]  HPI This visit occurred during the SARS-CoV-2 public health emergency.  Safety protocols were in place, including screening questions prior to the visit, additional usage of staff PPE, and extensive cleaning of exam room while observing appropriate contact time as indicated for disinfecting solutions.  Patient here for his physical exam.  Still having issues with bilateral hand numbness.  Persistent.  Decreased fine motor.  Hard to feel things with tips of his fingers.  Has some neck discomfort at times, but overall neck stable.  Has had MRI c-spine - as outlined.  Stenosis.  No arm  Pain.  No numbness/tingling up the arm.  No chest pain or sob.  No acid reflux or abdominal pain reported.  Bowels moving.  Having knee pain.  Notices when twisting and getting out of car.  Increased pain with standing.  Planning knee surgery when elective surgeries open up.  Using cpap.   Past Medical History:  Diagnosis Date  . Arthritis   . BPH with obstruction/lower urinary tract symptoms   . GERD (gastroesophageal reflux disease)   . Hyperlipidemia    taken off simvastatin last year  . Hypertension   . Obesity   . Pneumonia    hospitalized   . Urinary frequency   . Urine incontinence    Past Surgical History:  Procedure Laterality Date  . COLONOSCOPY WITH PROPOFOL N/A 02/09/2016   Procedure: COLONOSCOPY WITH PROPOFOL;  Surgeon: Hulen Luster, MD;  Location: [facility] ENDOSCOPY;  Service: Gastroenterology;  Laterality: N/A;  . ESOPHAGOGASTRODUODENOSCOPY (EGD) WITH PROPOFOL N/A 02/09/2016   Procedure: ESOPHAGOGASTRODUODENOSCOPY (EGD) WITH PROPOFOL;  Surgeon: Hulen Luster, MD;  Location: [facility] ENDOSCOPY;  Service: Gastroenterology;  Laterality: N/A;  . HERNIA REPAIR     1990's - 3 surgeries   Family History  Problem Relation Age of Onset  .  Arthritis Mother   . Heart disease Mother        CHF  . Cancer Father        Prostate  . Dementia Father   . Alzheimer's disease Father   . Hypotension Sister   . Kidney cancer Neg Hx   . Bladder Cancer Neg Hx    Social History   Socioeconomic History  . Marital status: Married    Spouse name: Not on file  . Number of children: Not on file  . Years of education: Not on file  . Highest education level: Not on file  Occupational History  . Not on file  Tobacco Use  . Smoking status: Never Smoker  . Smokeless tobacco: Never Used  Vaping Use  . Vaping Use: Never used  Substance and Sexual Activity  . Alcohol use: Yes    Alcohol/week: 0.0 standard drinks    Comment: OCC glass of wine  . Drug use: No  . Sexual activity: Yes  Other Topics Concern  . Not on file  Social History Narrative   Lives in [place].      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 amLODipine (NORVASC) 10 MG tablet Take 1 tablet by mouth once daily  . aspirin EC 81 MG tablet Take by mouth.  . Cholecalciferol (VITAMIN D-3) 1000 units CAPS Take 1 capsule by mouth.  . Coenzyme Q10 (COQ10 PO) Take 1 capsule by mouth daily.  . fesoterodine (TOVIAZ) 4 MG TB24 tablet Take 1 tablet (4 mg total) by mouth daily.  . finasteride (PROSCAR) 5 MG tablet Take 1 tablet (5 mg total) by mouth daily.  Marland Kitchen losartan (COZAAR) 25 MG tablet Take 1 tablet by mouth once daily  . Multiple Vitamin (MULTI-VITAMINS) TABS Take by mouth.  . Omega-3 Fatty Acids (FISH OIL PO) Take by mouth.  Marland Kitchen  omeprazole (PRILOSEC) 20 MG capsule Take 1 capsule by mouth once daily  . simvastatin (ZOCOR) 20 MG tablet TAKE 1 TABLET BY MOUTH AT BEDTIME -  NEED  APPT  . tadalafil (CIALIS) 20 MG tablet Take 1 tablet (20 mg total) by mouth daily as needed for erectile dysfunction.  . tamsulosin (FLOMAX) 0.4 MG CAPS capsule Take 1 capsule (0.4 mg total) by mouth daily.   No facility-administered encounter medications on file as of 01/29/2021.   Review of Systems  Constitutional: Negative for appetite change and unexpected weight change.  HENT: Negative for congestion, sinus pressure and sore throat.   Eyes: Negative for pain and visual disturbance.  Respiratory: Negative for cough, chest tightness and shortness of breath.   Cardiovascular: Negative for chest pain, palpitations and leg swelling.  Gastrointestinal: Negative for abdominal pain, diarrhea, nausea and vomiting.  Genitourinary: Negative for difficulty urinating and dysuria.  Musculoskeletal: Negative for joint swelling and myalgias.  Skin: Negative for color change and rash.  Neurological: Negative for dizziness and headaches.       Numbness/tingling - fingers.   Knee pain as outlined.   Hematological: Negative for adenopathy. Does not bruise/bleed easily.  Psychiatric/Behavioral: Negative for agitation and dysphoric mood.       Objective:    Physical Exam Vitals reviewed.  Constitutional:      General: He is not in acute distress.    Appearance: Normal appearance. He is well-developed and well-nourished.  HENT:     Head: Normocephalic and atraumatic.     Right Ear: External ear normal.     Left Ear: External ear normal.  Eyes:     General:        Right eye: No discharge.        Left eye: No discharge.     Conjunctiva/sclera: Conjunctivae normal.  Neck:     Thyroid: No thyromegaly.  Cardiovascular:     Rate and Rhythm: Normal rate and regular rhythm.  Pulmonary:     Effort: No respiratory distress.     Breath sounds: Normal  breath sounds. No wheezing.  Abdominal:     General: Bowel sounds are normal.     Palpations: Abdomen is soft.     Tenderness: There is no abdominal tenderness.  Musculoskeletal:        General: No swelling, tenderness or edema.     Cervical back: Neck supple. No tenderness.     Comments: Grip strength wnl.   Lymphadenopathy:     Cervical: No cervical adenopathy.  Skin:  Findings: No erythema or rash.  Neurological:     Mental Status: He is alert and oriented to person, place, and time.  Psychiatric:        Mood and Affect: Mood and affect and mood normal.        Behavior: Behavior normal.     BP 120/70   Pulse 79   Temp (!) 97.4 F (36.3 C) (Oral)   Resp 16   Ht 6\' 1"  (1.854 m)   Wt 230 lb (104.3 kg)   SpO2 99%   BMI 30.34 kg/m  Wt Readings from Last 3 Encounters:  01/29/21 230 lb (104.3 kg)  10/07/20 239 lb (108.4 kg)  09/23/20 239 lb (108.4 kg)     Lab Results  Component Value Date   WBC 5.8 09/17/2020   HGB 13.6 09/17/2020   HCT 40.4 09/17/2020   PLT 207.0 09/17/2020   GLUCOSE 90 01/27/2021   CHOL 147 01/27/2021   TRIG 139.0 01/27/2021   HDL 50.90 01/27/2021   LDLCALC 68 01/27/2021   ALT 12 01/27/2021   AST 15 01/27/2021   NA 140 01/27/2021   K 3.9 01/27/2021   CL 105 01/27/2021   CREATININE 0.84 01/27/2021   BUN 17 01/27/2021   CO2 29 01/27/2021   TSH 3.08 09/17/2020   PSA 1.08 10/05/2019   MICROALBUR 0.7 10/22/2014    MR Cervical Spine Wo Contrast  Result Date: 09/23/2019 CLINICAL DATA:  Chronic neck pain radiating to left shoulder EXAM: MRI CERVICAL SPINE WITHOUT CONTRAST TECHNIQUE: Multiplanar, multisequence MR imaging of the cervical spine was performed. No intravenous contrast was administered. COMPARISON:  None. FINDINGS: Alignment: Straightening of normal lordosis. Grade 1 retrolisthesis at C4-5. Vertebrae: Mild facet edema at left C2-3. No fracture. Cord: Normal signal and morphology. Posterior Fossa, vertebral arteries, paraspinal  tissues: Negative. Disc levels: C2-3: No disc herniation. Left-greater-than-right facet hypertrophy with severe left neural foraminal stenosis. C3-4: Small central disc protrusion effacing the ventral thecal sac. No spinal canal stenosis. Right-greater-than-left facet hypertrophy. Mild left foraminal stenosis. C4-5: Intermediate sized disc osteophyte complex with severe spinal canal stenosis and severe right foraminal stenosis. Moderate left neural foraminal stenosis. C5-6: Small disc bulge. No spinal canal or neural foraminal stenosis. C6-7: Small central disc protrusion with bilateral uncovertebral hypertrophy. Moderate spinal canal stenosis. No neural foraminal stenosis. C7-T1: Normal. Sagittal imaging of T1-2 and T2-3 is unremarkable. IMPRESSION: 1. Severe spinal canal stenosis and right neural foraminal stenosis at C4-5. 2. Severe left C2-3 neural foraminal stenosis. 3. Moderate spinal canal stenosis at C6-7. Electronically Signed   By: Ulyses Jarred M.D.   On: 09/23/2019 02:30       Assessment & Plan:   Problem List Items Addressed This Visit    Essential hypertension    Continue losartan and amlodipine.  Pressures doing well.  Follow pressures.  Follow metabolic panel.       Relevant Orders   Basic metabolic panel   Hand tingling    Bilateral hand numbness/tingling - right > left.  Tries splints.  Previous MRI - spinal stenosis as outlined.  Discussed CTS.  Discussed neck issues.  Obtain NCS.        Relevant Orders   Ambulatory referral to Neurology   Health care maintenance    Physical today 01/29/21.  Colonoscopy 01/2016 - normal.  PSA 09/2019 - 1.08.       Hyperlipidemia    On simvastatin.  Low cholesterol diet and exercise.  Follow lipid panel and liver function tests.  Relevant Orders   Hepatic function panel   Lipid panel   Neck pain    Previous MRI c-spine - spinal stenosis and foraminal stenosis.  Saw NSU.  With finger numbness/tingling as outlined.  May be related  more to CTS.  Tried splints.  Plan NCS given persistent symptoms/problems.       OSA on CPAP    Continue cpap.  Follow.       Prostate cancer screening - Primary   Relevant Orders   PSA, Medicare   Right knee pain    Persistent.  Saw ortho.  Planning surgery in future.  Follow.           [name], MD

## 2021-01-31 ENCOUNTER — Encounter: Payer: Self-pay | Admitting: Internal Medicine

## 2021-01-31 NOTE — Assessment & Plan Note (Signed)
Previous MRI c-spine - spinal stenosis and foraminal stenosis.  Saw NSU.  With finger numbness/tingling as outlined.  May be related more to CTS.  Tried splints.  Plan NCS given persistent symptoms/problems.

## 2021-01-31 NOTE — Assessment & Plan Note (Signed)
Bilateral hand numbness/tingling - right > left.  Tries splints.  Previous MRI - spinal stenosis as outlined.  Discussed CTS.  Discussed neck issues.  Obtain NCS.

## 2021-01-31 NOTE — Assessment & Plan Note (Signed)
Persistent.  Saw ortho.  Planning surgery in future.  Follow.

## 2021-01-31 NOTE — Assessment & Plan Note (Signed)
Continue cpap.  Follow.  

## 2021-01-31 NOTE — Assessment & Plan Note (Signed)
Continue losartan and amlodipine.  Pressures doing well.  Follow pressures.  Follow metabolic panel.

## 2021-01-31 NOTE — Assessment & Plan Note (Signed)
On simvastatin.  Low cholesterol diet and exercise.  Follow lipid panel and liver function tests.   

## 2021-01-31 NOTE — Assessment & Plan Note (Addendum)
Physical today 01/29/21.  Colonoscopy 01/2016 - normal.  PSA 09/2019 - 1.08.

## 2021-02-12 DIAGNOSIS — X32XXXA Exposure to sunlight, initial encounter: Secondary | ICD-10-CM | POA: Diagnosis not present

## 2021-02-12 DIAGNOSIS — D2271 Melanocytic nevi of right lower limb, including hip: Secondary | ICD-10-CM | POA: Diagnosis not present

## 2021-02-12 DIAGNOSIS — L570 Actinic keratosis: Secondary | ICD-10-CM | POA: Diagnosis not present

## 2021-02-12 DIAGNOSIS — D2262 Melanocytic nevi of left upper limb, including shoulder: Secondary | ICD-10-CM | POA: Diagnosis not present

## 2021-02-12 DIAGNOSIS — D2272 Melanocytic nevi of left lower limb, including hip: Secondary | ICD-10-CM | POA: Diagnosis not present

## 2021-02-12 DIAGNOSIS — D225 Melanocytic nevi of trunk: Secondary | ICD-10-CM | POA: Diagnosis not present

## 2021-02-12 DIAGNOSIS — D2261 Melanocytic nevi of right upper limb, including shoulder: Secondary | ICD-10-CM | POA: Diagnosis not present

## 2021-02-12 DIAGNOSIS — D485 Neoplasm of uncertain behavior of skin: Secondary | ICD-10-CM | POA: Diagnosis not present

## 2021-02-12 DIAGNOSIS — L821 Other seborrheic keratosis: Secondary | ICD-10-CM | POA: Diagnosis not present

## 2021-02-12 DIAGNOSIS — L858 Other specified epidermal thickening: Secondary | ICD-10-CM | POA: Diagnosis not present

## 2021-02-19 ENCOUNTER — Other Ambulatory Visit: Payer: Self-pay | Admitting: Internal Medicine

## 2021-02-24 ENCOUNTER — Other Ambulatory Visit: Payer: Self-pay | Admitting: Internal Medicine

## 2021-03-09 DIAGNOSIS — G4733 Obstructive sleep apnea (adult) (pediatric): Secondary | ICD-10-CM | POA: Diagnosis not present

## 2021-03-10 DIAGNOSIS — M1711 Unilateral primary osteoarthritis, right knee: Secondary | ICD-10-CM | POA: Diagnosis not present

## 2021-03-11 DIAGNOSIS — R202 Paresthesia of skin: Secondary | ICD-10-CM | POA: Diagnosis not present

## 2021-03-18 NOTE — Discharge Instructions (Signed)
Instructions after Total Knee Replacement   Japneet Staggs P. Missey Hasley, Jr., M.D.     Dept. of Orthopaedics & Sports Medicine  [facility]  [address]  [place], [place]  [address]  Phone: [phone]   Fax: [phone]    DIET: Drink plenty of non-alcoholic fluids. Resume your normal diet. Include foods high in fiber.  ACTIVITY:  You may use crutches or a walker with weight-bearing as tolerated, unless instructed otherwise. You may be weaned off of the walker or crutches by your Physical Therapist.  Do NOT place pillows under the knee. Anything placed under the knee could limit your ability to straighten the knee.   Continue doing gentle exercises. Exercising will reduce the pain and swelling, increase motion, and prevent muscle weakness.   Please continue to use the TED compression stockings for 6 weeks. You may remove the stockings at night, but should reapply them in the morning. Do not drive or operate any equipment until instructed.  WOUND CARE:  Continue to use the PolarCare or ice packs periodically to reduce pain and swelling. You may bathe or shower after the staples are removed at the first office visit following surgery.  MEDICATIONS: You may resume your regular medications. Please take the pain medication as prescribed on the medication. Do not take pain medication on an empty stomach. You have been given a prescription for a blood thinner (Lovenox or Coumadin). Please take the medication as instructed. (NOTE: After completing a 2 week course of Lovenox, take one Enteric-coated aspirin once a day. This along with elevation will help reduce the possibility of phlebitis in your operated leg.) Do not drive or drink alcoholic beverages when taking pain medications.  CALL THE OFFICE FOR: Temperature above 101 degrees Excessive bleeding or drainage on the dressing. Excessive swelling, coldness, or paleness of the toes. Persistent nausea and vomiting.  FOLLOW-UP:  You  should have an appointment to return to the office in 10-14 days after surgery. Arrangements have been made for continuation of Physical Therapy (either home therapy or outpatient therapy).   [facility] Department Directory         [email or web address]       [email or web address]          Cardiology  Appointments: [place] - [phone] [place] - [phone]  Endocrinology  Appointments: [place] - [phone] [place] - [phone]  Gastroenterology  Appointments: [place] - [phone] [place] - [phone]        General Surgery   Appointments: [place] - [phone]  Internal Medicine/Family Medicine  Appointments: [place] - [phone] [place] - [phone] [place] - [phone]  Metabolic and Weigh Loss Surgery  Appointments: [place] - [phone]        Neurology  Appointments: [place] - [phone] [place] - [phone]  Neurosurgery  Appointments: [place] - [phone]  Obstetrics & Gynecology  Appointments: [place] - [phone] [place] - [phone]        Pediatrics  Appointments: [place] - [phone] [place] - [phone]  Physiatry  Appointments: [place] -[phone]  Physical Therapy  Appointments: [place] - [phone] [place] - [phone]        Podiatry  Appointments: [place] - [phone] [place] - [phone]  Pulmonology  Appointments: [place] - [phone]  Rheumatology  Appointments: [place] - [phone]        [place] Location: [facility]  [address] , [place]  [address]  [place] Location: [facility] [address] [place], [place]  [address]  [place] Location: [facility] [address] [place], [place]  [address]    

## 2021-03-23 ENCOUNTER — Other Ambulatory Visit: Payer: Self-pay

## 2021-03-23 ENCOUNTER — Encounter
Admission: RE | Admit: 2021-03-23 | Discharge: 2021-03-23 | Disposition: A | Discharge status: Home / Self Care | Payer: PPO | Source: Ambulatory Visit | Attending: Orthopedic Surgery | Admitting: Orthopedic Surgery

## 2021-03-23 DIAGNOSIS — Z01818 Encounter for other preprocedural examination: Secondary | ICD-10-CM | POA: Insufficient documentation

## 2021-03-23 HISTORY — DX: Sleep apnea, unspecified: G47.30

## 2021-03-23 LAB — COMPREHENSIVE METABOLIC PANEL
ALT: 18 U/L (ref 0–44)
AST: 20 U/L (ref 15–41)
Albumin: 4.5 g/dL (ref 3.5–5.0)
Alkaline Phosphatase: 69 U/L (ref 38–126)
Anion gap: 6 (ref 5–15)
BUN: 15 mg/dL (ref 8–23)
CO2: 25 mmol/L (ref 22–32)
Calcium: 9.3 mg/dL (ref 8.9–10.3)
Chloride: 109 mmol/L (ref 98–111)
Creatinine, Ser: 0.72 mg/dL (ref 0.61–1.24)
GFR, Estimated: >60 mL/min (ref >60)
Glucose, Bld: 102 mg/dL — ABNORMAL HIGH (ref 70–99)
Potassium: 3.7 mmol/L (ref 3.5–5.1)
Sodium: 140 mmol/L (ref 135–145)
Total Bilirubin: 0.9 mg/dL (ref 0.3–1.2)
Total Protein: 7.3 g/dL (ref 6.5–8.1)

## 2021-03-23 LAB — URINALYSIS, ROUTINE W REFLEX MICROSCOPIC
Bilirubin Urine: NEGATIVE
Glucose, UA: NEGATIVE mg/dL
Hgb urine dipstick: NEGATIVE
Ketones, ur: NEGATIVE mg/dL
Leukocytes,Ua: NEGATIVE
Nitrite: NEGATIVE
Protein, ur: NEGATIVE mg/dL
Specific Gravity, Urine: 1.013 (ref 1.005–1.030)
pH: 8 (ref 5.0–8.0)

## 2021-03-23 LAB — PROTIME-INR
INR: 1 (ref 0.8–1.2)
Prothrombin Time: 12.8 seconds (ref 11.4–15.2)

## 2021-03-23 LAB — CBC
HCT: 42.4 % (ref 39.0–52.0)
Hemoglobin: 14 g/dL (ref 13.0–17.0)
MCH: 31 pg (ref 26.0–34.0)
MCHC: 33 g/dL (ref 30.0–36.0)
MCV: 93.8 fL (ref 80.0–100.0)
Platelets: 202 10*3/uL (ref 150–400)
RBC: 4.52 MIL/uL (ref 4.22–5.81)
RDW: 13 % (ref 11.5–15.5)
WBC: 5.9 10*3/uL (ref 4.0–10.5)
nRBC: 0 % (ref 0.0–0.2)

## 2021-03-23 LAB — SEDIMENTATION RATE: Sed Rate: 6 mm/hr (ref 0–20)

## 2021-03-23 LAB — C-REACTIVE PROTEIN: CRP: 0.5 mg/dL (ref <1.0)

## 2021-03-23 LAB — TYPE AND SCREEN
ABO/RH(D): A POS
Antibody Screen: NEGATIVE

## 2021-03-23 LAB — APTT: aPTT: 31 seconds (ref 24–36)

## 2021-03-23 LAB — SURGICAL PCR SCREEN
MRSA, PCR: NEGATIVE
Staphylococcus aureus: NEGATIVE

## 2021-03-23 NOTE — Patient Instructions (Addendum)
Your procedure is scheduled on: Wednesday April 01, 2021. Report to Day Surgery inside [facility] 2nd floor (stop by admissions desk first before getting on elevator). To find out your arrival time please call [phone] between 1PM - 3PM on Tuesday March 31, 2021.  Remember: Instructions that are not followed completely may result in serious medical risk,  up to and including death, or upon the discretion of your surgeon and anesthesiologist your  surgery may need to be rescheduled.     _X__ 1. Do not eat food after midnight the night before your procedure.                 No chewing gum or hard candies. You may drink clear liquids up to 2 hours                 before you are scheduled to arrive for your surgery- DO not drink clear                 liquids within 2 hours of the start of your surgery.                 Clear Liquids include:  water, apple juice without pulp, clear Gatorade, G2 or                  Gatorade Zero (avoid Red/Purple/Blue), Black Coffee or Tea (Do not add                 anything to coffee or tea).  __X__2.   Complete the "Ensure Clear Pre-surgery Clear Carbohydrate Drink" provided to you, 2 hours before arrival. **If you are diabetic you will be provided with an alternative drink, Gatorade Zero or G2.  __X__3.  On the morning of surgery brush your teeth with toothpaste and water, you                may rinse your mouth with mouthwash if you wish.  Do not swallow any toothpaste of mouthwash.     _X__ 4.  No Alcohol for 24 hours before or after surgery.   _X__ 5.  Do Not Smoke or use e-cigarettes For 24 Hours Prior to Your Surgery.                 Do not use any chewable tobacco products for at least 6 hours prior to                 Surgery.  _X__  6.  Do not use any recreational drugs (marijuana, cocaine, heroin, ecstasy, MDMA or other)                For at least one week prior to your surgery.  Combination of these drugs with  anesthesia                May have life threatening results.  __X__  7.  Notify your doctor if there is any change in your medical condition      (cold, fever, infections).     Do not wear jewelry, make-up, hairpins, clips or nail polish. Do not wear lotions, powders, or perfumes. You may wear deodorant. Do not shave 48 hours prior to surgery. Men may shave face and neck. Do not bring valuables to the hospital.    [facility] is not responsible for any belongings or valuables.  Contacts, dentures or bridgework may not be worn into surgery. Leave your suitcase in the car. After  surgery it may be brought to your room. For patients admitted to the hospital, discharge time is determined by your treatment team.   Patients discharged the day of surgery will not be allowed to drive home.   Make arrangements for someone to be with you for the first 24 hours of your Same Day Discharge.    Please read over the following fact sheets that you were given:   Total Joint Packet    __X__ Take these medicines the morning of surgery with A SIP OF WATER:    1. tamsulosin (FLOMAX) 0.4 MG   2. omeprazole (PRILOSEC) 20 MG   3. finasteride (PROSCAR) 5 MG  4.  5.  6.  ____ Fleet Enema (as directed)   __X__ Use CHG Soap (or wipes) as directed  ____ Use Benzoyl Peroxide Gel as instructed  ____ Use inhalers on the day of surgery  ____ Stop metformin 2 days prior to surgery    ____ Take 1/2 of usual insulin dose the night before surgery. No insulin the morning          of surgery.   __X__ Stop aspirin EC 81 MG as instructed.   __X__ Stop Anti-inflammatories such as Ibuprofen, Aleve, Advil, naproxen, BC powders.    __X__ Stop supplements until after surgery.    __X__ Do not start any herbal supplements before your procedure.   If you have any questions regarding your pre-procedure instructions,  Please call Pre-admit Testing at [phone].

## 2021-03-24 ENCOUNTER — Other Ambulatory Visit: Payer: Self-pay | Admitting: Internal Medicine

## 2021-03-24 DIAGNOSIS — E785 Hyperlipidemia, unspecified: Secondary | ICD-10-CM

## 2021-03-24 LAB — URINE CULTURE
Culture: NO GROWTH
Special Requests: NORMAL

## 2021-03-29 NOTE — H&P (Signed)
ORTHOPAEDIC HISTORY & PHYSICAL Regino Bellow, PA - 03/25/2021 9:00 AM EDT Formatting of this note is different from the original. Images from the original note were not included. Chief Complaint Chief Complaint  Patient presents with  . Knee Pain  H & P LEFT KNEE   Reason for Visit Brian Pearson is a 85 y.o. who presents today for history and physical. He is to undergo a left total knee arthroplasty on 04/01/2021. Patient was last seen in the clinic by Dr. Marry Guan on 01/13/2021. There is been no change in his condition since that time. He did receive a cortisone injection to the right knee on 03/10/2021/the knee is doing better.  He reports a long history of bilateral knee pain with the left knee more symptomatic. He localizes most of the pain along the medial aspect of the knees. He reports some swelling, no locking, and some giving way of the knees. The pain is aggravated by any weight bearing, going up and down stairs and rising after sitting. He reports significant start up stiffness. The knee pain limits his ability to ambulate long distances. The patient has not appreciated any significant improvement despite knee brace, activity modification, Tylenol, and intra-articular corticosteroid injections. He is not using any ambulatory aids. The patient states that the left knee pain has progressed to the point that it is significantly interfering with his activities of daily living.  Past Medical History Past Medical History:  Diagnosis Date  . BPH (benign prostatic hyperplasia)  . Esophageal stenosis 02/09/2016  . GERD (gastroesophageal reflux disease)  . HTN (hypertension)  . Hyperlipidemia  . Reflux esophagitis 02/09/2016   Past Surgical History Past Surgical History:  Procedure Laterality Date  . COLONOSCOPY 02/09/2016  Entire examined colon is normal/No Repeat/PYO  . EGD 02/09/2016  reflux esophagitis/GERD/Esophageal stenosis/No Repeat/PYO  . INGUINAL HERNIA REPAIR 1990    Past Family History Family History  Problem Relation Age of Onset  . Prostate cancer Father   Medications Current Outpatient Medications Ordered in Epic  Medication Sig Dispense Refill  . amLODIPine (NORVASC) 10 MG tablet Take 10 mg by mouth once daily  . aspirin 81 MG EC tablet Take 81 mg by mouth once daily.  . cholecalciferol (VITAMIN D3) 1,000 unit capsule Take 1,000 Units by mouth once daily  . co-enzyme Q-10, ubiquinone, 100 mg capsule Take 100 mg by mouth once daily  . cyanocobalamin (VITAMIN B12) 1000 MCG tablet Take 1,000 mcg by mouth once daily.  . finasteride (PROSCAR) 5 mg tablet Take 5 mg by mouth once daily  . losartan (COZAAR) 25 MG tablet Take 25 mg by mouth once daily  . multivitamin tablet Take 1 tablet by mouth once daily  . omega-3 fatty acids/fish oil 340-1,000 mg capsule Take 1 capsule by mouth 2 (two) times daily.  Marland Kitchen omeprazole (PRILOSEC) 20 MG DR capsule Take 1 capsule (20 mg total) by mouth once daily. Take 30 minutes before breakfast. 30 capsule 6  . simvastatin (ZOCOR) 20 MG tablet TAKE 1 TABLET BY MOUTH AT BEDTIME. NEEDS APPOINTMENT FOR ADDITIONAL OR 90 DAY REFILLS. 0  . tamsulosin (FLOMAX) 0.4 mg capsule Take 0.4 mg by mouth once daily  . fesoterodine (TOVIAZ) 4 mg ER tablet Take 4 mg by mouth once daily (Patient not taking: Reported on 03/25/2021 )  . multivitamin tablet Take 1 tablet by mouth once daily.  Marland Kitchen omega-3 fatty acids-fish oil 300-1,000 mg capsule Take 1 capsule by mouth once daily  . terazosin (HYTRIN) 2 MG capsule Take  2 mg by mouth nightly Reported on 01/12/2016   No current Epic-ordered facility-administered medications on file.   Allergies No Known Allergies   Review of Systems A comprehensive 14 point ROS was performed, reviewed, and the pertinent orthopaedic findings are documented in the HPI.  Exam BP (!) 142/80 (BP Location: Left upper arm, Patient Position: Sitting, BP Cuff Size: Adult)  Ht 185.4 cm (6\' 1" )  Wt (!) 102.7 kg (226  lb 6.4 oz)  BMI 29.87 kg/m   General: Well-developed well-nourished male seen in no acute distress.   HEENT: Atraumatic,normocephalic. Pupils are equal and reactive to light. Oropharynx is clear with moist mucosa  Lungs: Clear to auscultation bilaterally   Cardiovascular: Regular rate and rhythm. Normal S1, S2. No murmurs. No appreciable gallops or rubs. Peripheral pulses are palpable.  Abdomen: Soft, non-tender, nondistended. Bowel sounds present  Extremity: Left Knee: Soft tissue swelling: minimal Effusion: none Erythema: none Crepitance: mild Tenderness: medial Alignment: relative varus Mediolateral laxity: medial pseudolaxity Posterior sag: negative Patellar tracking: Good tracking without evidence of subluxation or tilt Atrophy: No significant atrophy.  Quadriceps tone was fair to good. Range of motion: 0/3/94 degrees   Neurological:  The patient is alert and oriented Sensation to light touch appears to be intact and within normal limits Gross motor strength appeared to be equal to 5/5  Vascular :  Peripheral pulses felt to be palpable. Capillary refill appears to be intact and within normal limits  X-ray  3 views of the knee taken on 01/13/2021 showed narrowing of the medial cartilage space with associated varus alignment. Osteophyte formation was noted as well as subchondral cirrhosis. There was no evidence of any fractures or dislocation. Patella appears to be tracking well.  Impression  1. 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 Patient is to discontinue his aspirin and fish oil 2. Did discuss postop course 3. Return to clinic 2 weeks postop. Sooner if any problems  This note was generated in part with voice recognition software and I apologize for any typographical errors that were not detected and corrected   Watt Climes PA Electronically signed by Regino Bellow, PA at 03/25/2021 9:20 AM EDT

## 2021-03-30 ENCOUNTER — Other Ambulatory Visit: Payer: Self-pay

## 2021-03-30 ENCOUNTER — Other Ambulatory Visit
Admission: RE | Admit: 2021-03-30 | Discharge: 2021-03-30 | Disposition: A | Discharge status: Home / Self Care | Payer: PPO | Source: Ambulatory Visit | Attending: Orthopedic Surgery | Admitting: Orthopedic Surgery

## 2021-03-30 DIAGNOSIS — Z01812 Encounter for preprocedural laboratory examination: Secondary | ICD-10-CM | POA: Diagnosis not present

## 2021-03-30 DIAGNOSIS — Z20822 Contact with and (suspected) exposure to covid-19: Secondary | ICD-10-CM | POA: Diagnosis not present

## 2021-03-30 LAB — SARS CORONAVIRUS 2 (TAT 6-24 HRS): SARS Coronavirus 2: NEGATIVE

## 2021-04-01 ENCOUNTER — Observation Stay: Payer: PPO

## 2021-04-01 ENCOUNTER — Other Ambulatory Visit: Payer: Self-pay

## 2021-04-01 ENCOUNTER — Ambulatory Visit: Payer: PPO | Admitting: Anesthesiology

## 2021-04-01 ENCOUNTER — Encounter
Admission: RE | Disposition: A | Discharge status: Home / Self Care | Payer: Self-pay | Source: Home / Self Care | Attending: Orthopedic Surgery

## 2021-04-01 ENCOUNTER — Encounter: Payer: Self-pay | Admitting: Orthopedic Surgery

## 2021-04-01 ENCOUNTER — Observation Stay
Admission: RE | Admit: 2021-04-01 | Discharge: 2021-04-02 | Disposition: A | Discharge status: Home / Self Care | Payer: PPO | Attending: Orthopedic Surgery | Admitting: Orthopedic Surgery

## 2021-04-01 DIAGNOSIS — Z471 Aftercare following joint replacement surgery: Secondary | ICD-10-CM | POA: Diagnosis not present

## 2021-04-01 DIAGNOSIS — I10 Essential (primary) hypertension: Secondary | ICD-10-CM | POA: Insufficient documentation

## 2021-04-01 DIAGNOSIS — Z7982 Long term (current) use of aspirin: Secondary | ICD-10-CM | POA: Diagnosis not present

## 2021-04-01 DIAGNOSIS — Z96659 Presence of unspecified artificial knee joint: Secondary | ICD-10-CM

## 2021-04-01 DIAGNOSIS — G4733 Obstructive sleep apnea (adult) (pediatric): Secondary | ICD-10-CM | POA: Diagnosis not present

## 2021-04-01 DIAGNOSIS — M25562 Pain in left knee: Secondary | ICD-10-CM | POA: Diagnosis present

## 2021-04-01 DIAGNOSIS — M1712 Unilateral primary osteoarthritis, left knee: Secondary | ICD-10-CM | POA: Diagnosis not present

## 2021-04-01 DIAGNOSIS — Z96652 Presence of left artificial knee joint: Secondary | ICD-10-CM

## 2021-04-01 DIAGNOSIS — Z79899 Other long term (current) drug therapy: Secondary | ICD-10-CM | POA: Diagnosis not present

## 2021-04-01 DIAGNOSIS — E785 Hyperlipidemia, unspecified: Secondary | ICD-10-CM | POA: Diagnosis not present

## 2021-04-01 DIAGNOSIS — R030 Elevated blood-pressure reading, without diagnosis of hypertension: Secondary | ICD-10-CM | POA: Insufficient documentation

## 2021-04-01 DIAGNOSIS — K219 Gastro-esophageal reflux disease without esophagitis: Secondary | ICD-10-CM | POA: Diagnosis not present

## 2021-04-01 HISTORY — PX: KNEE ARTHROPLASTY: SHX992

## 2021-04-01 LAB — ABO/RH: ABO/RH(D): A POS

## 2021-04-01 SURGERY — ARTHROPLASTY, KNEE, TOTAL, USING IMAGELESS COMPUTER-ASSISTED NAVIGATION
Anesthesia: General | Site: Knee | Laterality: Left

## 2021-04-01 MED ORDER — TAMSULOSIN HCL 0.4 MG PO CAPS
0.4000 mg | ORAL_CAPSULE | Freq: Every day | ORAL | Status: DC
  Administered 2021-04-02: 0.4 mg via ORAL
  Filled 2021-04-01 (×2): qty 1

## 2021-04-01 MED ORDER — BISACODYL 10 MG RE SUPP
10.0000 mg | Freq: Every day | RECTAL | Status: DC | PRN
  Administered 2021-04-02: 10 mg via RECTAL
  Filled 2021-04-01: qty 1

## 2021-04-01 MED ORDER — FENTANYL CITRATE (PF) 100 MCG/2ML IJ SOLN
INTRAMUSCULAR | Status: AC
  Filled 2021-04-01: qty 2

## 2021-04-01 MED ORDER — BUPIVACAINE LIPOSOME 1.3 % IJ SUSP
INTRAMUSCULAR | Status: AC
  Filled 2021-04-01: qty 20

## 2021-04-01 MED ORDER — ONDANSETRON HCL 4 MG/2ML IJ SOLN: 4.0000 mg | Freq: Once | INTRAMUSCULAR | Status: DC | PRN

## 2021-04-01 MED ORDER — SURGIPHOR WOUND IRRIGATION SYSTEM - OPTIME
TOPICAL | Status: DC | PRN
  Administered 2021-04-01: 1 via TOPICAL

## 2021-04-01 MED ORDER — GLYCOPYRROLATE 0.2 MG/ML IJ SOLN
INTRAMUSCULAR | Status: DC | PRN
  Administered 2021-04-01: .2 mg via INTRAVENOUS

## 2021-04-01 MED ORDER — TRANEXAMIC ACID-NACL 1000-0.7 MG/100ML-% IV SOLN
INTRAVENOUS | Status: AC
  Filled 2021-04-01: qty 100

## 2021-04-01 MED ORDER — METOCLOPRAMIDE HCL 10 MG PO TABS
10.0000 mg | ORAL_TABLET | Freq: Three times a day (TID) | ORAL | Status: DC
  Administered 2021-04-01 – 2021-04-02 (×4): 10 mg via ORAL
  Filled 2021-04-01 (×4): qty 1

## 2021-04-01 MED ORDER — HYDROMORPHONE HCL 1 MG/ML IJ SOLN: 0.5000 mg | INTRAMUSCULAR | Status: DC | PRN

## 2021-04-01 MED ORDER — TRAMADOL HCL 50 MG PO TABS
50.0000 mg | ORAL_TABLET | ORAL | Status: DC | PRN
  Administered 2021-04-01 – 2021-04-02 (×3): 50 mg via ORAL
  Filled 2021-04-01 (×3): qty 1

## 2021-04-01 MED ORDER — CHLORHEXIDINE GLUCONATE 4 % EX LIQD: 60.0000 mL | Freq: Once | CUTANEOUS | Status: DC

## 2021-04-01 MED ORDER — TRANEXAMIC ACID-NACL 1000-0.7 MG/100ML-% IV SOLN
INTRAVENOUS | Status: AC
  Administered 2021-04-01: 1000 mg via INTRAVENOUS
  Filled 2021-04-01: qty 100

## 2021-04-01 MED ORDER — SODIUM CHLORIDE 0.9 % IV SOLN
INTRAVENOUS | Status: DC | PRN
  Administered 2021-04-01: 20 ug/min via INTRAVENOUS

## 2021-04-01 MED ORDER — ACETAMINOPHEN 325 MG PO TABS: 325.0000 mg | ORAL_TABLET | Freq: Four times a day (QID) | ORAL | Status: DC | PRN

## 2021-04-01 MED ORDER — VITAMIN D3 25 MCG (1000 UNIT) PO TABS
1000.0000 [IU] | ORAL_TABLET | Freq: Every day | ORAL | Status: DC
  Administered 2021-04-01 – 2021-04-02 (×2): 1000 [IU] via ORAL
  Filled 2021-04-01 (×4): qty 1

## 2021-04-01 MED ORDER — ENSURE PRE-SURGERY PO LIQD
296.0000 mL | Freq: Once | ORAL | Status: DC
  Filled 2021-04-01: qty 296

## 2021-04-01 MED ORDER — TRANEXAMIC ACID-NACL 1000-0.7 MG/100ML-% IV SOLN
1000.0000 mg | INTRAVENOUS | Status: AC
  Administered 2021-04-01: 1000 mg via INTRAVENOUS

## 2021-04-01 MED ORDER — OMEGA-3-ACID ETHYL ESTERS 1 G PO CAPS
1.0000 g | ORAL_CAPSULE | Freq: Every day | ORAL | Status: DC
  Administered 2021-04-01 – 2021-04-02 (×2): 1 g via ORAL
  Filled 2021-04-01 (×2): qty 1

## 2021-04-01 MED ORDER — BUPIVACAINE HCL (PF) 0.5 % IJ SOLN
INTRAMUSCULAR | Status: DC | PRN
  Administered 2021-04-01: 3 mL

## 2021-04-01 MED ORDER — FERROUS SULFATE 325 (65 FE) MG PO TABS
325.0000 mg | ORAL_TABLET | Freq: Two times a day (BID) | ORAL | Status: DC
  Administered 2021-04-01 – 2021-04-02 (×2): 325 mg via ORAL
  Filled 2021-04-01 (×2): qty 1

## 2021-04-01 MED ORDER — IPRATROPIUM-ALBUTEROL 0.5-2.5 (3) MG/3ML IN SOLN
RESPIRATORY_TRACT | Status: AC
  Filled 2021-04-01: qty 3

## 2021-04-01 MED ORDER — OXYCODONE HCL 5 MG PO TABS: 10.0000 mg | ORAL_TABLET | ORAL | Status: DC | PRN

## 2021-04-01 MED ORDER — TRANEXAMIC ACID-NACL 1000-0.7 MG/100ML-% IV SOLN: 1000.0000 mg | Freq: Once | INTRAVENOUS | Status: AC

## 2021-04-01 MED ORDER — ONE-A-DAY MENS PO TABS
1.0000 | ORAL_TABLET | Freq: Every day | ORAL | Status: DC
  Filled 2021-04-01: qty 1

## 2021-04-01 MED ORDER — SODIUM CHLORIDE FLUSH 0.9 % IV SOLN
INTRAVENOUS | Status: AC
  Filled 2021-04-01: qty 40

## 2021-04-01 MED ORDER — ACETAMINOPHEN 10 MG/ML IV SOLN
INTRAVENOUS | Status: AC
  Filled 2021-04-01: qty 100

## 2021-04-01 MED ORDER — PROPOFOL 1000 MG/100ML IV EMUL
INTRAVENOUS | Status: AC
  Filled 2021-04-01: qty 100

## 2021-04-01 MED ORDER — ACETAMINOPHEN 10 MG/ML IV SOLN
INTRAVENOUS | Status: DC | PRN
  Administered 2021-04-01: 1000 mg via INTRAVENOUS

## 2021-04-01 MED ORDER — CELECOXIB 200 MG PO CAPS
ORAL_CAPSULE | ORAL | Status: AC
  Administered 2021-04-01: 400 mg via ORAL
  Filled 2021-04-01: qty 2

## 2021-04-01 MED ORDER — CEFAZOLIN SODIUM-DEXTROSE 2-4 GM/100ML-% IV SOLN
INTRAVENOUS | Status: AC
  Administered 2021-04-01: 2 g via INTRAVENOUS
  Filled 2021-04-01: qty 100

## 2021-04-01 MED ORDER — SODIUM CHLORIDE 0.9 % IR SOLN
Status: DC | PRN
  Administered 2021-04-01: 500 mL

## 2021-04-01 MED ORDER — OXYCODONE HCL 5 MG PO TABS
5.0000 mg | ORAL_TABLET | ORAL | Status: DC | PRN
  Administered 2021-04-01: 5 mg via ORAL
  Filled 2021-04-01: qty 1

## 2021-04-01 MED ORDER — CHLORHEXIDINE GLUCONATE 0.12 % MT SOLN: 15.0000 mL | Freq: Once | OROMUCOSAL | Status: AC

## 2021-04-01 MED ORDER — MAGNESIUM HYDROXIDE 400 MG/5ML PO SUSP
30.0000 mL | Freq: Every day | ORAL | Status: DC
  Administered 2021-04-01 – 2021-04-02 (×2): 30 mL via ORAL
  Filled 2021-04-01 (×2): qty 30

## 2021-04-01 MED ORDER — FESOTERODINE FUMARATE ER 4 MG PO TB24
4.0000 mg | ORAL_TABLET | Freq: Every day | ORAL | Status: DC
  Administered 2021-04-01 – 2021-04-02 (×2): 4 mg via ORAL
  Filled 2021-04-01 (×3): qty 1

## 2021-04-01 MED ORDER — CELECOXIB 200 MG PO CAPS
200.0000 mg | ORAL_CAPSULE | Freq: Two times a day (BID) | ORAL | Status: DC
  Administered 2021-04-01 – 2021-04-02 (×3): 200 mg via ORAL
  Filled 2021-04-01 (×3): qty 1

## 2021-04-01 MED ORDER — FENTANYL CITRATE (PF) 100 MCG/2ML IJ SOLN
INTRAMUSCULAR | Status: DC | PRN
  Administered 2021-04-01 (×4): 25 ug via INTRAVENOUS

## 2021-04-01 MED ORDER — ALUM & MAG HYDROXIDE-SIMETH 200-200-20 MG/5ML PO SUSP: 30.0000 mL | ORAL | Status: DC | PRN

## 2021-04-01 MED ORDER — DEXAMETHASONE SODIUM PHOSPHATE 10 MG/ML IJ SOLN
INTRAMUSCULAR | Status: AC
  Administered 2021-04-01: 8 mg via INTRAVENOUS
  Filled 2021-04-01: qty 1

## 2021-04-01 MED ORDER — PROPOFOL 500 MG/50ML IV EMUL
INTRAVENOUS | Status: DC | PRN
  Administered 2021-04-01: 75 ug/kg/min via INTRAVENOUS

## 2021-04-01 MED ORDER — PROPOFOL 10 MG/ML IV BOLUS
INTRAVENOUS | Status: DC | PRN
  Administered 2021-04-01 (×2): 30 mg via INTRAVENOUS

## 2021-04-01 MED ORDER — SODIUM CHLORIDE 0.9 % IV SOLN
INTRAVENOUS | Status: DC | PRN
  Administered 2021-04-01: 60 mL

## 2021-04-01 MED ORDER — CEFAZOLIN SODIUM-DEXTROSE 2-4 GM/100ML-% IV SOLN
2.0000 g | Freq: Four times a day (QID) | INTRAVENOUS | Status: AC
  Administered 2021-04-01: 2 g via INTRAVENOUS
  Filled 2021-04-01 (×2): qty 100

## 2021-04-01 MED ORDER — FENTANYL CITRATE (PF) 100 MCG/2ML IJ SOLN
25.0000 ug | INTRAMUSCULAR | Status: DC | PRN
Start: 2021-04-01 — End: 2021-04-01

## 2021-04-01 MED ORDER — EPHEDRINE SULFATE 50 MG/ML IJ SOLN
INTRAMUSCULAR | Status: DC | PRN
  Administered 2021-04-01: 10 mg via INTRAVENOUS

## 2021-04-01 MED ORDER — PHENOL 1.4 % MT LIQD
1.0000 | OROMUCOSAL | Status: DC | PRN
  Filled 2021-04-01: qty 177

## 2021-04-01 MED ORDER — ONDANSETRON HCL 4 MG PO TABS: 4.0000 mg | ORAL_TABLET | Freq: Four times a day (QID) | ORAL | Status: DC | PRN

## 2021-04-01 MED ORDER — DEXAMETHASONE SODIUM PHOSPHATE 10 MG/ML IJ SOLN: 8.0000 mg | Freq: Once | INTRAMUSCULAR | Status: AC

## 2021-04-01 MED ORDER — AMLODIPINE BESYLATE 10 MG PO TABS
10.0000 mg | ORAL_TABLET | Freq: Every day | ORAL | Status: DC
  Administered 2021-04-01 – 2021-04-02 (×2): 10 mg via ORAL
  Filled 2021-04-01 (×3): qty 1

## 2021-04-01 MED ORDER — ACETAMINOPHEN 10 MG/ML IV SOLN
1000.0000 mg | Freq: Four times a day (QID) | INTRAVENOUS | Status: AC
  Administered 2021-04-01 – 2021-04-02 (×3): 1000 mg via INTRAVENOUS
  Filled 2021-04-01 (×3): qty 100

## 2021-04-01 MED ORDER — CHLORHEXIDINE GLUCONATE 0.12 % MT SOLN
OROMUCOSAL | Status: AC
  Administered 2021-04-01: 15 mL via OROMUCOSAL
  Filled 2021-04-01: qty 15

## 2021-04-01 MED ORDER — ONDANSETRON HCL 4 MG/2ML IJ SOLN: 4.0000 mg | Freq: Four times a day (QID) | INTRAMUSCULAR | Status: DC | PRN

## 2021-04-01 MED ORDER — BUPIVACAINE HCL (PF) 0.25 % IJ SOLN
INTRAMUSCULAR | Status: AC
  Filled 2021-04-01: qty 60

## 2021-04-01 MED ORDER — IPRATROPIUM-ALBUTEROL 0.5-2.5 (3) MG/3ML IN SOLN
3.0000 mL | Freq: Once | RESPIRATORY_TRACT | Status: AC
  Administered 2021-04-01: 3 mL via RESPIRATORY_TRACT

## 2021-04-01 MED ORDER — FINASTERIDE 5 MG PO TABS
5.0000 mg | ORAL_TABLET | Freq: Every day | ORAL | Status: DC
  Administered 2021-04-02: 5 mg via ORAL
  Filled 2021-04-01 (×2): qty 1

## 2021-04-01 MED ORDER — FLEET ENEMA 7-19 GM/118ML RE ENEM: 1.0000 | ENEMA | Freq: Once | RECTAL | Status: DC | PRN

## 2021-04-01 MED ORDER — ORAL CARE MOUTH RINSE: 15.0000 mL | Freq: Once | OROMUCOSAL | Status: AC

## 2021-04-01 MED ORDER — ADULT MULTIVITAMIN W/MINERALS CH
1.0000 | ORAL_TABLET | Freq: Every day | ORAL | Status: DC
  Administered 2021-04-02: 1 via ORAL
  Filled 2021-04-01: qty 1

## 2021-04-01 MED ORDER — DIPHENHYDRAMINE HCL 12.5 MG/5ML PO ELIX: 12.5000 mg | ORAL_SOLUTION | ORAL | Status: DC | PRN

## 2021-04-01 MED ORDER — MENTHOL 3 MG MT LOZG
1.0000 | LOZENGE | OROMUCOSAL | Status: DC | PRN
  Filled 2021-04-01: qty 9

## 2021-04-01 MED ORDER — CEFAZOLIN SODIUM-DEXTROSE 2-4 GM/100ML-% IV SOLN
2.0000 g | INTRAVENOUS | Status: AC
  Administered 2021-04-01: 2 g via INTRAVENOUS

## 2021-04-01 MED ORDER — NEOMYCIN-POLYMYXIN B GU 40-200000 IR SOLN
Status: AC
  Filled 2021-04-01: qty 20

## 2021-04-01 MED ORDER — CELECOXIB 200 MG PO CAPS: 400.0000 mg | ORAL_CAPSULE | Freq: Once | ORAL | Status: AC

## 2021-04-01 MED ORDER — ENOXAPARIN SODIUM 30 MG/0.3ML ~~LOC~~ SOLN
30.0000 mg | Freq: Two times a day (BID) | SUBCUTANEOUS | Status: DC
  Administered 2021-04-02: 30 mg via SUBCUTANEOUS
  Filled 2021-04-01: qty 0.3

## 2021-04-01 MED ORDER — SENNOSIDES-DOCUSATE SODIUM 8.6-50 MG PO TABS
1.0000 | ORAL_TABLET | Freq: Two times a day (BID) | ORAL | Status: DC
  Administered 2021-04-01 – 2021-04-02 (×3): 1 via ORAL
  Filled 2021-04-01 (×3): qty 1

## 2021-04-01 MED ORDER — SODIUM CHLORIDE 0.9 % IV SOLN: INTRAVENOUS | Status: DC

## 2021-04-01 MED ORDER — PANTOPRAZOLE SODIUM 40 MG PO TBEC
40.0000 mg | DELAYED_RELEASE_TABLET | Freq: Two times a day (BID) | ORAL | Status: DC
  Administered 2021-04-01 – 2021-04-02 (×2): 40 mg via ORAL
  Filled 2021-04-01 (×3): qty 1

## 2021-04-01 MED ORDER — LACTATED RINGERS IV SOLN: INTRAVENOUS | Status: DC

## 2021-04-01 MED ORDER — LOSARTAN POTASSIUM 25 MG PO TABS
25.0000 mg | ORAL_TABLET | Freq: Every day | ORAL | Status: DC
  Administered 2021-04-01 – 2021-04-02 (×2): 25 mg via ORAL
  Filled 2021-04-01 (×2): qty 1

## 2021-04-01 MED ORDER — SIMVASTATIN 20 MG PO TABS
20.0000 mg | ORAL_TABLET | Freq: Every day | ORAL | Status: DC
  Administered 2021-04-01: 20 mg via ORAL
  Filled 2021-04-01: qty 1

## 2021-04-01 MED ORDER — BUPIVACAINE HCL (PF) 0.25 % IJ SOLN
INTRAMUSCULAR | Status: DC | PRN
  Administered 2021-04-01: 60 mL

## 2021-04-01 SURGICAL SUPPLY — 73 items
ATTUNE MED DOME PAT 41 KNEE (Knees) ×1 IMPLANT
ATTUNE PS FEM LT CEM SZ9 KNEE (Femur) ×1 IMPLANT
BASE TIBIAL ATTUNE KNEE SZ9 (Knees) IMPLANT
BATTERY INSTRU NAVIGATION (MISCELLANEOUS) ×8 IMPLANT
BLADE SAW 70X12.5 (BLADE) ×2 IMPLANT
BLADE SAW 90X13X1.19 OSCILLAT (BLADE) ×2 IMPLANT
BLADE SAW 90X25X1.19 OSCILLAT (BLADE) ×2 IMPLANT
CEMENT HV SMART SET (Cement) ×1 IMPLANT
COOLER POLAR GLACIER W/PUMP (MISCELLANEOUS) ×2 IMPLANT
COVER WAND RF STERILE (DRAPES) ×2 IMPLANT
CUFF TOURN SGL QUICK 24 (TOURNIQUET CUFF)
CUFF TOURN SGL QUICK 30 (TOURNIQUET CUFF)
CUFF TRNQT CYL 24X4X16.5-23 (TOURNIQUET CUFF) IMPLANT
CUFF TRNQT CYL 30X4X21-28X (TOURNIQUET CUFF) IMPLANT
DRAPE 3/4 80X56 (DRAPES) ×2 IMPLANT
DRESSING PEEL AND PLAC PRVNA20 (GAUZE/BANDAGES/DRESSINGS) ×1 IMPLANT
DRSG DERMACEA 8X12 NADH (GAUZE/BANDAGES/DRESSINGS) ×2 IMPLANT
DRSG MEPILEX SACRM 8.7X9.8 (GAUZE/BANDAGES/DRESSINGS) ×2 IMPLANT
DRSG OPSITE POSTOP 4X14 (GAUZE/BANDAGES/DRESSINGS) ×2 IMPLANT
DRSG PEEL AND PLACE PREVENA 20 (GAUZE/BANDAGES/DRESSINGS) ×2
DRSG TEGADERM 4X4.75 (GAUZE/BANDAGES/DRESSINGS) ×2 IMPLANT
DURAPREP 26ML APPLICATOR (WOUND CARE) ×4 IMPLANT
ELECT CAUTERY BLADE 6.4 (BLADE) ×2 IMPLANT
ELECT REM PT RETURN 9FT ADLT (ELECTROSURGICAL) ×2
ELECTRODE REM PT RTRN 9FT ADLT (ELECTROSURGICAL) ×1 IMPLANT
EX-PIN ORTHOLOCK NAV 4X150 (PIN) ×4 IMPLANT
GLOVE SURG ENC MOIS LTX SZ7.5 (GLOVE) ×4 IMPLANT
GLOVE SURG ENC TEXT LTX SZ7.5 (GLOVE) ×4 IMPLANT
GLOVE SURG UNDER LTX SZ8 (GLOVE) ×2 IMPLANT
GLOVE SURG UNDER POLY LF SZ7.5 (GLOVE) ×2 IMPLANT
GOWN STRL REUS W/ TWL LRG LVL3 (GOWN DISPOSABLE) ×2 IMPLANT
GOWN STRL REUS W/ TWL XL LVL3 (GOWN DISPOSABLE) ×1 IMPLANT
GOWN STRL REUS W/TWL LRG LVL3 (GOWN DISPOSABLE) ×4
GOWN STRL REUS W/TWL XL LVL3 (GOWN DISPOSABLE) ×1
HEMOVAC 400CC 10FR (MISCELLANEOUS) ×2 IMPLANT
HOLDER FOLEY CATH W/STRAP (MISCELLANEOUS) ×2 IMPLANT
HOOD PEEL AWAY FLYTE STAYCOOL (MISCELLANEOUS) ×4 IMPLANT
INSERT TIBIAL ATTUNE SZ9 5MM (Insert) ×1 IMPLANT
IRRIGATION SURGIPHOR STRL (IV SOLUTION) ×2 IMPLANT
IV NS IRRIG 3000ML ARTHROMATIC (IV SOLUTION) ×2 IMPLANT
KIT TURNOVER KIT A (KITS) ×2 IMPLANT
KNIFE SCULPS 14X20 (INSTRUMENTS) ×2 IMPLANT
LABEL OR SOLS (LABEL) ×2 IMPLANT
MANIFOLD NEPTUNE II (INSTRUMENTS) ×4 IMPLANT
NDL SAFETY ECLIPSE 18X1.5 (NEEDLE) ×1 IMPLANT
NDL SPNL 20GX3.5 QUINCKE YW (NEEDLE) ×2 IMPLANT
NEEDLE HYPO 18GX1.5 SHARP (NEEDLE) ×2
NEEDLE SPNL 20GX3.5 QUINCKE YW (NEEDLE) ×4 IMPLANT
NS IRRIG 500ML POUR BTL (IV SOLUTION) ×2 IMPLANT
PACK TOTAL KNEE (MISCELLANEOUS) ×2 IMPLANT
PAD WRAPON POLAR KNEE (MISCELLANEOUS) ×1 IMPLANT
PENCIL SMOKE EVACUATOR COATED (MISCELLANEOUS) ×2 IMPLANT
PIN FIXATION 1/8DIA X 3INL (PIN) ×6 IMPLANT
PULSAVAC PLUS IRRIG FAN TIP (DISPOSABLE) ×2
SOL PREP PVP 2OZ (MISCELLANEOUS) ×2
SOLUTION PREP PVP 2OZ (MISCELLANEOUS) ×1 IMPLANT
SPONGE DRAIN TRACH 4X4 STRL 2S (GAUZE/BANDAGES/DRESSINGS) ×2 IMPLANT
STAPLER SKIN PROX 35W (STAPLE) ×2 IMPLANT
STOCKINETTE IMPERV 14X48 (MISCELLANEOUS) IMPLANT
STRAP TIBIA SHORT (MISCELLANEOUS) ×2 IMPLANT
SUCTION FRAZIER HANDLE 10FR (MISCELLANEOUS) ×2
SUCTION TUBE FRAZIER 10FR DISP (MISCELLANEOUS) ×1 IMPLANT
SUT VIC AB 0 CT1 36 (SUTURE) ×4 IMPLANT
SUT VIC AB 1 CT1 36 (SUTURE) ×4 IMPLANT
SUT VIC AB 2-0 CT2 27 (SUTURE) ×2 IMPLANT
SYR 20ML LL LF (SYRINGE) ×2 IMPLANT
SYR 30ML LL (SYRINGE) ×4 IMPLANT
TIBIAL BASE ATTUNE KNEE SZ9 (Knees) ×2 IMPLANT
TIP FAN IRRIG PULSAVAC PLUS (DISPOSABLE) ×1 IMPLANT
TOWEL OR 17X26 4PK STRL BLUE (TOWEL DISPOSABLE) ×2 IMPLANT
TOWER CARTRIDGE SMART MIX (DISPOSABLE) ×2 IMPLANT
TRAY FOLEY MTR SLVR 16FR STAT (SET/KITS/TRAYS/PACK) ×2 IMPLANT
WRAPON POLAR PAD KNEE (MISCELLANEOUS) ×2

## 2021-04-01 NOTE — Evaluation (Signed)
Physical Therapy Evaluation Patient Details Name: Brian Pearson MRN: [id number] DOB: [birth year] Today's Date: 04/01/2021   History of Present Illness  85 y/o male s/p L TKA 4/13.  Clinical Impression  Pt did very well with POD0 PT session. He was able to do SLRs, perform bed mobility/transfer w/o assist, had nearly 90 degrees of flexion and ambulated 75 ft safely and with consistent speed and cadence.  Pt showed great effort t/o the session.    Follow Up Recommendations Home health PT;Follow surgeon's recommendation for DC plan and follow-up therapies    Equipment Recommendations  Rolling walker with 5" wheels;3in1 (PT)    Recommendations for Other Services       Precautions / Restrictions Precautions Precautions: Fall Restrictions Weight Bearing Restrictions: Yes LLE Weight Bearing: Weight bearing as tolerated      Mobility  Bed Mobility Overal bed mobility: Modified Independent             General bed mobility comments: Pt able to get himself to sitting EOB w/o issue or assist    Transfers Overall transfer level: Modified independent Equipment used: Rolling walker (2 wheeled)             General transfer comment: Pt was able to rise to standing w/o assist from low bed setting, minimal cuing for set up and sequencing  Ambulation/Gait Ambulation/Gait assistance: Min guard Gait Distance (Feet): 75 Feet Assistive device: Rolling walker (2 wheeled)       General Gait Details: Pt was able to quickly assume consistent cadence and walker momentum with no obvious hesitancy with WBing on the L and good overall confidence and safety.  His O2, on room air, remained in the mid/low 90s with only minimal c/o fatigue.  Stairs            Wheelchair Mobility    Modified Rankin (Stroke Patients Only)       Balance Overall balance assessment: Modified Independent                                           Pertinent Vitals/Pain Pain  Assessment: 0-10 Pain Score: 5  Pain Location: L knee    Home Living Family/patient expects to be discharged to:: Private residence Living Arrangements: Spouse/significant other Available Help at Discharge: Family;Available 24 hours/day Type of Home: House Home Access:  (small threshold)     Home Layout: One level Home Equipment: None      Prior Function Level of Independence: Independent         Comments: Pt reports that he gets out in the community, drives, can manage ADLs     Hand Dominance        Extremity/Trunk Assessment   Upper Extremity Assessment Upper Extremity Assessment: Overall WFL for tasks assessed    Lower Extremity Assessment Lower Extremity Assessment: Overall WFL for tasks assessed (expected post-op weakness, but able to do SLRs w/o assist and good quality of motion with all tasks)       Communication   Communication: No difficulties;HOH  Cognition Arousal/Alertness: Awake/alert Behavior During Therapy: WFL for tasks assessed/performed Overall Cognitive Status: Within Functional Limits for tasks assessed                                        General Comments  Exercises Total Joint Exercises Ankle Circles/Pumps: AROM;10 reps Quad Sets: Strengthening;10 reps Short Arc Quad: Strengthening;10 reps (light resistance) Heel Slides: AROM;10 reps (with resisted leg ext) Hip ABduction/ADduction: Strengthening;10 reps Straight Leg Raises: AROM;10 reps Knee Flexion: PROM;5 reps Goniometric ROM: 0-87   Assessment/Plan    PT Assessment Patient needs continued PT services  PT Problem List Decreased range of motion;Decreased strength;Decreased activity tolerance;Decreased balance;Decreased mobility;Decreased coordination;Decreased knowledge of use of DME;Decreased safety awareness;Pain       PT Treatment Interventions DME instruction;Gait training;Stair training;Functional mobility training;Therapeutic activities;Therapeutic  exercise;Balance training;Neuromuscular re-education;Patient/family education    PT Goals (Current goals can be found in the Care Plan section)  Acute Rehab PT Goals Patient Stated Goal: go home PT Goal Formulation: With patient Time For Goal Achievement: 04/15/21 Potential to Achieve Goals: Good    Frequency BID   Barriers to discharge        Co-evaluation               AM-PAC PT "6 Clicks" Mobility  Outcome Measure Help needed turning from your back to your side while in a flat bed without using bedrails?: None Help needed moving from lying on your back to sitting on the side of a flat bed without using bedrails?: None Help needed moving to and from a bed to a chair (including a wheelchair)?: None Help needed standing up from a chair using your arms (e.g., wheelchair or bedside chair)?: None Help needed to walk in hospital room?: None Help needed climbing 3-5 steps with a railing? : A Little 6 Click Score: 23    End of Session Equipment Utilized During Treatment: Gait belt Activity Tolerance: Patient tolerated treatment well Patient left: in chair;with call bell/phone within reach;with nursing/sitter in room Nurse Communication: Mobility status (O2 remained in the 90s on room air with activity) PT Visit Diagnosis: Muscle weakness (generalized) (M62.81);Difficulty in walking, not elsewhere classified (R26.2);Pain Pain - Right/Left: Left Pain - part of body: Knee    Time: 1435-1520 PT Time Calculation (min) (ACUTE ONLY): 45 min   Charges:   PT Evaluation $PT Eval Low Complexity: 1 Low PT Treatments $Gait Training: 8-22 mins $Therapeutic Exercise: 8-22 mins        Kreg Shropshire, DPT 04/01/2021, 3:47 PM

## 2021-04-01 NOTE — Anesthesia Preprocedure Evaluation (Addendum)
Anesthesia Evaluation  Patient identified by MRN, date of birth, ID band Patient awake    Reviewed: Allergy & Precautions, H&P , NPO status , Patient's Chart, lab work & pertinent test results, reviewed documented beta blocker date and time   Airway Mallampati: II   Neck ROM: full    Dental  (+) Poor Dentition   Pulmonary sleep apnea and Continuous Positive Airway Pressure Ventilation , pneumonia, resolved,    Pulmonary exam normal        Cardiovascular Exercise Tolerance: Poor hypertension, On Medications negative cardio ROS Normal cardiovascular exam Rhythm:regular Rate:Normal     Neuro/Psych negative neurological ROS  negative psych ROS   GI/Hepatic Neg liver ROS, GERD  Medicated,  Endo/Other  negative endocrine ROS  Renal/GU negative Renal ROS  negative genitourinary   Musculoskeletal   Abdominal   Peds  Hematology negative hematology ROS (+)   Anesthesia Other Findings Past Medical History: No date: Arthritis No date: BPH with obstruction/lower urinary tract symptoms No date: GERD (gastroesophageal reflux disease) No date: Hyperlipidemia     Comment:  taken off simvastatin last year No date: Hypertension No date: Obesity No date: Pneumonia     Comment:  hospitalized  No date: Sleep apnea     Comment:  uses cpap machine  No date: Urinary frequency No date: Urine incontinence Past Surgical History: 02/09/2016: COLONOSCOPY WITH PROPOFOL; N/A     Comment:  Procedure: COLONOSCOPY WITH PROPOFOL;  Surgeon: Hulen Luster, MD;  Location: [facility] ENDOSCOPY;  Service:               Gastroenterology;  Laterality: N/A; 02/09/2016: ESOPHAGOGASTRODUODENOSCOPY (EGD) WITH PROPOFOL; N/A     Comment:  Procedure: ESOPHAGOGASTRODUODENOSCOPY (EGD) WITH               PROPOFOL;  Surgeon: Hulen Luster, MD;  Location: [facility]               ENDOSCOPY;  Service: Gastroenterology;  Laterality: N/A; No date: HERNIA REPAIR      Comment:  1990's - 3 surgeries BMI    Body Mass Index: 30.77 kg/m     Reproductive/Obstetrics negative OB ROS                             Anesthesia Physical Anesthesia Plan  ASA: III  Anesthesia Plan: General and Spinal   Post-op Pain Management:    Induction:   PONV Risk Score and Plan:   Airway Management Planned:   Additional Equipment:   Intra-op Plan:   Post-operative Plan:   Informed Consent: I have reviewed the patients History and Physical, chart, labs and discussed the procedure including the risks, benefits and alternatives for the proposed anesthesia with the patient or authorized representative who has indicated his/her understanding and acceptance.     Dental Advisory Given  Plan Discussed with: CRNA  Anesthesia Plan Comments:         Anesthesia Quick Evaluation

## 2021-04-01 NOTE — Anesthesia Postprocedure Evaluation (Signed)
Anesthesia Post Note  Patient: Brian Pearson  Procedure(s) Performed: COMPUTER ASSISTED TOTAL KNEE ARTHROPLASTY (Left Knee)  Patient location during evaluation: PACU Anesthesia Type: Spinal Level of consciousness: awake and alert Pain management: pain level controlled Vital Signs Assessment: post-procedure vital signs reviewed and stable Respiratory status: spontaneous breathing, nonlabored ventilation, respiratory function stable and patient connected to nasal cannula oxygen Cardiovascular status: blood pressure returned to baseline and stable Postop Assessment: no apparent nausea or vomiting Anesthetic complications: no   No complications documented.   Last Vitals:  Vitals:   04/01/21 1145 04/01/21 1153  BP: 127/64   Pulse: 73 73  Resp: 19 17  Temp:    SpO2: 93% 92%    Last Pain:  Vitals:   04/01/21 1145  TempSrc:   PainSc: 0-No pain                 Molli Barrows

## 2021-04-01 NOTE — Op Note (Signed)
OPERATIVE NOTE  DATE OF SURGERY:  04/01/2021  PATIENT NAME:  DMITRY MACOMBER   DOB: 25-Jul-1936  MRN: [id number]  PRE-OPERATIVE DIAGNOSIS: Degenerative arthrosis of the left knee, primary  POST-OPERATIVE DIAGNOSIS:  Same  PROCEDURE:  Left total knee arthroplasty using computer-assisted navigation  SURGEON:  Marciano Sequin. M.D.  ASSISTANT: Cassell Smiles, PA-C (present and scrubbed throughout the case, critical for assistance with exposure, retraction, instrumentation, and closure)  ANESTHESIA: spinal  ESTIMATED BLOOD LOSS: 1400 mL  FLUIDS REPLACED: 50 mL of crystalloid  TOURNIQUET TIME: 92 minutes  DRAINS: 2 medium Hemovac drains  SOFT TISSUE RELEASES: Anterior cruciate ligament, posterior cruciate ligament, deep medial collateral ligament, patellofemoral ligament  IMPLANTS UTILIZED: DePuy Attune size 9 posterior stabilized femoral component (cemented), size 9 rotating platform tibial component (cemented), 41 mm medialized dome patella (cemented), and a 5 mm stabilized rotating platform polyethylene insert.  INDICATIONS FOR SURGERY: YAZID POP is a 85 y.o. year old male with a long history of progressive knee pain. X-rays demonstrated severe degenerative changes in tricompartmental fashion. The patient had not seen any significant improvement despite conservative nonsurgical intervention. After discussion of the risks and benefits of surgical intervention, the patient expressed understanding of the risks benefits and agree with plans for total knee arthroplasty.   The risks, benefits, and alternatives were discussed at length including but not limited to the risks of infection, bleeding, nerve injury, stiffness, blood clots, the need for revision surgery, cardiopulmonary complications, among others, and they were willing to proceed.  PROCEDURE IN DETAIL: The patient was brought into the operating room and, after adequate spinal anesthesia was achieved, a tourniquet was placed  on the patient's upper thigh. The patient's knee and leg were cleaned and prepped with alcohol and DuraPrep and draped in the usual sterile fashion. A "timeout" was performed as per usual protocol. The lower extremity was exsanguinated using an Esmarch, and the tourniquet was inflated to 300 mmHg. An anterior longitudinal incision was made followed by a standard mid vastus approach. The deep fibers of the medial collateral ligament were elevated in a subperiosteal fashion off of the medial flare of the tibia so as to maintain a continuous soft tissue sleeve. The patella was subluxed laterally and the patellofemoral ligament was incised. Inspection of the knee demonstrated severe degenerative changes with full-thickness loss of articular cartilage. Osteophytes were debrided using a rongeur. Anterior and posterior cruciate ligaments were excised. Two 4.0 mm Schanz pins were inserted in the femur and into the tibia for attachment of the array of trackers used for computer-assisted navigation. Hip center was identified using a circumduction technique. Distal landmarks were mapped using the computer. The distal femur and proximal tibia were mapped using the computer. The distal femoral cutting guide was positioned using computer-assisted navigation so as to achieve a 5 distal valgus cut. The femur was sized and it was felt that a size 9 femoral component was appropriate. A size 9 femoral cutting guide was positioned and the anterior cut was performed and verified using the computer. This was followed by completion of the posterior and chamfer cuts. Femoral cutting guide for the central box was then positioned in the center box cut was performed.  Attention was then directed to the proximal tibia. Medial and lateral menisci were excised. The extramedullary tibial cutting guide was positioned using computer-assisted navigation so as to achieve a 0 varus-valgus alignment and 3 posterior slope. The cut was performed and  verified using the computer. The proximal tibia was  sized and it was felt that a size 9 tibial tray was appropriate. Tibial and femoral trials were inserted followed by insertion of a 5 mm polyethylene insert. This allowed for excellent mediolateral soft tissue balancing both in flexion and in full extension. Finally, the patella was cut and prepared so as to accommodate a 41 mm medialized dome patella. A patella trial was placed and the knee was placed through a range of motion with excellent patellar tracking appreciated. The femoral trial was removed after debridement of posterior osteophytes. The central post-hole for the tibial component was reamed followed by insertion of a keel punch. Tibial trials were then removed. Cut surfaces of bone were irrigated with copious amounts of normal saline using pulsatile lavage and then suctioned dry. Polymethylmethacrylate cement was prepared in the usual fashion using a vacuum mixer. Cement was applied to the cut surface of the proximal tibia as well as along the undersurface of a size 9 rotating platform tibial component. Tibial component was positioned and impacted into place. Excess cement was removed using [employer] fast streamer. Cement was then applied to the cut surfaces of the femur as well as along the posterior flanges of the size 9 femoral component. The femoral component was positioned and impacted into place. Excess cement was removed using [employer] fast streamer. A 5 mm polyethylene trial was inserted and the knee was brought into full extension with steady axial compression applied. Finally, cement was applied to the backside of a 41 mm medialized dome patella and the patellar component was positioned and patellar clamp applied. Excess cement was removed using [employer] fast streamer. After adequate curing of the cement, the tourniquet was deflated after a total tourniquet time of 92 minutes. Hemostasis was achieved using electrocautery. The knee was irrigated with copious amounts  of normal saline using pulsatile lavage followed by 500 ml of Surgiphor and then suctioned dry. 20 mL of 1.3% Exparel and 60 mL of 0.25% Marcaine in 40 mL of normal saline was injected along the posterior capsule, medial and lateral gutters, and along the arthrotomy site. A 5 mm stabilized rotating platform polyethylene insert was inserted and the knee was placed through a range of motion with excellent mediolateral soft tissue balancing appreciated and excellent patellar tracking noted. 2 medium drains were placed in the wound bed and brought out through separate stab incisions. The medial parapatellar portion of the incision was reapproximated using interrupted sutures of #1 Vicryl. Subcutaneous tissue was approximated in layers using first #0 Vicryl followed #2-0 Vicryl. The skin was approximated with skin staples. A sterile dressing was applied.  The patient tolerated the procedure well and was transported to the recovery room in stable condition.    Darci Lykins P. Holley Bouche., M.D.

## 2021-04-01 NOTE — Progress Notes (Signed)
NT bladder scan 631mls. Stood pt up to urinate; voided 11mls. Residual at 434mls. Called Dr. Posey Pronto to inform. Ordered not to I&O unless pt is uncomfortable (he reports no discomfort or sensation to urinate). Will encourage pt to urinate on his own. Will bladder scan again; if amount is >650, will perform I&O per Dr. Serita Grit instruction.

## 2021-04-01 NOTE — Progress Notes (Signed)
Pt admitted to room 156 status post Left TKA. Pt was oriented to room and equipment. Pts questions and concerns addressed at this time. Pt resting comfortably. VSS.    04/01/21 1227  Vitals  Temp (!) 97.5 F (36.4 C)  BP (!) 143/60  MAP (mmHg) 83  BP Location Left Arm  BP Method Automatic  Patient Position (if appropriate) Lying  Pulse Rate 87  Pulse Rate Source Monitor  Resp 18  MEWS COLOR  MEWS Score Color Green  Oxygen Therapy  SpO2 97 %  O2 Device Nasal Cannula

## 2021-04-01 NOTE — Transfer of Care (Signed)
Immediate Anesthesia Transfer of Care Note  Patient: Brian Pearson  Procedure(s) Performed: COMPUTER ASSISTED TOTAL KNEE ARTHROPLASTY (Left Knee)  Patient Location: PACU  Anesthesia Type:Spinal  Level of Consciousness: awake, alert  and oriented  Airway & Oxygen Therapy: Patient Spontanous Breathing and Patient connected to face mask oxygen  Post-op Assessment: Report given to RN and Post -op Vital signs reviewed and stable  Post vital signs: Reviewed and stable  Last Vitals:  Vitals Value Taken Time  BP 103/67 04/01/21 1116  Temp    Pulse 73 04/01/21 1123  Resp 19 04/01/21 1123  SpO2 96 % 04/01/21 1123  Vitals shown include unvalidated device data.  Last Pain:  Vitals:   04/01/21 0616  TempSrc: Temporal  PainSc: 2          Complications: No complications documented.

## 2021-04-01 NOTE — H&P (Signed)
The patient has been re-examined, and the chart reviewed, and there have been no interval changes to the documented history and physical.    The risks, benefits, and alternatives have been discussed at length. The patient expressed understanding of the risks benefits and agreed with plans for surgical intervention.  Brian Pearson P. Kelsy Polack, Jr. M.D.    

## 2021-04-01 NOTE — Anesthesia Procedure Notes (Signed)
Spinal  Patient location during procedure: OR Start time: 04/01/2021 7:18 AM End time: 04/01/2021 7:23 AM Reason for block: surgical anesthesia Staffing Performed: resident/CRNA  Resident/CRNA: Nelda Marseille, CRNA Preanesthetic Checklist Completed: patient identified, IV checked, site marked, risks and benefits discussed, surgical consent, monitors and equipment checked, pre-op evaluation and timeout performed Spinal Block Patient position: sitting Prep: Betadine Patient monitoring: heart rate, continuous pulse ox, blood pressure and cardiac monitor Approach: midline Location: L3-4 Injection technique: single-shot Needle Needle type: Whitacre and Introducer  Needle gauge: 24 G Needle length: 9 cm Assessment Events: CSF return Additional Notes Negative paresthesia. Negative blood return. Positive free-flowing CSF. Expiration date of kit checked and confirmed. Patient tolerated procedure well, without complications.

## 2021-04-01 NOTE — Addendum Note (Signed)
Addendum  created 04/01/21 1710 by Nelda Marseille, CRNA   Intraprocedure Meds edited

## 2021-04-01 NOTE — Anesthesia Procedure Notes (Signed)
Date/Time: 04/01/2021 7:30 AM Performed by: Nelda Marseille, CRNA Pre-anesthesia Checklist: Patient identified, Emergency Drugs available, Suction available, Patient being monitored and Timeout performed Oxygen Delivery Method: Simple face mask

## 2021-04-01 NOTE — Plan of Care (Signed)

## 2021-04-02 ENCOUNTER — Encounter: Payer: Self-pay | Admitting: Orthopedic Surgery

## 2021-04-02 DIAGNOSIS — R531 Weakness: Secondary | ICD-10-CM | POA: Diagnosis not present

## 2021-04-02 DIAGNOSIS — G4733 Obstructive sleep apnea (adult) (pediatric): Secondary | ICD-10-CM | POA: Diagnosis not present

## 2021-04-02 DIAGNOSIS — M1712 Unilateral primary osteoarthritis, left knee: Secondary | ICD-10-CM | POA: Diagnosis not present

## 2021-04-02 MED ORDER — CELECOXIB 200 MG PO CAPS: 200.0000 mg | ORAL_CAPSULE | Freq: Two times a day (BID) | ORAL | 0 refills | Status: DC

## 2021-04-02 MED ORDER — ENOXAPARIN SODIUM 40 MG/0.4ML ~~LOC~~ SOLN: 40.0000 mg | SUBCUTANEOUS | 0 refills | Status: DC

## 2021-04-02 MED ORDER — OXYCODONE HCL 5 MG PO TABS: 5.0000 mg | ORAL_TABLET | ORAL | 0 refills | Status: DC | PRN

## 2021-04-02 NOTE — Evaluation (Signed)
Occupational Therapy Evaluation Patient Details Name: Brian Pearson MRN: [id number] DOB: 02/26/1936 Today's Date: 04/02/2021    History of Present Illness 85 y/o male s/p L TKA 4/13.   Clinical Impression   Pt seen for OT evaluation this date, POD#1 from above surgery. Pt was independent in all ADLs and functional mobility prior to surgery, however occasionally limited due to L knee pain. Pt is eager to return to PLOF with less pain and improved safety and independence. Due to pain and limited AROM of L knee, pt currently requires SUPERVISION for functional mobility of short household distances with RW, MIN GUARD (and verbal cues for safe hand placement) during toilet transfer, SUPERVISION for seated peri-care, and SUPERVISION for standing grooming tasks. Pt instructed in polar care mgt, falls prevention strategies, home/routines modifications, and DME/AE for LB bathing and dressing tasks; handout provided. Pt would benefit from skilled OT services including additional instruction in techniques, with or without assistive devices, for dressing and bathing skills to support recall and carryover prior to discharge and ultimately to maximize safety, independence, and minimize falls risk and caregiver burden. Recommend home with home health OT and supervision/assistance from family as needed     Follow Up Recommendations  Home health OT;Supervision - Intermittent    Equipment Recommendations  3 in 1 bedside commode       Precautions / Restrictions Precautions Precautions: Fall Restrictions Weight Bearing Restrictions: Yes LLE Weight Bearing: Weight bearing as tolerated      Mobility Bed Mobility Overal bed mobility: Modified Independent             General bed mobility comments: Not assessed. Pt in recliner upon arrrival    Transfers Overall transfer level: Needs assistance Equipment used: Rolling walker (2 wheeled) Transfers: Sit to/from Stand Sit to Stand: Supervision          General transfer comment: Required verbal cues for safe hand placement during x3 sit>stand transfers    Balance Overall balance assessment: Needs assistance Sitting-balance support: No upper extremity supported;Feet supported Sitting balance-Leahy Scale: Good     Standing balance support: No upper extremity supported;During functional activity Standing balance-Leahy Scale: Good Standing balance comment: Good standing balance during standing grooming tasks                           ADL either performed or assessed with clinical judgement   ADL Overall ADL's : Needs assistance/impaired     Grooming: Wash/dry hands;Set up;Standing                   Toilet Transfer: Min guard;Regular Toilet;Grab bars;RW   Toileting- Clothing Manipulation and Hygiene: Supervision/safety;Sitting/lateral lean       Functional mobility during ADLs: Supervision/safety;Rolling walker       Vision Baseline Vision/History: Wears glasses Wears Glasses: Reading only Patient Visual Report: No change from baseline              Pertinent Vitals/Pain Pain Assessment: No/denies pain Pain Score: 2  (increases with ROM tasks) Pain Location: L knee        Extremity/Trunk Assessment Upper Extremity Assessment Upper Extremity Assessment: Overall WFL for tasks assessed   Lower Extremity Assessment Lower Extremity Assessment: Defer to PT evaluation       Communication Communication Communication: No difficulties;HOH   Cognition Arousal/Alertness: Awake/alert Behavior During Therapy: WFL for tasks assessed/performed Overall Cognitive Status: Within Functional Limits for tasks assessed  Exercises  Other Exercises: Pt instructed in polar care mgt, falls prevention strategies, home/routines modifications, and DME/AE for LB bathing and dressing tasks; handout provided and pt/family able to teach-back education provided  regarding polar care management at end of session        [place] expects to be discharged to:: Private residence Living Arrangements: Spouse/significant other Available Help at Discharge: Family;Available 24 hours/day Type of Home: House (Condo at [employer]) Home Access: Other (comment) (small threshold)     Home Layout: One level     Bathroom Shower/Tub: Tub/shower unit         Home Equipment: None;Grab bars - tub/shower;Hand held shower head          Prior Functioning/Environment Level of Independence: Independent        Comments: Pt reports that he gets out in the community, drives, can manage ADLs        OT Problem List: Decreased range of motion;Impaired balance (sitting and/or standing)      OT Treatment/Interventions: Self-care/ADL training;Therapeutic exercise;Energy conservation;DME and/or AE instruction;Therapeutic activities;Patient/family education;Balance training    OT Goals(Current goals can be found in the care plan section) Acute Rehab OT Goals Patient Stated Goal: to go home OT Goal Formulation: With patient Time For Goal Achievement: 04/16/21 Potential to Achieve Goals: Good ADL Goals Pt Will Perform Grooming: with modified independence;standing Pt Will Perform Lower Body Dressing: with modified independence;sit to/from stand Pt Will Transfer to Toilet: with modified independence;ambulating;regular height toilet  OT Frequency: Min 1X/week    AM-PAC OT "6 Clicks" Daily Activity     Outcome Measure Help from another person eating meals?: None Help from another person taking care of personal grooming?: A Little Help from another person toileting, which includes using toliet, bedpan, or urinal?: A Little Help from another person bathing (including washing, rinsing, drying)?: A Little Help from another person to put on and taking off regular upper body clothing?: A Little Help from another person to put on and taking off  regular lower body clothing?: A Lot 6 Click Score: 18   End of Session Equipment Utilized During Treatment: Rolling walker Nurse Communication: Mobility status  Activity Tolerance: Patient tolerated treatment well Patient left: in chair;with call bell/phone within reach;with chair alarm set;with family/visitor present  OT Visit Diagnosis: Unsteadiness on feet (R26.81)                Time: 5056-9794 OT Time Calculation (min): 32 min Charges:  OT General Charges $OT Visit: 1 Visit OT Evaluation $OT Eval Moderate Complexity: 1 Mod OT Treatments $Self Care/Home Management : 23-37 mins  Fredirick Maudlin, OTR/L [place]

## 2021-04-02 NOTE — Discharge Summary (Signed)
Physician Discharge Summary  Patient ID: Brian Pearson MRN: [id number] DOB/AGE: [birth year] 83 y.o.  Admit date: 04/01/2021 Discharge date: 04/02/2021  Admission Diagnoses:  Total knee replacement status [Z96.659]  Surgeries:Procedure(s): Left total knee arthroplasty using computer-assisted navigation  SURGEON:  Marciano Sequin. M.D.  ASSISTANT: Cassell Smiles, PA-C (present and scrubbed throughout the case, critical for assistance with exposure, retraction, instrumentation, and closure)  ANESTHESIA: spinal  ESTIMATED BLOOD LOSS: 1400 mL  FLUIDS REPLACED: 50 mL of crystalloid  TOURNIQUET TIME: 92 minutes  DRAINS: 2 medium Hemovac drains  SOFT TISSUE RELEASES: Anterior cruciate ligament, posterior cruciate ligament, deep medial collateral ligament, patellofemoral ligament  IMPLANTS UTILIZED: DePuy Attune size 9 posterior stabilized femoral component (cemented), size 9 rotating platform tibial component (cemented), 41 mm medialized dome patella (cemented), and a 5 mm stabilized rotating platform polyethylene insert.  Discharge Diagnoses: Patient Active Problem List   Diagnosis Date Noted  . Elevated blood-pressure reading without diagnosis of hypertension 04/01/2021  . Total knee replacement status 04/01/2021  . Hearing loss 01/14/2021  . Hand tingling 09/27/2020  . Memory change 05/02/2020  . Right knee pain 02/27/2020  . Health care maintenance 01/10/2020  . Left knee pain 10/06/2019  . Neck pain 07/29/2019  . Swelling of right lower extremity 07/29/2019  . OSA on CPAP 04/07/2019  . URI (upper respiratory infection) 10/07/2016  . Prostate cancer screening 04/06/2016  . Essential hypertension 01/02/2016  . Episodic lightheadedness 01/02/2016  . Muscle cramp 01/02/2016  . Dysphagia 01/02/2016  . Pain in joint, pelvic region and thigh 02/21/2015  . Urinary hesitancy 02/21/2015  . Hyperlipidemia 10/22/2014  . Screening for skin cancer 10/22/2014  . Benign  prostatic hyperplasia without urinary obstruction 06/03/2014  . HLD (hyperlipidemia) 06/03/2014    Past Medical History:  Diagnosis Date  . Arthritis   . BPH with obstruction/lower urinary tract symptoms   . GERD (gastroesophageal reflux disease)   . Hyperlipidemia    taken off simvastatin last year  . Hypertension   . Obesity   . Pneumonia    hospitalized   . Sleep apnea    uses cpap machine   . Urinary frequency   . Urine incontinence      Transfusion:    Consultants (if any):   Discharged Condition: Improved  Hospital Course: TOSH GLAZE is an 85 y.o. male who was admitted 04/01/2021 with a diagnosis of left knee osteoarthritis and went to the operating room on 04/01/2021 and underwent left total knee arthoplasty. The patient received perioperative antibiotics for prophylaxis (see below). The patient tolerated the procedure well and was transported to PACU in stable condition. After meeting PACU criteria, the patient was subsequently transferred to the Orthopaedics/Rehabilitation unit.   The patient received DVT prophylaxis in the form of early mobilization, Lovenox, Foot Pumps and TED hose. A sacral pad had been placed and heels were elevated off of the bed with rolled towels in order to protect skin integrity. Foley catheter was discontinued on postoperative day #0. Wound drains were discontinued on postoperative day #1. The surgical incision was healing well without signs of infection.  Physical therapy was initiated postoperatively for transfers, gait training, and strengthening. Occupational therapy was initiated for activities of daily living and evaluation for assisted devices. Rehabilitation goals were reviewed in detail with the patient. The patient made steady progress with physical therapy and physical therapy recommended discharge to Home.   The patient achieved the preliminary goals of this hospitalization and was felt to be medically and  orthopaedically  appropriate for discharge.  He was given perioperative antibiotics:  Anti-infectives (From admission, onward)   Start     Dose/Rate Route Frequency Ordered Stop   04/01/21 1315  ceFAZolin (ANCEF) IVPB 2g/100 mL premix        2 g 200 mL/hr over 30 Minutes Intravenous Every 6 hours 04/01/21 1228 04/01/21 2042   04/01/21 0631  ceFAZolin (ANCEF) 2-4 GM/100ML-% IVPB       Note to Pharmacy: Norton Blizzard  : cabinet override      04/01/21 0631 04/01/21 1512   04/01/21 0600  ceFAZolin (ANCEF) IVPB 2g/100 mL premix        2 g 200 mL/hr over 30 Minutes Intravenous On call to O.R. 04/01/21 1610 04/01/21 0750    .  Recent vital signs:  Vitals:   04/02/21 0821 04/02/21 0823  BP: (!) 149/73 (!) 149/73  Pulse:  78  Resp:  16  Temp:  97.6 F (36.4 C)  SpO2:  93%    Recent laboratory studies:  No results for input(s): WBC, HGB, HCT, PLT, K, CL, CO2, BUN, CREATININE, GLUCOSE, CALCIUM, LABPT, INR in the last 72 hours.  Diagnostic Studies: DG Knee Left Port  Result Date: 04/01/2021 CLINICAL DATA:  Total left knee replacement EXAM: PORTABLE LEFT KNEE - 1-2 VIEW COMPARISON:  None. FINDINGS: Changes of left knee replacement. Soft tissue drain in place. No hardware bony complicating feature. IMPRESSION: Left knee replacement.  No visible complicating feature. Electronically Signed   By: Rolm Baptise M.D.   On: 04/01/2021 11:44    Discharge Medications:   Allergies as of 04/02/2021   No Known Allergies     Medication List    STOP taking these medications   aspirin EC 81 MG tablet     TAKE these medications   amLODipine 10 MG tablet Commonly known as: NORVASC Take 1 tablet by mouth once daily   celecoxib 200 MG capsule Commonly known as: CELEBREX Take 1 capsule (200 mg total) by mouth 2 (two) times daily.   COQ10 PO Take 1 capsule by mouth daily.   enoxaparin 40 MG/0.4ML injection Commonly known as: LOVENOX Inject 0.4 mLs (40 mg total) into the skin daily for 14 days.    fesoterodine 4 MG Tb24 tablet Commonly known as: Toviaz Take 1 tablet (4 mg total) by mouth daily.   finasteride 5 MG tablet Commonly known as: PROSCAR Take 1 tablet (5 mg total) by mouth daily.   FISH OIL PO Take 1 capsule by mouth daily.   losartan 25 MG tablet Commonly known as: COZAAR Take 1 tablet by mouth once daily   Multi-Vitamins Tabs Take 1 tablet by mouth daily.   omeprazole 20 MG capsule Commonly known as: PRILOSEC Take 1 capsule by mouth once daily   oxyCODONE 5 MG immediate release tablet Commonly known as: Oxy IR/ROXICODONE Take 1 tablet (5 mg total) by mouth every 4 (four) hours as needed for moderate pain (pain score 4-6).   simvastatin 20 MG tablet Commonly known as: ZOCOR TAKE 1 TABLET BY MOUTH AT BEDTIME NEEDS  APPOINTMENT   tamsulosin 0.4 MG Caps capsule Commonly known as: FLOMAX Take 1 capsule (0.4 mg total) by mouth daily.   Vitamin D-3 25 MCG (1000 UT) Caps Take 1,000 Units by mouth daily.            Durable Medical Equipment  (From admission, onward)         Start     Ordered   04/01/21 1229  DME Walker rolling  Once       Question:  Patient needs a walker to treat with the following condition  Answer:  Total knee replacement status   04/01/21 1228   04/01/21 1229  DME Bedside commode  Once       Question:  Patient needs a bedside commode to treat with the following condition  Answer:  Total knee replacement status   04/01/21 1228          Disposition: Home with home health PT     Follow-up Information    [facility] On 04/16/2021.   Specialty: Orthopedic Surgery Why: at 1:15pm Contact information: [place] [address] [phone]        Dereck Leep, MD On 05/14/2021.   Specialty: Orthopedic Surgery Why: at 2:15pm Contact information: [place] [address] [place], PA-C 04/02/2021, 11:11 AM

## 2021-04-02 NOTE — Progress Notes (Signed)
Pt provided discharge instructions and all questions and concerns addressed. All medications have been obtained by pts wife. Tedhose applied to BLE and polar care, as well as all DME equipment sent home with pt. Foley catheter inserted prior to discharge and pt and wife educated on foley care and how to empty bag. Leg bag provided. Pt instructed to follow up with urology on ASAP. Pts mode of transport at discharge is wheelchair accompanied by wife.   04/02/21 1633  Vitals  Temp 98.7 F (37.1 C)  BP (!) 153/68  MAP (mmHg) 92  BP Location Left Arm  BP Method Automatic  Patient Position (if appropriate) Sitting  Pulse Rate 78  Pulse Rate Source Monitor  Resp 16  MEWS COLOR  MEWS Score Color Green  Oxygen Therapy  SpO2 96 %  O2 Device Room Air

## 2021-04-02 NOTE — Progress Notes (Signed)
Physical Therapy Treatment Patient Details Name: Brian Pearson MRN: [id number] DOB: [birth year] Today's Date: 04/02/2021    History of Present Illness 85 y/o male s/p L TKA 4/13.    PT Comments    Pt continues to do very well s/p TKA with >90* flexion, solid quad set and SLRs, able to circumambulate the nurses' station and negotiate up/down steps and having very manageable pain even with activity/WBing/ROM.  Pt doing well and is safe to go home when medically ready for d/c.  Follow Up Recommendations  Home health PT;Follow surgeon's recommendation for DC plan and follow-up therapies     Equipment Recommendations  Rolling walker with 5" wheels;3in1 (PT)    Recommendations for Other Services       Precautions / Restrictions Precautions Precautions: Fall Restrictions Weight Bearing Restrictions: Yes LLE Weight Bearing: Weight bearing as tolerated    Mobility  Bed Mobility Overal bed mobility: Modified Independent                  Transfers Overall transfer level: Modified independent Equipment used: Rolling walker (2 wheeled)             General transfer comment: Minimal cuing for set up and hand placement, able to rise w/o assist  Ambulation/Gait Ambulation/Gait assistance: Supervision Gait Distance (Feet): 250 Feet Assistive device: Rolling walker (2 wheeled)       General Gait Details: Again able to quickly assume consistent cadence and walker momentum and no hesitancy with WBing on the L and good overall confidence and high 90s.   Stairs Stairs: Yes Stairs assistance: Supervision Stair Management: Two rails Number of Stairs: 4 General stair comments: Pt did well with stair negotiation, no safety issues and good safety/confidence with   Wheelchair Mobility    Modified Rankin (Stroke Patients Only)       Balance Overall balance assessment: Independent                                          Cognition  Arousal/Alertness: Awake/alert Behavior During Therapy: WFL for tasks assessed/performed Overall Cognitive Status: Within Functional Limits for tasks assessed                                        Exercises Total Joint Exercises Ankle Circles/Pumps: AROM;20 reps Quad Sets: Strengthening;10 reps Short Arc Quad: Strengthening;10 reps Heel Slides: Strengthening;10 reps (with resisted leg ext) Hip ABduction/ADduction: Strengthening;10 reps Straight Leg Raises: Strengthening;10 reps Knee Flexion: PROM;5 reps Goniometric ROM: 0-92    General Comments        Pertinent Vitals/Pain Pain Assessment: 0-10 Pain Score: 2  (increases with ROM tasks) Pain Location: L knee    Home Living Family/patient expects to be discharged to:: Private residence Living Arrangements: Spouse/significant other Available Help at Discharge: Family;Available 24 hours/day Type of Home: House (Condo at [employer]) Home Access:  (small threshold)   Home Layout: One level Home Equipment: None;Grab bars - tub/shower;Hand held shower head      Prior Function Level of Independence: Independent      Comments: Pt reports that he gets out in the community, drives, can manage ADLs   PT Goals (current goals can now be found in the care plan section) Progress towards PT goals: Progressing toward goals    Frequency  BID      PT Plan Current plan remains appropriate    Co-evaluation              AM-PAC PT "6 Clicks" Mobility   Outcome Measure  Help needed turning from your back to your side while in a flat bed without using bedrails?: None Help needed moving from lying on your back to sitting on the side of a flat bed without using bedrails?: None Help needed moving to and from a bed to a chair (including a wheelchair)?: None Help needed standing up from a chair using your arms (e.g., wheelchair or bedside chair)?: None Help needed to walk in hospital room?: None Help needed  climbing 3-5 steps with a railing? : None 6 Click Score: 24    End of Session Equipment Utilized During Treatment: Gait belt Activity Tolerance: Patient tolerated treatment well Patient left: in chair;with call bell/phone within reach;with nursing/sitter in room Nurse Communication: Mobility status PT Visit Diagnosis: Muscle weakness (generalized) (M62.81);Difficulty in walking, not elsewhere classified (R26.2);Pain Pain - Right/Left: Left Pain - part of body: Knee     Time: 7902-4097 PT Time Calculation (min) (ACUTE ONLY): 31 min  Charges:  $Gait Training: 8-22 mins $Therapeutic Activity: 8-22 mins                     Kreg Shropshire, DPT 04/02/2021, 10:53 AM

## 2021-04-02 NOTE — Progress Notes (Signed)
  Subjective: 1 Day Post-Op Procedure(s) (LRB): COMPUTER ASSISTED TOTAL KNEE ARTHROPLASTY (Left) Patient reports pain as well-controlled.   Patient is well, and has had no acute complaints or problems Plan is to go Home after hospital stay. Negative for chest pain and shortness of breath Fever: no Gastrointestinal: negative for nausea and vomiting.   Patient has not had a bowel movement.  Objective: Vital signs in last 24 hours: Temp:  [97.5 F (36.4 C)-98.3 F (36.8 C)] 97.5 F (36.4 C) (04/14 0348) Pulse Rate:  [73-88] 73 (04/14 0348) Resp:  [10-20] 18 (04/14 0348) BP: (103-145)/(57-70) 145/68 (04/14 0348) SpO2:  [92 %-98 %] 95 % (04/14 0348)  Intake/Output from previous day:  Intake/Output Summary (Last 24 hours) at 04/02/2021 0817 Last data filed at 04/02/2021 0402 Gross per 24 hour  Intake 2760 ml  Output 2215 ml  Net 545 ml    Intake/Output this shift: No intake/output data recorded.  Labs: No results for input(s): HGB in the last 72 hours. No results for input(s): WBC, RBC, HCT, PLT in the last 72 hours. No results for input(s): NA, K, CL, CO2, BUN, CREATININE, GLUCOSE, CALCIUM in the last 72 hours. No results for input(s): LABPT, INR in the last 72 hours.   EXAM General - Patient is Alert, Appropriate and Oriented Extremity - Neurovascular intact Dorsiflexion/Plantar flexion intact Compartment soft Dressing/Incision -Postoperative dressing remains in place., Polar Care in place and working. , Hemovac in place.  Motor Function - intact, moving foot and toes well on exam.  Cardiovascular- Regular rate and rhythm, no murmurs/rubs/gallops Respiratory- Lungs clear to auscultation bilaterally Gastrointestinal- soft, nontender and active bowel sounds   Assessment/Plan: 1 Day Post-Op Procedure(s) (LRB): COMPUTER ASSISTED TOTAL KNEE ARTHROPLASTY (Left) Active Problems:   Total knee replacement status  Estimated body mass index is 30.77 kg/m as calculated from  the following:   Height as of this encounter: 6' (1.829 m).   Weight as of this encounter: 102.9 kg. Advance diet Up with therapy  Tentative plans for d/c today pending completion of therapy goals.   DVT Prophylaxis - Lovenox, Ted hose and foot pumps Weight-Bearing as tolerated to left leg  Cassell Smiles, PA-C [facility] Orthopaedic Surgery 04/02/2021, 8:17 AM

## 2021-04-02 NOTE — Progress Notes (Signed)
Physical Therapy Treatment Patient Details Name: Brian Pearson MRN: [id number] DOB: 17-Nov-1936 Today's Date: 04/02/2021    History of Present Illness 85 y/o male s/p L TKA 4/13.    PT Comments    Pt continues to do very well with all aspects of PT.  He confidently ambulated with every improving speed, cadence and L knee mechanics (he did need a few reminders to stay inside the walker during turns).  He showed good confidence with multiple sit to stands and continues to show good quad control and strength.      Follow Up Recommendations  Home health PT;Follow surgeon's recommendation for DC plan and follow-up therapies     Equipment Recommendations  Rolling walker with 5" wheels;3in1 (PT)    Recommendations for Other Services       Precautions / Restrictions Precautions Precautions: Fall Restrictions Weight Bearing Restrictions: Yes LLE Weight Bearing: Weight bearing as tolerated    Mobility  Bed Mobility Overal bed mobility: Modified Independent             General bed mobility comments: good recall of UE use and positioning    Transfers Overall transfer level: Needs assistance Equipment used: Rolling walker (2 wheeled) Transfers: Sit to/from Stand Sit to Stand: Supervision         General transfer comment: Required verbal cues for safe hand placement during x3 sit>stand transfers  Ambulation/Gait Ambulation/Gait assistance: Supervision Gait Distance (Feet): 200 Feet Assistive device: Rolling walker (2 wheeled)       General Gait Details: Pt with less use of UEs on the walker with good safety.  He was able show increased speed and improved L knee mechanics.   Stairs             Wheelchair Mobility    Modified Rankin (Stroke Patients Only)       Balance Overall balance assessment: Needs assistance Sitting-balance support: No upper extremity supported;Feet supported Sitting balance-Leahy Scale: Good     Standing balance support:  Bilateral upper extremity supported Standing balance-Leahy Scale: Good Standing balance comment: Good standing balance during standing grooming tasks                            Cognition Arousal/Alertness: Awake/alert Behavior During Therapy: WFL for tasks assessed/performed Overall Cognitive Status: Within Functional Limits for tasks assessed                                        Exercises Total Joint Exercises Quad Sets: Strengthening;10 reps Short Arc Quad: Strengthening;15 reps Heel Slides: Strengthening;10 reps Straight Leg Raises: Strengthening;10 reps Knee Flexion: PROM;5 reps Other Exercises Other Exercises: Pt instructed in polar care mgt, falls prevention strategies, home/routines modifications, and DME/AE for LB bathing and dressing tasks; handout provided.    General Comments        Pertinent Vitals/Pain Pain Assessment: 0-10 Pain Score: 4     Home Living                      Prior Function            PT Goals (current goals can now be found in the care plan section) Acute Rehab PT Goals Patient Stated Goal: to go home Progress towards PT goals: Progressing toward goals    Frequency    BID  PT Plan Current plan remains appropriate    Co-evaluation              AM-PAC PT "6 Clicks" Mobility   Outcome Measure  Help needed turning from your back to your side while in a flat bed without using bedrails?: None Help needed moving from lying on your back to sitting on the side of a flat bed without using bedrails?: None Help needed moving to and from a bed to a chair (including a wheelchair)?: None Help needed standing up from a chair using your arms (e.g., wheelchair or bedside chair)?: None Help needed to walk in hospital room?: None Help needed climbing 3-5 steps with a railing? : None 6 Click Score: 24    End of Session Equipment Utilized During Treatment: Gait belt Activity Tolerance: Patient  tolerated treatment well Patient left: in chair;with call bell/phone within reach;with nursing/sitter in room   PT Visit Diagnosis: Muscle weakness (generalized) (M62.81);Difficulty in walking, not elsewhere classified (R26.2);Pain Pain - Right/Left: Left Pain - part of body: Knee     Time: 7209-4709 PT Time Calculation (min) (ACUTE ONLY): 26 min  Charges:  $Gait Training: 8-22 mins $Therapeutic Exercise: 8-22 mins                     Kreg Shropshire, DPT 04/02/2021, 2:49 PM

## 2021-04-02 NOTE — Progress Notes (Signed)
Met with the patient at the bedside to discuss DC plan and needs He lives at home with his spouse He needs a RW and a 3 in 1, I notified Adpat Suanne Marker He is set up with [facility] PT He can afford his medications He has transportation to and from the Dr

## 2021-04-02 NOTE — Progress Notes (Signed)
Patient ambulated to bathroom approx. 45ft. Gait slow; steady; purposeful; satisfactorily. Back to bed with foam bone in place. JP drain emptied and recharged. Patient resting comfortably. Will continue to monitor.

## 2021-04-03 DIAGNOSIS — H9190 Unspecified hearing loss, unspecified ear: Secondary | ICD-10-CM | POA: Diagnosis not present

## 2021-04-03 DIAGNOSIS — R1310 Dysphagia, unspecified: Secondary | ICD-10-CM | POA: Diagnosis not present

## 2021-04-03 DIAGNOSIS — N401 Enlarged prostate with lower urinary tract symptoms: Secondary | ICD-10-CM | POA: Diagnosis not present

## 2021-04-03 DIAGNOSIS — N39498 Other specified urinary incontinence: Secondary | ICD-10-CM | POA: Diagnosis not present

## 2021-04-03 DIAGNOSIS — K2100 Gastro-esophageal reflux disease with esophagitis, without bleeding: Secondary | ICD-10-CM | POA: Diagnosis not present

## 2021-04-03 DIAGNOSIS — Z8701 Personal history of pneumonia (recurrent): Secondary | ICD-10-CM | POA: Diagnosis not present

## 2021-04-03 DIAGNOSIS — I10 Essential (primary) hypertension: Secondary | ICD-10-CM | POA: Diagnosis not present

## 2021-04-03 DIAGNOSIS — Z7901 Long term (current) use of anticoagulants: Secondary | ICD-10-CM | POA: Diagnosis not present

## 2021-04-03 DIAGNOSIS — Z96652 Presence of left artificial knee joint: Secondary | ICD-10-CM | POA: Diagnosis not present

## 2021-04-03 DIAGNOSIS — Z6830 Body mass index (BMI) 30.0-30.9, adult: Secondary | ICD-10-CM | POA: Diagnosis not present

## 2021-04-03 DIAGNOSIS — Z471 Aftercare following joint replacement surgery: Secondary | ICD-10-CM | POA: Diagnosis not present

## 2021-04-03 DIAGNOSIS — E669 Obesity, unspecified: Secondary | ICD-10-CM | POA: Diagnosis not present

## 2021-04-03 DIAGNOSIS — E785 Hyperlipidemia, unspecified: Secondary | ICD-10-CM | POA: Diagnosis not present

## 2021-04-03 DIAGNOSIS — G4733 Obstructive sleep apnea (adult) (pediatric): Secondary | ICD-10-CM | POA: Diagnosis not present

## 2021-04-07 DIAGNOSIS — Z471 Aftercare following joint replacement surgery: Secondary | ICD-10-CM | POA: Diagnosis not present

## 2021-04-07 DIAGNOSIS — Z6830 Body mass index (BMI) 30.0-30.9, adult: Secondary | ICD-10-CM | POA: Diagnosis not present

## 2021-04-07 DIAGNOSIS — R1310 Dysphagia, unspecified: Secondary | ICD-10-CM | POA: Diagnosis not present

## 2021-04-07 DIAGNOSIS — N39498 Other specified urinary incontinence: Secondary | ICD-10-CM | POA: Diagnosis not present

## 2021-04-07 DIAGNOSIS — K2100 Gastro-esophageal reflux disease with esophagitis, without bleeding: Secondary | ICD-10-CM | POA: Diagnosis not present

## 2021-04-07 DIAGNOSIS — H9190 Unspecified hearing loss, unspecified ear: Secondary | ICD-10-CM | POA: Diagnosis not present

## 2021-04-07 DIAGNOSIS — Z7901 Long term (current) use of anticoagulants: Secondary | ICD-10-CM | POA: Diagnosis not present

## 2021-04-07 DIAGNOSIS — N401 Enlarged prostate with lower urinary tract symptoms: Secondary | ICD-10-CM | POA: Diagnosis not present

## 2021-04-07 DIAGNOSIS — I10 Essential (primary) hypertension: Secondary | ICD-10-CM | POA: Diagnosis not present

## 2021-04-07 DIAGNOSIS — Z96652 Presence of left artificial knee joint: Secondary | ICD-10-CM | POA: Diagnosis not present

## 2021-04-07 DIAGNOSIS — Z8701 Personal history of pneumonia (recurrent): Secondary | ICD-10-CM | POA: Diagnosis not present

## 2021-04-07 DIAGNOSIS — E669 Obesity, unspecified: Secondary | ICD-10-CM | POA: Diagnosis not present

## 2021-04-07 DIAGNOSIS — E785 Hyperlipidemia, unspecified: Secondary | ICD-10-CM | POA: Diagnosis not present

## 2021-04-07 DIAGNOSIS — G4733 Obstructive sleep apnea (adult) (pediatric): Secondary | ICD-10-CM | POA: Diagnosis not present

## 2021-04-08 DIAGNOSIS — G4733 Obstructive sleep apnea (adult) (pediatric): Secondary | ICD-10-CM | POA: Diagnosis not present

## 2021-04-13 ENCOUNTER — Other Ambulatory Visit: Payer: Self-pay

## 2021-04-13 ENCOUNTER — Emergency Department
Admission: EM | Admit: 2021-04-13 | Discharge: 2021-04-13 | Disposition: A | Discharge status: Home / Self Care | Payer: PPO | Attending: Emergency Medicine | Admitting: Emergency Medicine

## 2021-04-13 DIAGNOSIS — R42 Dizziness and giddiness: Secondary | ICD-10-CM | POA: Insufficient documentation

## 2021-04-13 DIAGNOSIS — R531 Weakness: Secondary | ICD-10-CM | POA: Diagnosis not present

## 2021-04-13 DIAGNOSIS — I10 Essential (primary) hypertension: Secondary | ICD-10-CM | POA: Insufficient documentation

## 2021-04-13 DIAGNOSIS — Z79899 Other long term (current) drug therapy: Secondary | ICD-10-CM | POA: Insufficient documentation

## 2021-04-13 DIAGNOSIS — Z96652 Presence of left artificial knee joint: Secondary | ICD-10-CM | POA: Insufficient documentation

## 2021-04-13 DIAGNOSIS — R0902 Hypoxemia: Secondary | ICD-10-CM | POA: Diagnosis not present

## 2021-04-13 DIAGNOSIS — R55 Syncope and collapse: Secondary | ICD-10-CM

## 2021-04-13 DIAGNOSIS — R231 Pallor: Secondary | ICD-10-CM | POA: Diagnosis not present

## 2021-04-13 DIAGNOSIS — I959 Hypotension, unspecified: Secondary | ICD-10-CM | POA: Diagnosis not present

## 2021-04-13 LAB — BASIC METABOLIC PANEL
Anion gap: 7 (ref 5–15)
BUN: 19 mg/dL (ref 8–23)
CO2: 24 mmol/L (ref 22–32)
Calcium: 8.3 mg/dL — ABNORMAL LOW (ref 8.9–10.3)
Chloride: 108 mmol/L (ref 98–111)
Creatinine, Ser: 0.91 mg/dL (ref 0.61–1.24)
GFR, Estimated: >60 mL/min (ref >60)
Glucose, Bld: 126 mg/dL — ABNORMAL HIGH (ref 70–99)
Potassium: 3.8 mmol/L (ref 3.5–5.1)
Sodium: 139 mmol/L (ref 135–145)

## 2021-04-13 LAB — CBC WITH DIFFERENTIAL/PLATELET
Abs Immature Granulocytes: 0.04 10*3/uL (ref 0.00–0.07)
Basophils Absolute: 0 10*3/uL (ref 0.0–0.1)
Basophils Relative: 1 %
Eosinophils Absolute: 0.2 10*3/uL (ref 0.0–0.5)
Eosinophils Relative: 2 %
HCT: 33.1 % — ABNORMAL LOW (ref 39.0–52.0)
Hemoglobin: 10.8 g/dL — ABNORMAL LOW (ref 13.0–17.0)
Immature Granulocytes: 1 %
Lymphocytes Relative: 12 %
Lymphs Abs: 1.1 10*3/uL (ref 0.7–4.0)
MCH: 31.2 pg (ref 26.0–34.0)
MCHC: 32.6 g/dL (ref 30.0–36.0)
MCV: 95.7 fL (ref 80.0–100.0)
Monocytes Absolute: 0.6 10*3/uL (ref 0.1–1.0)
Monocytes Relative: 7 %
Neutro Abs: 7 10*3/uL (ref 1.7–7.7)
Neutrophils Relative %: 77 %
Platelets: 338 10*3/uL (ref 150–400)
RBC: 3.46 MIL/uL — ABNORMAL LOW (ref 4.22–5.81)
RDW: 13.2 % (ref 11.5–15.5)
WBC: 8.9 10*3/uL (ref 4.0–10.5)
nRBC: 0 % (ref 0.0–0.2)

## 2021-04-13 NOTE — ED Triage Notes (Signed)
Pt brought in via EMS from home with near syncopal episode.  Pt reports knee surgery on 4/13 and was sent home with foley cath.  Pt states the cath became painful and wife was trying to adjust the catheter when he became clammy and lethargic, he states he nearly passed out.   EMS reports BP on scene was 80/40.  CBG 189

## 2021-04-13 NOTE — ED Provider Notes (Signed)
[facility] Emergency Department Provider Note ____________________________________________   Event Date/Time   First MD Initiated Contact with Patient 04/13/21 [phone]     (approximate)  I have reviewed the triage vital signs and the nursing notes.  HISTORY  Chief Complaint Near Syncope   HPI ALVAN CULPEPPER is a 85 y.o. malewho presents to the ED for evaluation of near syncope.   Chart review indicates history of HTN, HLD and BPH. Patient had left-sided TKA performed on 4/13 by Dr. Marry Guan. Postoperative urinary retention requiring first-time indwelling Foley catheter placement.  Patient reports being scheduled to follow-up with urology tomorrow as an outpatient.  Patient reports difficulty with the StatLock/sticker that holds the catheter against his leg.  Reports it has not been sticking appropriately  Patient reports his wife was messing with it this morning, causing urethral meatus pain.  Concurrent with this pain, patient reports developing lightheaded dizziness and presyncope sensation.  Denies full syncope.  Denies abdominal pain, emesis, recent fevers, diarrhea.  Reports he has been tolerating p.o. intake and stooling at his baseline.  Denies changes to color, clarity or consistency of urine output from his catheter.  Patient was noted to be hypotensive with EMS, but triaged with normal vital signs here.  Reports feeling better now and has no complaints.   Past Medical History:  Diagnosis Date  . Arthritis   . BPH with obstruction/lower urinary tract symptoms   . GERD (gastroesophageal reflux disease)   . Hyperlipidemia    taken off simvastatin last year  . Hypertension   . Obesity   . Pneumonia    hospitalized   . Sleep apnea    uses cpap machine   . Urinary frequency   . Urine incontinence     Patient Active Problem List   Diagnosis Date Noted  . Elevated blood-pressure reading without diagnosis of hypertension 04/01/2021  . Total  knee replacement status 04/01/2021  . Hearing loss 01/14/2021  . Hand tingling 09/27/2020  . Memory change 05/02/2020  . Right knee pain 02/27/2020  . Health care maintenance 01/10/2020  . Left knee pain 10/06/2019  . Neck pain 07/29/2019  . Swelling of right lower extremity 07/29/2019  . OSA on CPAP 04/07/2019  . URI (upper respiratory infection) 10/07/2016  . Prostate cancer screening 04/06/2016  . Essential hypertension 01/02/2016  . Episodic lightheadedness 01/02/2016  . Muscle cramp 01/02/2016  . Dysphagia 01/02/2016  . Pain in joint, pelvic region and thigh 02/21/2015  . Urinary hesitancy 02/21/2015  . Hyperlipidemia 10/22/2014  . Screening for skin cancer 10/22/2014  . Benign prostatic hyperplasia without urinary obstruction 06/03/2014  . HLD (hyperlipidemia) 06/03/2014    Past Surgical History:  Procedure Laterality Date  . COLONOSCOPY WITH PROPOFOL N/A 02/09/2016   Procedure: COLONOSCOPY WITH PROPOFOL;  Surgeon: Hulen Luster, MD;  Location[facility] ENDOSCOPY;  Service: Gastroenterology;  Laterality: N/A;  . ESOPHAGOGASTRODUODENOSCOPY (EGD) WITH PROPOFOL N/A 02/09/2016   Procedure: ESOPHAGOGASTRODUODENOSCOPY (EGD) WITH PROPOFOL;  Surgeon: Hulen Luster, MD;  Location: [facility] ENDOSCOPY;  Service: Gastroenterology;  Laterality: N/A;  . HERNIA REPAIR     1990's - 3 surgeries  . KNEE ARTHROPLASTY Left 04/01/2021   Procedure: COMPUTER ASSISTED TOTAL KNEE ARTHROPLASTY;  Surgeon: Dereck Leep, MD;  Location: [facility] ORS;  Service: Orthopedics;  Laterality: Left;    Prior to Admission medications   Medication Sig Start Date End Date Taking? Authorizing Provider  amLODipine (NORVASC) 10 MG tablet Take 1 tablet by mouth once daily Patient taking  differently: Take 10 mg by mouth daily. 02/19/21   [name], MD  celecoxib (CELEBREX) 200 MG capsule Take 1 capsule (200 mg total) by mouth 2 (two) times daily. 04/02/21   Fausto Skillern, PA-C  Cholecalciferol (VITAMIN D-3) 1000 units CAPS Take  1,000 Units by mouth daily.    [provider]  Coenzyme Q10 (COQ10 PO) Take 1 capsule by mouth daily.    [provider]  enoxaparin (LOVENOX) 40 MG/0.4ML injection Inject 0.4 mLs (40 mg total) into the skin daily for 14 days. 04/02/21 04/16/21  Fausto Skillern, PA-C  fesoterodine (TOVIAZ) 4 MG TB24 tablet Take 1 tablet (4 mg total) by mouth daily. 05/22/18   [name], PA-C  finasteride (PROSCAR) 5 MG tablet Take 1 tablet (5 mg total) by mouth daily. 04/30/20   [name], PA-C  losartan (COZAAR) 25 MG tablet Take 1 tablet by mouth once daily Patient taking differently: Take 25 mg by mouth daily. 02/24/21   [name], MD  Multiple Vitamin (MULTI-VITAMINS) TABS Take 1 tablet by mouth daily.    [provider]  Omega-3 Fatty Acids (FISH OIL PO) Take 1 capsule by mouth daily.    [provider]  omeprazole (PRILOSEC) 20 MG capsule Take 1 capsule by mouth once daily Patient taking differently: Take 20 mg by mouth daily. 11/06/20   [name], MD  oxyCODONE (OXY IR/ROXICODONE) 5 MG immediate release tablet Take 1 tablet (5 mg total) by mouth every 4 (four) hours as needed for moderate pain (pain score 4-6). 04/02/21   Tamala Julian B, PA-C  simvastatin (ZOCOR) 20 MG tablet TAKE 1 TABLET BY MOUTH AT BEDTIME NEEDS  APPOINTMENT 03/24/21   [name], MD  tamsulosin (FLOMAX) 0.4 MG CAPS capsule Take 1 capsule (0.4 mg total) by mouth daily. 04/30/20   [name], PA-C    Allergies Patient has no known allergies.  Family History  Problem Relation Age of Onset  . Arthritis Mother   . Heart disease Mother        CHF  . Cancer Father        Prostate  . Dementia Father   . Alzheimer's disease Father   . Hypotension Sister   . Kidney cancer Neg Hx   . Bladder Cancer Neg Hx     Social History Social History   Tobacco Use  . Smoking status: Never Smoker  . Smokeless tobacco: Never Used  Vaping Use  . Vaping Use: Never used   Substance Use Topics  . Alcohol use: Yes    Alcohol/week: 0.0 standard drinks    Comment: rarely   . Drug use: No    Review of Systems  Constitutional: No fever/chills.  Positive for presyncopal dizziness in the setting of pain Eyes: No visual changes. ENT: No sore throat. Cardiovascular: Denies chest pain. Respiratory: Denies shortness of breath. Gastrointestinal: No abdominal pain.  No nausea, no vomiting.  No diarrhea.  No constipation. Genitourinary: Negative for dysuria. Musculoskeletal: Negative for back pain. Skin: Negative for rash. Neurological: Negative for headaches, focal weakness or numbness.  ____________________________________________   PHYSICAL EXAM:  VITAL SIGNS: Vitals:   04/13/21 0921 04/13/21 0925  BP: 138/60   Pulse: 62   Resp: 18   Temp: (!) 97.5 F (36.4 C)   SpO2:  98%     Constitutional: Alert and oriented. Well appearing and in no acute distress.  Slightly hard of hearing, but pleasant and conversational in full sentences. Eyes: Conjunctivae are  normal. PERRL. EOMI. Head: Atraumatic. Nose: No congestion/rhinnorhea. Mouth/Throat: Mucous membranes are moist.  Oropharynx non-erythematous. Neck: No stridor. No cervical spine tenderness to palpation. Cardiovascular: Normal rate, regular rhythm. Grossly normal heart sounds.  Good peripheral circulation. Respiratory: Normal respiratory effort.  No retractions. Lungs CTAB. Gastrointestinal: Soft , nondistended, nontender to palpation. No CVA tenderness.  Benign abdomen. Chaperoned GU exam demonstrates indwelling Foley catheter draining clear yellow urine.  StatLock sticker is stuck to itself and not to his leg. No bleeding or discharge from the urethral meatus.  No testicular tenderness or swelling to the scrotum. Musculoskeletal: No lower extremity tenderness nor edema.  No joint effusions. No signs of acute trauma. Neurologic:  Normal speech and language. No gross focal neurologic deficits are  appreciated. No gait instability noted. Skin:  Skin is warm, dry and intact. No rash noted. Psychiatric: Mood and affect are normal. Speech and behavior are normal.  ____________________________________________   LABS (all labs ordered are listed, but only abnormal results are displayed)  Labs Reviewed  CBC WITH DIFFERENTIAL/PLATELET - Abnormal; Notable for the following components:      Result Value   RBC 3.46 (*)    Hemoglobin 10.8 (*)    HCT 33.1 (*)    All other components within normal limits  BASIC METABOLIC PANEL - Abnormal; Notable for the following components:   Glucose, Bld 126 (*)    Calcium 8.3 (*)    All other components within normal limits   ____________________________________________  12 Lead EKG  Sinus rhythm, rate of 65 bpm.  Right bundle branch block and otherwise normal intervals.  No evidence of acute ischemia. Very similar to EKG from 10 days ago ____________________________________________   PROCEDURES and INTERVENTIONS  Procedure(s) performed (including Critical Care):  .1-3 Lead EKG Interpretation Performed by: Vladimir Crofts, MD Authorized by: Vladimir Crofts, MD     Interpretation: normal     ECG rate:  60   ECG rate assessment: normal     Rhythm: sinus rhythm     Ectopy: none     Conduction: normal      Medications - No data to display  ____________________________________________   MDM / ED COURSE   85 year old male presents to the ED with a presyncopal episode in the setting of pain, likely a vasovagal syndrome.  Normal vitals on room air.  His prehospital hypotension further supports this.  He looks clinically well without evidence of acute derangements.  I suspect pain related presyncope.  EKG is nonischemic without evidence of interval changes to suggest cardiac pathology.  We will check basic labs to ensure no anemia, renal dysfunction, hyperkalemia due to the patient on telemetry.  Anticipate outpatient management.  We will replace  a StatLock sticker in appropriate positioning.  Clinical Course as of 04/13/21 1028  Mon Apr 13, 2021  1025 Reassessed.  Patient reports feeling well.  Remains hemodynamically stable.  Wife at the bedside provides her perspective of the history.  No additional significant features.  We discussed slight decrement in his hemoglobin and need for outpatient recheck with his PCP in the next week and he is agreeable.  We discussed following up tomorrow with urology.  We discussed return precautions for the ED. [DS]    Clinical Course User Index [DS] Vladimir Crofts, MD    ____________________________________________   FINAL CLINICAL IMPRESSION(S) / ED DIAGNOSES  Final diagnoses:  Near syncope     ED Discharge Orders    None       Shamiya Demeritt Tamala Julian  Note:  This document was prepared using Dragon voice recognition software and may include unintentional dictation errors.   Vladimir Crofts, MD 04/13/21 1028

## 2021-04-13 NOTE — Progress Notes (Signed)
04/14/2021 1:27 PM   Brian Pearson [birth year] [id number]  Referring provider: [name], [address] [place],  [place] [address]  Chief Complaint  Patient presents with  . Urinary Retention   Urological history: 1. BPH with LU TS -PSA 1.08 in 09/2019 -I PSS 15/2 -PVR 0 mL -managed with tamsulosin 0.4 mg daily and finasteride 5 mg daily  2. Nocturia -improved with use of CPAP   3. ED -contributing factors of age, BPH, HTN and HLD  HPI: Brian Pearson is a 85 y.o. male who presents today for acute of urinary retention with his wife, Hassan Rowan.   He underwent a left TKA on 04/13 and experienced post-op urinary retention.   Seen in the ED yesterday for syncope secondary to vasovagal reaction to catheter pain.  Catheter Removal Patient is present today for a catheter removal.  9 ml of water was drained from the balloon. A 16 FR foley cath was removed from the bladder no complications were noted . Patient tolerated well.  Performed by: Myself  He returns this afternoon stating that he has been pushing fluids and has been urinating well with a nice strong stream.  His PVR was found to be 364 mL.  Patient denies any modifying or aggravating factors.  Patient denies any gross hematuria, dysuria or suprapubic/flank pain.  Patient denies any fevers, chills, nausea or vomiting.   PMH: Past Medical History:  Diagnosis Date  . Arthritis   . BPH with obstruction/lower urinary tract symptoms   . GERD (gastroesophageal reflux disease)   . Hyperlipidemia    taken off simvastatin last year  . Hypertension   . Obesity   . Pneumonia    hospitalized   . Sleep apnea    uses cpap machine   . Urinary frequency   . Urine incontinence     Surgical History: Past Surgical History:  Procedure Laterality Date  . COLONOSCOPY WITH PROPOFOL N/A 02/09/2016   Procedure: COLONOSCOPY WITH PROPOFOL;  Surgeon: Hulen Luster, MD;  Location: [facility] ENDOSCOPY;  Service:  Gastroenterology;  Laterality: N/A;  . ESOPHAGOGASTRODUODENOSCOPY (EGD) WITH PROPOFOL N/A 02/09/2016   Procedure: ESOPHAGOGASTRODUODENOSCOPY (EGD) WITH PROPOFOL;  Surgeon: Hulen Luster, MD;  Location: [facility] ENDOSCOPY;  Service: Gastroenterology;  Laterality: N/A;  . HERNIA REPAIR     1990's - 3 surgeries  . KNEE ARTHROPLASTY Left 04/01/2021   Procedure: COMPUTER ASSISTED TOTAL KNEE ARTHROPLASTY;  Surgeon: Dereck Leep, MD;  Location: [facility] ORS;  Service: Orthopedics;  Laterality: Left;    Home Medications:  Allergies as of 04/14/2021   No Known Allergies     Medication List       Accurate as of April 14, 2021  1:27 PM. If you have any questions, ask your nurse or doctor.        amLODipine 10 MG tablet Commonly known as: NORVASC Take 1 tablet by mouth once daily   celecoxib 200 MG capsule Commonly known as: CELEBREX Take 1 capsule (200 mg total) by mouth 2 (two) times daily.   COQ10 PO Take 1 capsule by mouth daily.   enoxaparin 40 MG/0.4ML injection Commonly known as: LOVENOX Inject 0.4 mLs (40 mg total) into the skin daily for 14 days.   fesoterodine 4 MG Tb24 tablet Commonly known as: Toviaz Take 1 tablet (4 mg total) by mouth daily.   finasteride 5 MG tablet Commonly known as: PROSCAR Take 1 tablet (5 mg total) by mouth daily.   FISH OIL PO  Take 1 capsule by mouth daily.   losartan 25 MG tablet Commonly known as: COZAAR Take 1 tablet by mouth once daily   Multi-Vitamins Tabs Take 1 tablet by mouth daily.   omeprazole 20 MG capsule Commonly known as: PRILOSEC Take 1 capsule by mouth once daily   oxyCODONE 5 MG immediate release tablet Commonly known as: Oxy IR/ROXICODONE Take 1 tablet (5 mg total) by mouth every 4 (four) hours as needed for moderate pain (pain score 4-6).   simvastatin 20 MG tablet Commonly known as: ZOCOR TAKE 1 TABLET BY MOUTH AT BEDTIME NEEDS  APPOINTMENT   tamsulosin 0.4 MG Caps capsule Commonly known as: FLOMAX Take 1 capsule (0.4  mg total) by mouth daily.   Vitamin D-3 25 MCG (1000 UT) Caps Take 1,000 Units by mouth daily.       Allergies: No Known Allergies  Family History: Family History  Problem Relation Age of Onset  . Arthritis Mother   . Heart disease Mother        CHF  . Cancer Father        Prostate  . Dementia Father   . Alzheimer's disease Father   . Hypotension Sister   . Kidney cancer Neg Hx   . Bladder Cancer Neg Hx     Social History:  reports that he has never smoked. He has never used smokeless tobacco. He reports current alcohol use. He reports that he does not use drugs.  ROS: Pertinent ROS in HPI  Physical Exam: Constitutional:  Well nourished. Alert and oriented, No acute distress. HEENT: [place] AT, mask in place.  Trachea midline Cardiovascular: No clubbing, cyanosis, or edema. Respiratory: Normal respiratory effort, no increased work of breathing.  GU: Patient with uncircumcised phallus.  Foreskin easily retracted.   Urethral meatus is patent.  No penile discharge. No penile lesions or rashes. Scrotum without lesions, cysts, rashes and/or edema.   Neurologic: Grossly intact, no focal deficits, moving all 4 extremities. Psychiatric: Normal mood and affect.  Laboratory Data: Lab Results  Component Value Date   WBC 8.9 04/13/2021   HGB 10.8 (L) 04/13/2021   HCT 33.1 (L) 04/13/2021   MCV 95.7 04/13/2021   PLT 338 04/13/2021    Lab Results  Component Value Date   CREATININE 0.91 04/13/2021    Lab Results  Component Value Date   PSA 1.08 10/05/2019   PSA 0.92 07/19/2017   PSA 2.50 02/21/2015     Lab Results  Component Value Date   TSH 3.08 09/17/2020       Component Value Date/Time   CHOL 147 01/27/2021 0852   HDL 50.90 01/27/2021 0852   CHOLHDL 3 01/27/2021 0852   VLDL 27.8 01/27/2021 0852   LDLCALC 68 01/27/2021 0852    Lab Results  Component Value Date   AST 20 03/23/2021   Lab Results  Component Value Date   ALT 18 03/23/2021    Urinalysis     Component Value Date/Time   COLORURINE YELLOW (A) 03/23/2021 0913   APPEARANCEUR CLEAR (A) 03/23/2021 0913   APPEARANCEUR Clear 04/30/2020 0902   LABSPEC 1.013 03/23/2021 0913   PHURINE 8.0 03/23/2021 0913   GLUCOSEU NEGATIVE 03/23/2021 0913   HGBUR NEGATIVE 03/23/2021 0913   BILIRUBINUR NEGATIVE 03/23/2021 0913   BILIRUBINUR Negative 04/30/2020 0902   KETONESUR NEGATIVE 03/23/2021 0913   PROTEINUR NEGATIVE 03/23/2021 0913   UROBILINOGEN 0.2 02/21/2015 0954   NITRITE NEGATIVE 03/23/2021 0913   LEUKOCYTESUR NEGATIVE 03/23/2021 0913  I have reviewed the  labs.  In and Out Catheterization Patient was cleaned and prepped in a sterile fashion with betadine.  I advised them to use soapy water to clean the head of the penis when at home.  A 14 FR cath was inserted no complications were noted , 300 ml of urine return was noted, urine was yellow clear in color.  He and his wife state they feel comfortable with self catheterization at this time  Pertinent Imaging: Results for BRADIN, MCADORY (MRN [id number]) as of 04/14/2021 13:20  Ref. Range 04/14/2021 13:12  Scan Result Unknown 364   Assessment & Plan:    1. Acute urinary retention:    - foley catheter removed -voiding trial today   -Explained that his residual at this time was larger than we would prefer.  I stated that he could either continue to urinate on his own taking the tamsulosin and finasteride and hope that he did not go into retention during the night (which I would not recommend), I could instruct he and his wife on self-catheterization technique or replace the Foley -They have chosen to learn self-catheterization techniques which we have completed and and I have given him 29 [place] straight male catheters from [employer] to use in case he should go into retention during the night or during the interim between now and our next appointment  Return for Keep scheduled follow-up in May or sooner if they should develop issues with  urinary retention.  These notes generated with voice recognition software. I apologize for typographical errors.  Zara Council, PA-C  [facility] [address]  [place], [place] [address] [phone]

## 2021-04-13 NOTE — Discharge Instructions (Addendum)
As we discussed, please follow-up with your primary doctor in the next week or so to have your blood counts rechecked. Your hemoglobin dropped from 14 --> 11 compared to before the surgery.  This is likely just because of the surgery.  If you develop any bleeding, further passing out, please return to the ED.

## 2021-04-14 ENCOUNTER — Ambulatory Visit: Payer: PPO | Admitting: Urology

## 2021-04-14 ENCOUNTER — Encounter: Payer: Self-pay | Admitting: Urology

## 2021-04-14 DIAGNOSIS — R338 Other retention of urine: Secondary | ICD-10-CM

## 2021-04-14 LAB — BLADDER SCAN AMB NON-IMAGING: Scan Result: 364

## 2021-04-16 DIAGNOSIS — M25662 Stiffness of left knee, not elsewhere classified: Secondary | ICD-10-CM | POA: Diagnosis not present

## 2021-04-19 DIAGNOSIS — Z471 Aftercare following joint replacement surgery: Secondary | ICD-10-CM | POA: Diagnosis not present

## 2021-04-21 DIAGNOSIS — M25662 Stiffness of left knee, not elsewhere classified: Secondary | ICD-10-CM | POA: Diagnosis not present

## 2021-04-24 DIAGNOSIS — M25662 Stiffness of left knee, not elsewhere classified: Secondary | ICD-10-CM | POA: Diagnosis not present

## 2021-04-27 DIAGNOSIS — H2513 Age-related nuclear cataract, bilateral: Secondary | ICD-10-CM | POA: Diagnosis not present

## 2021-04-28 DIAGNOSIS — M25662 Stiffness of left knee, not elsewhere classified: Secondary | ICD-10-CM | POA: Diagnosis not present

## 2021-04-29 ENCOUNTER — Other Ambulatory Visit: Payer: Self-pay | Admitting: Internal Medicine

## 2021-04-29 DIAGNOSIS — R1310 Dysphagia, unspecified: Secondary | ICD-10-CM

## 2021-04-29 NOTE — Progress Notes (Signed)
04/30/2021 10:22 AM   Brian Pearson November 21, 1936 [id number]  Referring provider: [name], MD [address] [place],  [place] [address]  Chief Complaint  Patient presents with  . Benign Prostatic Hypertrophy   Urological history: 1. BPH with LU TS -PSA 1.08 in 09/2019 -I PSS 22/3 -PVR 0 mL -managed with tamsulosin 0.4 mg daily and finasteride 5 mg daily  2. Nocturia -improved with use of CPAP   3. ED -contributing factors of age, BPH, HTN and HLD -SHIM 13  4. Urinary retention -post-op 03/2020  HPI: Brian Pearson is a 85 y.o. male who presents today for a yearly follow up.   He is having issues with his CPAP machine and is trying to get it in for repairs.  Otherwise, he is satisfied with his urinary issues at this time.  Patient denies any modifying or aggravating factors.  Patient denies any gross hematuria, dysuria or suprapubic/flank pain.  Patient denies any fevers, chills, nausea or vomiting.    IPSS    Row Name 04/30/21 0900         International Prostate Symptom Score   How often have you had the sensation of not emptying your bladder? About half the time     How often have you had to urinate less than every two hours? Less than half the time     How often have you found you stopped and started again several times when you urinated? More than half the time     How often have you found it difficult to postpone urination? Almost always     How often have you had a weak urinary stream? About half the time     How often have you had to strain to start urination? Less than half the time     How many times did you typically get up at night to urinate? 3 Times     Total IPSS Score 22           Quality of Life due to urinary symptoms   If you were to spend the rest of your life with your urinary condition just the way it is now how would you feel about that? Mixed            Score:  1-7 Mild 8-19 Moderate 20-35  Severe   He is not sexually active at this time.    SHIM    Row Name 04/30/21 0948         SHIM: Over the last 6 months:   How do you rate your confidence that you could get and keep an erection? Very Low     When you had erections with sexual stimulation, how often were your erections hard enough for penetration (entering your partner)? Almost Always or Always     During sexual intercourse, how often were you able to maintain your erection after you had penetrated (entered) your partner? Almost Always or Always     During sexual intercourse, how difficult was it to maintain your erection to completion of intercourse? Extremely Difficult     When you attempted sexual intercourse, how often was it satisfactory for you? Almost Never or Never           SHIM Total Score   SHIM 13            Score: 1-7 Severe ED 8-11 Moderate ED 12-16 Mild-Moderate ED 17-21 Mild ED 22-25 No ED  PMH: Past Medical History:  Diagnosis Date  . Arthritis   . BPH with obstruction/lower urinary tract symptoms   . GERD (gastroesophageal reflux disease)   . Hyperlipidemia    taken off simvastatin last year  . Hypertension   . Obesity   . Pneumonia    hospitalized   . Sleep apnea    uses cpap machine   . Urinary frequency   . Urine incontinence     Surgical History: Past Surgical History:  Procedure Laterality Date  . COLONOSCOPY WITH PROPOFOL N/A 02/09/2016   Procedure: COLONOSCOPY WITH PROPOFOL;  Surgeon: Hulen Luster, MD;  Location: [facility] ENDOSCOPY;  Service: Gastroenterology;  Laterality: N/A;  . ESOPHAGOGASTRODUODENOSCOPY (EGD) WITH PROPOFOL N/A 02/09/2016   Procedure: ESOPHAGOGASTRODUODENOSCOPY (EGD) WITH PROPOFOL;  Surgeon: Hulen Luster, MD;  Location: [facility] ENDOSCOPY;  Service: Gastroenterology;  Laterality: N/A;  . HERNIA REPAIR     1990's - 3 surgeries  . KNEE ARTHROPLASTY Left 04/01/2021   Procedure: COMPUTER ASSISTED TOTAL KNEE ARTHROPLASTY;  Surgeon: Dereck Leep, MD;  Location: [facility]  ORS;  Service: Orthopedics;  Laterality: Left;    Home Medications:  Allergies as of 04/30/2021   No Known Allergies     Medication List       Accurate as of Apr 30, 2021 10:22 AM. If you have any questions, ask your nurse or doctor.        STOP taking these medications   enoxaparin 40 MG/0.4ML injection Commonly known as: LOVENOX Stopped by: Zara Council, PA-C   fesoterodine 4 MG Tb24 tablet Commonly known as: Toviaz Stopped by: Zara Council, PA-C   oxyCODONE 5 MG immediate release tablet Commonly known as: Oxy IR/ROXICODONE Stopped by: Jerryl Holzhauer, PA-C     TAKE these medications   amLODipine 10 MG tablet Commonly known as: NORVASC Take 1 tablet by mouth once daily   celecoxib 200 MG capsule Commonly known as: CELEBREX Take 1 capsule (200 mg total) by mouth 2 (two) times daily.   COQ10 PO Take 1 capsule by mouth daily.   finasteride 5 MG tablet Commonly known as: PROSCAR Take 1 tablet (5 mg total) by mouth daily.   FISH OIL PO Take 1 capsule by mouth daily.   losartan 25 MG tablet Commonly known as: COZAAR Take 1 tablet by mouth once daily   Multi-Vitamins Tabs Take 1 tablet by mouth daily.   omeprazole 20 MG capsule Commonly known as: PRILOSEC Take 1 capsule by mouth once daily   simvastatin 20 MG tablet Commonly known as: ZOCOR TAKE 1 TABLET BY MOUTH AT BEDTIME NEEDS  APPOINTMENT   tamsulosin 0.4 MG Caps capsule Commonly known as: FLOMAX Take 1 capsule (0.4 mg total) by mouth daily.   Vitamin D-3 25 MCG (1000 UT) Caps Take 1,000 Units by mouth daily.       Allergies: No Known Allergies  Family History: Family History  Problem Relation Age of Onset  . Arthritis Mother   . Heart disease Mother        CHF  . Cancer Father        Prostate  . Dementia Father   . Alzheimer's disease Father   . Hypotension Sister   . Kidney cancer Neg Hx   . Bladder Cancer Neg Hx     Social History:  reports that he has never smoked. He  has never used smokeless tobacco. He reports current alcohol use. He reports that he does not use drugs.  ROS: Pertinent ROS in HPI  Physical Exam: Blood pressure 129/81,  pulse 68, height 6' (1.829 m), weight 227 lb (103 kg). Constitutional:  Well nourished. Alert and oriented, No acute distress. HEENT: [place] AT, mask in place.  Trachea midline Cardiovascular: No clubbing, cyanosis, or edema. Respiratory: Normal respiratory effort, no increased work of breathing. GU: No CVA tenderness.  No bladder fullness or masses.  Patient with uncircumcised phallus.  Foreskin easily retracted Urethral meatus is patent.  No penile discharge. No penile lesions or rashes. Scrotum without lesions, cysts, rashes and/or edema.  Testicles are located scrotally bilaterally. No masses are appreciated in the testicles. Left and right epididymis are normal. Rectal: Patient with  normal sphincter tone. Anus and perineum without scarring or rashes. No rectal masses are appreciated. Prostate is approximately 60 + grams, could only palpate the apex, no nodules are appreciated. Seminal vesicles could not be palpated Neurologic: Grossly intact, no focal deficits, moving all 4 extremities. Psychiatric: Normal mood and affect.  Laboratory Data: Lab Results  Component Value Date   WBC 8.9 04/13/2021   HGB 10.8 (L) 04/13/2021   HCT 33.1 (L) 04/13/2021   MCV 95.7 04/13/2021   PLT 338 04/13/2021    Lab Results  Component Value Date   CREATININE 0.91 04/13/2021    Lab Results  Component Value Date   PSA 1.08 10/05/2019   PSA 0.92 07/19/2017   PSA 2.50 02/21/2015     Lab Results  Component Value Date   TSH 3.08 09/17/2020       Component Value Date/Time   CHOL 147 01/27/2021 0852   HDL 50.90 01/27/2021 0852   CHOLHDL 3 01/27/2021 0852   VLDL 27.8 01/27/2021 0852   LDLCALC 68 01/27/2021 0852    Lab Results  Component Value Date   AST 20 03/23/2021   Lab Results  Component Value Date   ALT 18  03/23/2021    Urinalysis    Component Value Date/Time   COLORURINE YELLOW (A) 03/23/2021 0913   APPEARANCEUR CLEAR (A) 03/23/2021 0913   APPEARANCEUR Clear 04/30/2020 0902   LABSPEC 1.013 03/23/2021 0913   PHURINE 8.0 03/23/2021 0913   GLUCOSEU NEGATIVE 03/23/2021 0913   HGBUR NEGATIVE 03/23/2021 0913   BILIRUBINUR NEGATIVE 03/23/2021 0913   BILIRUBINUR Negative 04/30/2020 0902   KETONESUR NEGATIVE 03/23/2021 0913   PROTEINUR NEGATIVE 03/23/2021 0913   UROBILINOGEN 0.2 02/21/2015 0954   NITRITE NEGATIVE 03/23/2021 0913   LEUKOCYTESUR NEGATIVE 03/23/2021 0913  I have reviewed the labs.    Pertinent Imaging: Results for LYAL, HUSTED (MRN [id number]) as of 04/30/2021 10:22  Ref. Range 04/30/2021 10:13  Scan Result Unknown 0    Assessment & Plan:    1. BPH with LU TS -continue finasteride 5 mg daily and tamsulosin 0.4 mg daily   2. ED -not sexually active at this time  -does not desire any further treatment at this time   Return in about 1 year (around 04/30/2022) for ipps and pvr .  These notes generated with voice recognition software. I apologize for typographical errors.  Zara Council, PA-C  [facility] [address]  [place], [place] [address] [phone]

## 2021-04-30 ENCOUNTER — Encounter: Payer: Self-pay | Admitting: Urology

## 2021-04-30 ENCOUNTER — Ambulatory Visit: Payer: PPO | Admitting: Urology

## 2021-04-30 ENCOUNTER — Other Ambulatory Visit: Payer: Self-pay

## 2021-04-30 VITALS — BP 129/81 | HR 68 | Ht 72.0 in | Wt 227.0 lb

## 2021-04-30 DIAGNOSIS — N529 Male erectile dysfunction, unspecified: Secondary | ICD-10-CM

## 2021-04-30 DIAGNOSIS — N401 Enlarged prostate with lower urinary tract symptoms: Secondary | ICD-10-CM | POA: Diagnosis not present

## 2021-04-30 DIAGNOSIS — N138 Other obstructive and reflux uropathy: Secondary | ICD-10-CM | POA: Diagnosis not present

## 2021-04-30 DIAGNOSIS — M25662 Stiffness of left knee, not elsewhere classified: Secondary | ICD-10-CM | POA: Diagnosis not present

## 2021-04-30 LAB — BLADDER SCAN AMB NON-IMAGING: Scan Result: 0

## 2021-05-05 DIAGNOSIS — M25662 Stiffness of left knee, not elsewhere classified: Secondary | ICD-10-CM | POA: Diagnosis not present

## 2021-05-06 DIAGNOSIS — G4733 Obstructive sleep apnea (adult) (pediatric): Secondary | ICD-10-CM | POA: Diagnosis not present

## 2021-05-07 DIAGNOSIS — M25662 Stiffness of left knee, not elsewhere classified: Secondary | ICD-10-CM | POA: Diagnosis not present

## 2021-05-12 DIAGNOSIS — M25662 Stiffness of left knee, not elsewhere classified: Secondary | ICD-10-CM | POA: Diagnosis not present

## 2021-05-14 DIAGNOSIS — Z96652 Presence of left artificial knee joint: Secondary | ICD-10-CM | POA: Diagnosis not present

## 2021-05-14 DIAGNOSIS — M25662 Stiffness of left knee, not elsewhere classified: Secondary | ICD-10-CM | POA: Diagnosis not present

## 2021-05-19 ENCOUNTER — Other Ambulatory Visit: Payer: Self-pay | Admitting: Internal Medicine

## 2021-05-27 ENCOUNTER — Other Ambulatory Visit: Payer: PPO

## 2021-05-29 ENCOUNTER — Ambulatory Visit: Payer: PPO | Admitting: Internal Medicine

## 2021-05-30 ENCOUNTER — Other Ambulatory Visit: Payer: Self-pay | Admitting: Internal Medicine

## 2021-06-26 ENCOUNTER — Other Ambulatory Visit (INDEPENDENT_AMBULATORY_CARE_PROVIDER_SITE_OTHER): Payer: PPO

## 2021-06-26 ENCOUNTER — Other Ambulatory Visit: Payer: Self-pay

## 2021-06-26 DIAGNOSIS — Z125 Encounter for screening for malignant neoplasm of prostate: Secondary | ICD-10-CM

## 2021-06-26 DIAGNOSIS — I10 Essential (primary) hypertension: Secondary | ICD-10-CM

## 2021-06-26 DIAGNOSIS — E785 Hyperlipidemia, unspecified: Secondary | ICD-10-CM

## 2021-06-26 LAB — BASIC METABOLIC PANEL
BUN: 14 mg/dL (ref 6–23)
CO2: 27 mEq/L (ref 19–32)
Calcium: 9.2 mg/dL (ref 8.4–10.5)
Chloride: 106 mEq/L (ref 96–112)
Creatinine, Ser: 0.86 mg/dL (ref 0.40–1.50)
GFR: 80.2 mL/min (ref >60.00)
Glucose, Bld: 93 mg/dL (ref 70–99)
Potassium: 4.2 mEq/L (ref 3.5–5.1)
Sodium: 141 mEq/L (ref 135–145)

## 2021-06-26 LAB — HEPATIC FUNCTION PANEL
ALT: 12 U/L (ref 0–53)
AST: 15 U/L (ref 0–37)
Albumin: 4.3 g/dL (ref 3.5–5.2)
Alkaline Phosphatase: 74 U/L (ref 39–117)
Bilirubin, Direct: 0.1 mg/dL (ref 0.0–0.3)
Total Bilirubin: 0.6 mg/dL (ref 0.2–1.2)
Total Protein: 6.2 g/dL (ref 6.0–8.3)

## 2021-06-26 LAB — LIPID PANEL
Cholesterol: 138 mg/dL (ref 0–200)
HDL: 55 mg/dL (ref >39.00)
LDL Cholesterol: 61 mg/dL (ref 0–99)
NonHDL: 83.06
Total CHOL/HDL Ratio: 3
Triglycerides: 111 mg/dL (ref 0.0–149.0)
VLDL: 22.2 mg/dL (ref 0.0–40.0)

## 2021-06-26 LAB — PSA, MEDICARE: PSA: 1.24 ng/ml (ref 0.10–4.00)

## 2021-06-29 ENCOUNTER — Other Ambulatory Visit: Payer: Self-pay

## 2021-06-29 ENCOUNTER — Ambulatory Visit (INDEPENDENT_AMBULATORY_CARE_PROVIDER_SITE_OTHER): Payer: PPO | Admitting: Internal Medicine

## 2021-06-29 DIAGNOSIS — R5383 Other fatigue: Secondary | ICD-10-CM | POA: Insufficient documentation

## 2021-06-29 DIAGNOSIS — Z96659 Presence of unspecified artificial knee joint: Secondary | ICD-10-CM

## 2021-06-29 DIAGNOSIS — G4733 Obstructive sleep apnea (adult) (pediatric): Secondary | ICD-10-CM | POA: Diagnosis not present

## 2021-06-29 DIAGNOSIS — Z9989 Dependence on other enabling machines and devices: Secondary | ICD-10-CM | POA: Diagnosis not present

## 2021-06-29 DIAGNOSIS — I10 Essential (primary) hypertension: Secondary | ICD-10-CM | POA: Diagnosis not present

## 2021-06-29 DIAGNOSIS — D649 Anemia, unspecified: Secondary | ICD-10-CM | POA: Insufficient documentation

## 2021-06-29 DIAGNOSIS — E785 Hyperlipidemia, unspecified: Secondary | ICD-10-CM

## 2021-06-29 LAB — CBC WITH DIFFERENTIAL/PLATELET
Basophils Absolute: 0 10*3/uL (ref 0.0–0.1)
Basophils Relative: 0.4 % (ref 0.0–3.0)
Eosinophils Absolute: 0.1 10*3/uL (ref 0.0–0.7)
Eosinophils Relative: 2 % (ref 0.0–5.0)
HCT: 38.6 % — ABNORMAL LOW (ref 39.0–52.0)
Hemoglobin: 12.8 g/dL — ABNORMAL LOW (ref 13.0–17.0)
Lymphocytes Relative: 24 % (ref 12.0–46.0)
Lymphs Abs: 1.6 10*3/uL (ref 0.7–4.0)
MCHC: 33.2 g/dL (ref 30.0–36.0)
MCV: 91.7 fl (ref 78.0–100.0)
Monocytes Absolute: 0.6 10*3/uL (ref 0.1–1.0)
Monocytes Relative: 9.3 % (ref 3.0–12.0)
Neutro Abs: 4.2 10*3/uL (ref 1.4–7.7)
Neutrophils Relative %: 64.3 % (ref 43.0–77.0)
Platelets: 215 10*3/uL (ref 150.0–400.0)
RBC: 4.21 Mil/uL — ABNORMAL LOW (ref 4.22–5.81)
RDW: 13.8 % (ref 11.5–15.5)
WBC: 6.5 10*3/uL (ref 4.0–10.5)

## 2021-06-29 LAB — IBC + FERRITIN
Ferritin: 22 ng/mL (ref 22.0–322.0)
Iron: 98 ug/dL (ref 42–165)
Saturation Ratios: 24.1 % (ref 20.0–50.0)
Transferrin: 290 mg/dL (ref 212.0–360.0)

## 2021-06-29 LAB — VITAMIN B12: Vitamin B-12: 634 pg/mL (ref 211–911)

## 2021-06-29 LAB — TSH: TSH: 2.96 u[IU]/mL (ref 0.35–5.50)

## 2021-06-29 NOTE — Progress Notes (Signed)
Patient ID: Brian Pearson, male   DOB: 23-Feb-1936, 85 y.o.   MRN: [id number]   Subjective:    Patient ID: Brian Pearson, male    DOB: Jul 07, 1936, 85 y.o.   MRN: [id number]  HPI This visit occurred during the SARS-CoV-2 public health emergency.  Safety protocols were in place, including screening questions prior to the visit, additional usage of staff PPE, and extensive cleaning of exam room while observing appropriate contact time as indicated for disinfecting solutions.   Patient here for a scheduled follow up.  Here to follow up regarding his cholesterol and blood pressure.  Is s/p recent knee surgery.  Has been going to therapy.  Had post op urinary retention.  Is doing well now.  No urinary problems currently.  Is doing more exercise now.  Is riding a stationary bike.  Has been doing squats.  Energy improving.  Previous decreased appetite.  Appetite is improving.  Eating.  Weight has decreased.  No chest pain or sob reported.  No abdominal pain.  No nausea or vomiting.    Past Medical History:  Diagnosis Date   Arthritis    BPH with obstruction/lower urinary tract symptoms    GERD (gastroesophageal reflux disease)    Hyperlipidemia    taken off simvastatin last year   Hypertension    Obesity    Pneumonia    hospitalized    Sleep apnea    uses cpap machine    Urinary frequency    Urine incontinence    Past Surgical History:  Procedure Laterality Date   COLONOSCOPY WITH PROPOFOL N/A 02/09/2016   Procedure: COLONOSCOPY WITH PROPOFOL;  Surgeon: Hulen Luster, MD;  Location: [facility] ENDOSCOPY;  Service: Gastroenterology;  Laterality: N/A;   ESOPHAGOGASTRODUODENOSCOPY (EGD) WITH PROPOFOL N/A 02/09/2016   Procedure: ESOPHAGOGASTRODUODENOSCOPY (EGD) WITH PROPOFOL;  Surgeon: Hulen Luster, MD;  [facility] ENDOSCOPY;  Service: Gastroenterology;  Laterality: N/A;   HERNIA REPAIR     1990's - 3 surgeries   KNEE ARTHROPLASTY Left 04/01/2021   Procedure: COMPUTER ASSISTED TOTAL KNEE ARTHROPLASTY;   Surgeon: Dereck Leep, MD;  Location: [facility] ORS;  Service: Orthopedics;  Laterality: Left;   Family History  Problem Relation Age of Onset   Arthritis Mother    Heart disease Mother        CHF   Cancer Father        Prostate   Dementia Father    Alzheimer's disease Father    Hypotension Sister    Kidney cancer Neg Hx    Bladder Cancer Neg Hx    Social History   Socioeconomic History   Marital status: Married    Spouse name: Not on file   Number of children: Not on file   Years of education: Not on file   Highest education level: Not on file  Occupational History   Not on file  Tobacco Use   Smoking status: Never   Smokeless tobacco: Never  Vaping Use   Vaping Use: Never used  Substance and Sexual Activity   Alcohol use: Yes    Alcohol/week: 0.0 standard drinks    Comment: rarely    Drug use: No   Sexual activity: Yes  Other Topics Concern   Not on file  Social History Narrative   Lives in [place].      Work - retired, works part time driving for [employer] - golf in past      Exercise - stationary  bike      Diet - regular      Served in [employer]   Social Determinants of Health   Financial Resource Strain: Not on file  Food Insecurity: Not on file  Transportation Needs: Not on file  Physical Activity: Not on file  Stress: Not on file  Social Connections: Not on file    Review of Systems  Constitutional:  Negative for appetite change and unexpected weight change.  HENT:  Negative for congestion and sinus pressure.   Respiratory:  Negative for cough, chest tightness and shortness of breath.   Cardiovascular:  Negative for chest pain and palpitations.  Gastrointestinal:  Negative for abdominal pain, diarrhea, nausea and vomiting.  Genitourinary:  Negative for difficulty urinating and dysuria.  Musculoskeletal:  Negative for joint swelling and myalgias.  Skin:  Negative for color change and rash.  Neurological:  Negative  for dizziness, light-headedness and headaches.  Psychiatric/Behavioral:  Negative for agitation and dysphoric mood.       Objective:    Physical Exam Constitutional:      General: He is not in acute distress.    Appearance: Normal appearance. He is well-developed.  HENT:     Head: Normocephalic and atraumatic.     Right Ear: External ear normal.     Left Ear: External ear normal.  Eyes:     General: No scleral icterus.       Right eye: No discharge.        Left eye: No discharge.  Cardiovascular:     Rate and Rhythm: Normal rate and regular rhythm.  Pulmonary:     Effort: Pulmonary effort is normal. No respiratory distress.     Breath sounds: Normal breath sounds.  Abdominal:     General: Bowel sounds are normal.     Palpations: Abdomen is soft.     Tenderness: There is no abdominal tenderness.  Musculoskeletal:        General: No swelling or tenderness.     Cervical back: Neck supple. No tenderness.  Lymphadenopathy:     Cervical: No cervical adenopathy.  Skin:    Findings: No erythema or rash.  Neurological:     Mental Status: He is alert.  Psychiatric:        Mood and Affect: Mood normal.        Behavior: Behavior normal.    BP (!) 144/68 (BP Location: Left Arm, Patient Position: Sitting)   Pulse 69   Temp (!) 96.8 F (36 C)   Ht 6' 0.01" (1.829 m)   Wt 218 lb 9.6 oz (99.2 kg)   SpO2 97%   BMI 29.64 kg/m  Wt Readings from Last 3 Encounters:  06/29/21 218 lb 9.6 oz (99.2 kg)  04/30/21 227 lb (103 kg)  04/13/21 227 lb (103 kg)    Outpatient Encounter Medications as of 06/29/2021  Medication Sig   amLODipine (NORVASC) 10 MG tablet Take 1 tablet by mouth once daily   celecoxib (CELEBREX) 200 MG capsule Take 1 capsule (200 mg total) by mouth 2 (two) times daily.   Cholecalciferol (VITAMIN D-3) 1000 units CAPS Take 1,000 Units by mouth daily.   Coenzyme Q10 (COQ10 PO) Take 1 capsule by mouth daily.   finasteride (PROSCAR) 5 MG tablet Take 1 tablet (5 mg  total) by mouth daily.   losartan (COZAAR) 25 MG tablet Take 1 tablet by mouth once daily   Multiple Vitamin (MULTI-VITAMINS) TABS Take 1 tablet by mouth daily.   Omega-3  Fatty Acids (FISH OIL PO) Take 1 capsule by mouth daily.   omeprazole (PRILOSEC) 20 MG capsule Take 1 capsule by mouth once daily   simvastatin (ZOCOR) 20 MG tablet TAKE 1 TABLET BY MOUTH AT BEDTIME NEEDS  APPOINTMENT   tamsulosin (FLOMAX) 0.4 MG CAPS capsule Take 1 capsule (0.4 mg total) by mouth daily.   No facility-administered encounter medications on file as of 06/29/2021.     Lab Results  Component Value Date   WBC 6.5 06/29/2021   HGB 12.8 (L) 06/29/2021   HCT 38.6 (L) 06/29/2021   PLT 215.0 06/29/2021   GLUCOSE 93 06/26/2021   CHOL 138 06/26/2021   TRIG 111.0 06/26/2021   HDL 55.00 06/26/2021   LDLCALC 61 06/26/2021   ALT 12 06/26/2021   AST 15 06/26/2021   NA 141 06/26/2021   K 4.2 06/26/2021   CL 106 06/26/2021   CREATININE 0.86 06/26/2021   BUN 14 06/26/2021   CO2 27 06/26/2021   TSH 2.96 06/29/2021   PSA 1.24 06/26/2021   INR 1.0 03/23/2021   MICROALBUR 0.7 10/22/2014       Assessment & Plan:   Problem List Items Addressed This Visit     Anemia    Recent labs revealed decreased hgb.  Noticed after surgery.  Recheck cbc with iron studies and B12.  Follow.        Relevant Orders   CBC with Differential/Platelet (Completed)   Vitamin B12 (Completed)   IBC + Ferritin (Completed)   Essential hypertension    Continue losartan and amlodipine.  Pressure slightly increased on initial check.  Recheck improved.  No changes in medication.  Follow pressures.  Follow metabolic panel.        Fatigue   Relevant Orders   TSH (Completed)   Hyperlipidemia    On simvastatin.  Low cholesterol diet and exercise.  Follow lipid panel and liver function tests.         OSA on CPAP    Continue cpap.  Follow.        Total knee replacement status    S/p knee surgery.  Has been going to therapy.   Doing well.  Continue to follow up with ortho.          [name], MD

## 2021-07-06 ENCOUNTER — Encounter: Payer: Self-pay | Admitting: Internal Medicine

## 2021-07-06 NOTE — Assessment & Plan Note (Signed)
Continue cpap.  Follow.  

## 2021-07-06 NOTE — Assessment & Plan Note (Signed)
S/p knee surgery.  Has been going to therapy.  Doing well.  Continue to follow up with ortho.

## 2021-07-06 NOTE — Assessment & Plan Note (Addendum)
Continue losartan and amlodipine.  Pressure slightly increased on initial check.  Recheck improved.  No changes in medication.  Follow pressures.  Follow metabolic panel.

## 2021-07-06 NOTE — Assessment & Plan Note (Signed)
Recent labs revealed decreased hgb.  Noticed after surgery.  Recheck cbc with iron studies and B12.  Follow.

## 2021-07-06 NOTE — Assessment & Plan Note (Signed)
On simvastatin.  Low cholesterol diet and exercise.  Follow lipid panel and liver function tests.   

## 2021-07-07 ENCOUNTER — Other Ambulatory Visit: Payer: Self-pay | Admitting: Internal Medicine

## 2021-07-07 DIAGNOSIS — E785 Hyperlipidemia, unspecified: Secondary | ICD-10-CM

## 2021-07-08 DIAGNOSIS — G4733 Obstructive sleep apnea (adult) (pediatric): Secondary | ICD-10-CM | POA: Diagnosis not present

## 2021-07-09 ENCOUNTER — Other Ambulatory Visit: Payer: Self-pay | Admitting: Urology

## 2021-07-09 DIAGNOSIS — N401 Enlarged prostate with lower urinary tract symptoms: Secondary | ICD-10-CM

## 2021-08-05 ENCOUNTER — Other Ambulatory Visit: Payer: Self-pay | Admitting: Urology

## 2021-08-05 DIAGNOSIS — N401 Enlarged prostate with lower urinary tract symptoms: Secondary | ICD-10-CM

## 2021-08-28 ENCOUNTER — Other Ambulatory Visit: Payer: Self-pay | Admitting: Internal Medicine

## 2021-09-23 ENCOUNTER — Encounter: Payer: Self-pay | Admitting: Internal Medicine

## 2021-09-23 ENCOUNTER — Other Ambulatory Visit: Payer: Self-pay

## 2021-09-23 ENCOUNTER — Ambulatory Visit: Payer: PPO | Admitting: Internal Medicine

## 2021-09-23 VITALS — BP 148/66 | HR 86 | Temp 97.3°F | Ht 72.0 in | Wt 220.0 lb

## 2021-09-23 DIAGNOSIS — G4733 Obstructive sleep apnea (adult) (pediatric): Secondary | ICD-10-CM

## 2021-09-23 NOTE — Patient Instructions (Signed)
Recommend SPLIT NIGHT SLEEP NIGHT

## 2021-09-23 NOTE — Progress Notes (Signed)
Name: Brian Pearson MRN: [id number] DOB: [birth year]     CONSULTATION DATE: 09/23/2021  REFERRING MD :[place] FOR SEVERE SLEEP APNEA Patient diagnosed with severe sleep apnea AHI of 40 178 desaturation episodes 300 total episodes of apnea and hypopnea Excellent compliance report previous office visit 100%   CHIEF COMPLAINT:  Follow-up OSA    HISTORY OF PRESENT ILLNESS: Patient with excellent compliance report However AHI still remains 39 pPatient wants sleep study split-night for definitive therapeutic assessment   No exacerbation at this time No evidence of heart failure at this time No evidence or signs of infection at this time No respiratory distress No fevers, chills, nausea, vomiting, diarrhea No evidence of lower extremity edema No evidence hemoptysis\    PAST MEDICAL HISTORY :   has a past medical history of Arthritis, BPH with obstruction/lower urinary tract symptoms, GERD (gastroesophageal reflux disease), Hyperlipidemia, Hypertension, Obesity, Pneumonia, Sleep apnea, Urinary frequency, and Urine incontinence.  has a past surgical history that includes Hernia repair; Colonoscopy with propofol (N/A, 02/09/2016); Esophagogastroduodenoscopy (egd) with propofol (N/A, 02/09/2016); and Knee Arthroplasty (Left, 04/01/2021). Prior to Admission medications   Medication Sig Start Date End Date Taking? Authorizing Provider  amLODipine (NORVASC) 2.5 MG tablet TAKE 1 TABLET BY MOUTH ONCE DAILY 12/18/18   [name], MD  aspirin EC 81 MG tablet Take by mouth.    [provider]  Cholecalciferol (VITAMIN D-3) 1000 units CAPS Take 1 capsule by mouth.    [provider]  Coenzyme Q10 (COQ10 PO) Take 1 capsule by mouth daily.    [provider]  fesoterodine (TOVIAZ) 4 MG TB24 tablet Take 1 tablet (4 mg total) by mouth daily. 05/22/18   [name], PA-C  finasteride (PROSCAR) 5 MG tablet Take 1 tablet (5  mg total) by mouth daily. 05/01/19   [name], PA-C  Multiple Vitamin (MULTI-VITAMINS) TABS Take by mouth.    [provider]  Omega-3 Fatty Acids (FISH OIL PO) Take by mouth.    [provider]  omeprazole (PRILOSEC) 20 MG capsule TAKE 1 CAPSULE BY MOUTH ONCE DAILY 11/14/18   [name], MD  simvastatin (ZOCOR) 20 MG tablet TAKE 1 TABLET BY MOUTH AT BEDTIME NEEDS  APPOINTMENT  FOR  ADDITIONAL  OR  90  DAY  REFILLS 04/04/19   [name], MD  tamsulosin (FLOMAX) 0.4 MG CAPS capsule Take 1 capsule (0.4 mg total) by mouth daily. 05/01/19   [name], PA-C   No Known Allergies SOCIAL HISTORY:  reports that he has never smoked. He has never used smokeless tobacco. He reports current alcohol use. He reports that he does not use drugs.   Review of Systems:  Gen:  Denies  fever, sweats, chills weight loss  HEENT: Denies blurred vision, double vision, ear pain, eye pain, hearing loss, nose bleeds, sore throat Cardiac:  No dizziness, chest pain or heaviness, chest tightness,edema, No JVD Resp:   No cough, -sputum production, -shortness of breath,-wheezing, -hemoptysis,  Other:  All other systems negative   BP (!) 148/66 (BP Location: Left Arm, Patient Position: Sitting, Cuff Size: Large)   Pulse 86   Temp (!) 97.3 F (36.3 C)   Ht 6' (1.829 m)   Wt 220 lb (99.8 kg)   SpO2 98%   BMI 29.84 kg/m   Physical Examination:   General Appearance: No distress  Neuro:without focal findings,  speech normal,  HEENT: PERRLA, EOM intact.  Pulmonary: normal breath sounds, No wheezing.   ALL OTHER ROS ARE NEGATIVE      ASSESSMENT AND PLAN SYNOPSIS Patient with underlying severe sleep apnea Continue auto CPAP as prescribed however he will need a split-night sleep study for definitive therapeutic analysis Split-night sleep study ordered   Hypertension Sleep apnea can contribute to hypertension therefore treatment of sleep apnea is important part of  hypertension management    MEDICATION ADJUSTMENTS/LABS AND TESTS ORDERED:  Split-night sleep study ordered  CURRENT MEDICATIONS REVIEWED AT LENGTH WITH PATIENT TODAY   Patient satisfied with Plan of action and management. All questions answered  Follow up 1 year  Total time spent 20 minutes    Maretta Bees Patricia Pesa, M.D.  [name]  Medical Director [place] Director [facility] Cardio-Pulmonary Department

## 2021-10-01 ENCOUNTER — Other Ambulatory Visit: Payer: Self-pay | Admitting: Internal Medicine

## 2021-10-06 DIAGNOSIS — G4733 Obstructive sleep apnea (adult) (pediatric): Secondary | ICD-10-CM | POA: Diagnosis not present

## 2021-10-08 ENCOUNTER — Ambulatory Visit (INDEPENDENT_AMBULATORY_CARE_PROVIDER_SITE_OTHER): Payer: PPO

## 2021-10-08 VITALS — Ht 72.0 in | Wt 220.0 lb

## 2021-10-08 DIAGNOSIS — Z0000 Encounter for general adult medical examination without abnormal findings: Secondary | ICD-10-CM

## 2021-10-08 NOTE — Patient Instructions (Addendum)
Brian Pearson , Thank you for taking time to come for your Medicare Wellness Visit. I appreciate your ongoing commitment to your health goals. Please review the following plan we discussed and let me know if I can assist you in the future.   These are the goals we discussed:  Goals      Low Carb Diet     Maintain Healthy Lifestyle     Patient states he want to continue exercise and keep a Healthy diet.        This is a list of the screening recommended for you and due dates:  Health Maintenance  Topic Date Due   Pneumonia Vaccine (2 - PPSV23 if available, else PCV20) 10/23/2015   Flu Shot  07/20/2021   COVID-19 Vaccine (3 - Booster for Pfizer series) 10/24/2021*   Zoster (Shingles) Vaccine (1 of 2) 01/08/2022*   Tetanus Vaccine  08/27/2025   HPV Vaccine  Aged Out  *Topic was postponed. The date shown is not the original due date.    Advanced directives: Completed.per patient. Bring a copy to place on chart.  Conditions/risks identified: None new.  Next appointment: Follow up in one year for your annual wellness visit.   Preventive Care 84 Years and Older, Male Preventive care refers to lifestyle choices and visits with your health care provider that can promote health and wellness. What does preventive care include? A yearly physical exam. This is also called an annual well check. Dental exams once or twice a year. Routine eye exams. Ask your health care provider how often you should have your eyes checked. Personal lifestyle choices, including: Daily care of your teeth and gums. Regular physical activity. Eating a healthy diet. Avoiding tobacco and drug use. Limiting alcohol use. Practicing safe sex. Taking low doses of aspirin every day. Taking vitamin and mineral supplements as recommended by your health care provider. What happens during an annual well check? The services and screenings done by your health care provider during your annual well check will depend on  your age, overall health, lifestyle risk factors, and family history of disease. Counseling  Your health care provider may ask you questions about your: Alcohol use. Tobacco use. Drug use. Emotional well-being. Home and relationship well-being. Sexual activity. Eating habits. History of falls. Memory and ability to understand (cognition). Work and work Statistician. Screening  You may have the following tests or measurements: Height, weight, and BMI. Blood pressure. Lipid and cholesterol levels. These may be checked every 5 years, or more frequently if you are over 84 years old. Skin check. Lung cancer screening. You may have this screening every year starting at age 70 if you have a 30-pack-year history of smoking and currently smoke or have quit within the past 15 years. Fecal occult blood test (FOBT) of the stool. You may have this test every year starting at age 45. Flexible sigmoidoscopy or colonoscopy. You may have a sigmoidoscopy every 5 years or a colonoscopy every 10 years starting at age 33. Prostate cancer screening. Recommendations will vary depending on your family history and other risks. Hepatitis C blood test. Hepatitis B blood test. Sexually transmitted disease (STD) testing. Diabetes screening. This is done by checking your blood sugar (glucose) after you have not eaten for a while (fasting). You may have this done every 1-3 years. Abdominal aortic aneurysm (AAA) screening. You may need this if you are a current or former smoker. Osteoporosis. You may be screened starting at age 70 if you are at  high risk. Talk with your health care provider about your test results, treatment options, and if necessary, the need for more tests. Vaccines  Your health care provider may recommend certain vaccines, such as: Influenza vaccine. This is recommended every year. Tetanus, diphtheria, and acellular pertussis (Tdap, Td) vaccine. You may need a Td booster every 10 years. Zoster  vaccine. You may need this after age 90. Pneumococcal 13-valent conjugate (PCV13) vaccine. One dose is recommended after age 15. Pneumococcal polysaccharide (PPSV23) vaccine. One dose is recommended after age 3. Talk to your health care provider about which screenings and vaccines you need and how often you need them. This information is not intended to replace advice given to you by your health care provider. Make sure you discuss any questions you have with your health care provider. Document Released: 01/02/2016 Document Revised: 08/25/2016 Document Reviewed: 10/07/2015 Elsevier Interactive Patient Education  2017 [employer] Prevention in the Home Falls can cause injuries. They can happen to people of all ages. There are many things you can do to make your home safe and to help prevent falls. What can I do on the outside of my home? Regularly fix the edges of walkways and driveways and fix any cracks. Remove anything that might make you trip as you walk through a door, such as a raised step or threshold. Trim any bushes or trees on the path to your home. Use bright outdoor lighting. Clear any walking paths of anything that might make someone trip, such as rocks or tools. Regularly check to see if handrails are loose or broken. Make sure that both sides of any steps have handrails. Any raised decks and porches should have guardrails on the edges. Have any leaves, snow, or ice cleared regularly. Use sand or salt on walking paths during winter. Clean up any spills in your garage right away. This includes oil or grease spills. What can I do in the bathroom? Use night lights. Install grab bars by the toilet and in the tub and shower. Do not use towel bars as grab bars. Use non-skid mats or decals in the tub or shower. If you need to sit down in the shower, use a plastic, non-slip stool. Keep the floor dry. Clean up any water that spills on the floor as soon as it happens. Remove  soap buildup in the tub or shower regularly. Attach bath mats securely with double-sided non-slip rug tape. Do not have throw rugs and other things on the floor that can make you trip. What can I do in the bedroom? Use night lights. Make sure that you have a light by your bed that is easy to reach. Do not use any sheets or blankets that are too big for your bed. They should not hang down onto the floor. Have a firm chair that has side arms. You can use this for support while you get dressed. Do not have throw rugs and other things on the floor that can make you trip. What can I do in the kitchen? Clean up any spills right away. Avoid walking on wet floors. Keep items that you use a lot in easy-to-reach places. If you need to reach something above you, use a strong step stool that has a grab bar. Keep electrical cords out of the way. Do not use floor polish or wax that makes floors slippery. If you must use wax, use non-skid floor wax. Do not have throw rugs and other things on the floor that  can make you trip. What can I do with my stairs? Do not leave any items on the stairs. Make sure that there are handrails on both sides of the stairs and use them. Fix handrails that are broken or loose. Make sure that handrails are as long as the stairways. Check any carpeting to make sure that it is firmly attached to the stairs. Fix any carpet that is loose or worn. Avoid having throw rugs at the top or bottom of the stairs. If you do have throw rugs, attach them to the floor with carpet tape. Make sure that you have a light switch at the top of the stairs and the bottom of the stairs. If you do not have them, ask someone to add them for you. What else can I do to help prevent falls? Wear shoes that: Do not have high heels. Have rubber bottoms. Are comfortable and fit you well. Are closed at the toe. Do not wear sandals. If you use a stepladder: Make sure that it is fully opened. Do not climb a  closed stepladder. Make sure that both sides of the stepladder are locked into place. Ask someone to hold it for you, if possible. Clearly mark and make sure that you can see: Any grab bars or handrails. First and last steps. Where the edge of each step is. Use tools that help you move around (mobility aids) if they are needed. These include: Canes. Walkers. Scooters. Crutches. Turn on the lights when you go into a dark area. Replace any light bulbs as soon as they burn out. Set up your furniture so you have a clear path. Avoid moving your furniture around. If any of your floors are uneven, fix them. If there are any pets around you, be aware of where they are. Review your medicines with your doctor. Some medicines can make you feel dizzy. This can increase your chance of falling. Ask your doctor what other things that you can do to help prevent falls. This information is not intended to replace advice given to you by your health care provider. Make sure you discuss any questions you have with your health care provider. Document Released: 10/02/2009 Document Revised: 05/13/2016 Document Reviewed: 01/10/2015 Elsevier Interactive Patient Education  2017 [employer].

## 2021-10-08 NOTE — Progress Notes (Signed)
Subjective:   Brian Pearson is a 85 y.o. male who presents for Medicare Annual/Subsequent preventive examination.  Review Systems. No ROS Cardiac Risk Factors include: advanced age (>7men, >55 women);male gender   Objective:    Today's Vitals   10/08/21 0848  Weight: 220 lb (99.8 kg)  Height: 6' (1.829 m)   Body mass index is 29.84 kg/m.  Advanced Directives 10/08/2021 04/13/2021 04/01/2021 03/23/2021 10/05/2019 08/31/2018 08/30/2017  Does Patient Have a Medical Advance Directive? Yes - Yes Yes Yes Yes Yes  Type of Paramedic of [place];Living will Living will;Healthcare Power of Attorney Living will Living will [place];Living will Living will;Healthcare Power of [place];Living will  Does patient want to make changes to medical advance directive? No - Patient declined - No - Patient declined - No - Patient declined No - Patient declined No - Patient declined  Copy of [place] in Chart? No - copy requested - - - Yes - validated most recent copy scanned in chart (See row information) Yes Yes    Current Medications (verified) Outpatient Encounter Medications as of 10/08/2021  Medication Sig   amLODipine (NORVASC) 10 MG tablet Take 1 tablet by mouth once daily   celecoxib (CELEBREX) 200 MG capsule Take 1 capsule (200 mg total) by mouth 2 (two) times daily. (Patient not taking: Reported on 09/23/2021)   Cholecalciferol (VITAMIN D-3) 1000 units CAPS Take 1,000 Units by mouth daily.   Coenzyme Q10 (COQ10 PO) Take 1 capsule by mouth daily.   finasteride (PROSCAR) 5 MG tablet Take 1 tablet by mouth once daily   losartan (COZAAR) 25 MG tablet Take 1 tablet by mouth once daily   Multiple Vitamin (MULTI-VITAMINS) TABS Take 1 tablet by mouth daily.   Omega-3 Fatty Acids (FISH OIL PO) Take 1 capsule by mouth daily.   omeprazole (PRILOSEC) 20 MG capsule Take 1 capsule by mouth once daily   simvastatin  (ZOCOR) 20 MG tablet TAKE 1 TABLET BY MOUTH AT BEDTIME (NEEDS  APPOINTMENT)   tamsulosin (FLOMAX) 0.4 MG CAPS capsule Take 1 capsule by mouth once daily   No facility-administered encounter medications on file as of 10/08/2021.    Allergies (verified) Patient has no known allergies.   History: Past Medical History:  Diagnosis Date   Arthritis    BPH with obstruction/lower urinary tract symptoms    GERD (gastroesophageal reflux disease)    Hyperlipidemia    taken off simvastatin last year   Hypertension    Obesity    Pneumonia    hospitalized    Sleep apnea    uses cpap machine    Urinary frequency    Urine incontinence    Past Surgical History:  Procedure Laterality Date   COLONOSCOPY WITH PROPOFOL N/A 02/09/2016   Procedure: COLONOSCOPY WITH PROPOFOL;  Surgeon: Hulen Luster, MD;  [facility] ENDOSCOPY;  Service: Gastroenterology;  Laterality: N/A;   ESOPHAGOGASTRODUODENOSCOPY (EGD) WITH PROPOFOL N/A 02/09/2016   Procedure: ESOPHAGOGASTRODUODENOSCOPY (EGD) WITH PROPOFOL;  Surgeon: Hulen Luster, MD;  Location: [facility] ENDOSCOPY;  Service: Gastroenterology;  Laterality: N/A;   HERNIA REPAIR     1990's - 3 surgeries   KNEE ARTHROPLASTY Left 04/01/2021   Procedure: COMPUTER ASSISTED TOTAL KNEE ARTHROPLASTY;  Surgeon: Dereck Leep, MD;  Location: [facility] ORS;  Service: Orthopedics;  Laterality: Left;   Family History  Problem Relation Age of Onset   Arthritis Mother    Heart disease Mother  CHF   Cancer Father        Prostate   Dementia Father    Alzheimer's disease Father    Hypotension Sister    Kidney cancer Neg Hx    Bladder Cancer Neg Hx    Social History   Socioeconomic History   Marital status: Married    Spouse name: Not on file   Number of children: Not on file   Years of education: Not on file   Highest education level: Not on file  Occupational History   Not on file  Tobacco Use   Smoking status: Never   Smokeless tobacco: Never  Vaping Use   Vaping  Use: Never used  Substance and Sexual Activity   Alcohol use: Yes    Alcohol/week: 0.0 standard drinks    Comment: rarely    Drug use: No   Sexual activity: Yes  Other Topics Concern   Not on file  Social History Narrative   Lives in [place].      Work - retired, works part time driving for [employer] - golf in past      [place] - regular      Served in [employer], [place] Strain: Low Risk    Difficulty of Paying Living Expenses: Not hard at [employer] Insecurity: No [place] in [place]: Never true   Arboriculturist in the Last Year: Never true  Transportation Needs: No Transportation Needs   Lack of Transportation (Medical): No   Lack of Transportation (Non-Medical): No  Physical Activity: Sufficiently Active   Days of Exercise per Week: 5 days   Minutes of Exercise per Session: 30 min  Stress: No Stress Concern Present   Feeling of Stress : Not at all  Social Connections: Moderately Integrated   Frequency of Communication with Friends and Family: Three times a week   Frequency of Social Gatherings with Friends and Family: More than three times a week   Attends Religious Services: 1 to 4 times per year   Active Member of [employer] or Organizations: No   Attends Music therapist: Never   Marital Status: Married   Clinical Intake:  Pre-visit preparation completed: Yes   Diabetes: No  How often do you need to have someone help you when you read instructions, pamphlets, or other written materials from your doctor or pharmacy?: 1 - Never  Diabetic? No  Interpreter Needed?: No  Activities of Daily Living In your present state of health, do you have any difficulty performing the following activities: 10/08/2021 04/01/2021  Hearing? N Y  Comment Wears hearing aids -  Vision? N N  Comment Wears glasses -   Difficulty concentrating or making decisions? N Y  Comment Age appropriate delay with name recall -  Walking or climbing stairs? N N  Dressing or bathing? N N  Doing errands, shopping? N N  Preparing Food and eating ? N -  Using the Toilet? N -  In the past six months, have you accidently leaked urine? N -  Do you have problems with loss of bowel control? N -  Managing your Medications? N -  Managing your Finances? N -  Housekeeping or managing your Housekeeping? N -  Some recent data might be hidden    Patient  Care Team: [name], MD as PCP - General (Internal Medicine)  Indicate any recent Medical Services you may have received from other than [facility] providers in the past year (date may be approximate).     Assessment:   This is a routine wellness examination for Brian Pearson. I connected with  [name] on 10/08/21 by a Telephone enabled telemedicine application and verified that I am speaking with the correct person using two identifiers.   I discussed the limitations of evaluation and management by telemedicine. The patient expressed understanding and agreed to proceed.   Hearing/Vision screen Hearing Screening - Comments:: Wears hearing aids Vision Screening - Comments:: Wears glasses Followed by [facility].  Dietary issues and exercise activities discussed: Current Exercise Habits: Home exercise routine, Time (Minutes): 30, Frequency (Times/Week): 5, Weekly Exercise (Minutes/Week): 150, Intensity: Moderate, Exercise limited by: None identified Healthy Diet.   Goals Addressed             This Visit's Progress    Maintain Healthy Lifestyle       Patient states he want to continue exercise and keep a Healthy diet.       Depression Screen PHQ 2/9 Scores 10/08/2021 10/08/2021 06/29/2021 10/07/2020 10/05/2019 08/31/2018 08/30/2017  PHQ - 2 Score 0 0 0 0 0 0 0  PHQ- 9 Score - - - - - - 0    Fall Risk Fall Risk  10/08/2021 06/29/2021 10/07/2020  07/15/2020 10/05/2019  Falls in the past year? 0 0 0 0 0  Comment - - - Emmi Telephone Survey: data to providers prior to load -  Number falls in past yr: 0 0 0 - -  Injury with Fall? 0 0 - - -  Follow up - Falls evaluation completed Falls evaluation completed - -    FALL RISK PREVENTION PERTAINING TO THE HOME:  Any stairs in or around the home? No  If so, are there any without handrails? No  Home free of loose throw rugs in walkways, pet beds, electrical cords, etc? Yes  Adequate lighting in your home to reduce risk of falls? Yes   ASSISTIVE DEVICES UTILIZED TO PREVENT FALLS:  Life alert? No  Use of a cane, walker or w/c? No  Grab bars in the bathroom? No  Shower chair or bench in shower? No  Elevated toilet seat or a handicapped toilet? No   TIMED UP AND GO:  No.  Cognitive Function: MMSE - Mini Mental State Exam 08/30/2016 08/28/2015  Orientation to time 5 5  Orientation to Place 5 5  Registration 3 3  Attention/ Calculation 5 5  Recall 3 3  Language- name 2 objects 2 2  Language- repeat 1 1  Language- follow 3 step command 3 3  Language- read & follow direction 1 1  Write a sentence 1 1  Copy design 1 1  Total score 30 30     6CIT Screen 10/08/2021 10/07/2020 10/05/2019 08/31/2018 08/30/2017  What Year? 0 points 0 points 0 points 0 points 0 points  What month? 0 points 0 points 0 points 0 points 0 points  What time? 0 points - 0 points 0 points 0 points  Count back from 20 0 points - 0 points 0 points 0 points  Months in reverse 0 points 0 points 0 points 2 points 0 points  Repeat phrase 0 points 0 points - 0 points 0 points  Total Score 0 - - 2 0    Immunizations Immunization History  Administered  Date(s) Administered   Fluad Quad(high Dose 65+) 10/05/2019, 09/19/2020   Influenza, High Dose Seasonal PF 08/28/2015, 08/30/2016, 08/30/2017, 08/31/2018   Influenza-Unspecified 09/21/2014   PFIZER(Purple Top)SARS-COV-2 Vaccination 01/17/2020, 02/11/2020    Pneumococcal Conjugate-13 10/22/2014   Pneumococcal-Unspecified 10/23/2011   Tdap 08/28/2015    TDAP status: Up to date  Flu Vaccine status: Up to date  Pneumococcal vaccine status: Up to date  Covid-19 vaccine status: Completed vaccines  Qualifies for Shingles Vaccine? Yes   Zostavax completed? No. One completed.  Shingrix Completed?: No.    Education has been provided regarding the importance of this vaccine. Patient has been advised to call insurance company to determine out of pocket expense if they have not yet received this vaccine. Advised may also receive vaccine at local pharmacy or Health Dept. Verbalized acceptance and understanding.  Screening Tests Health Maintenance  Topic Date Due   Pneumonia Vaccine 20+ Years old (2 - PPSV23 if available, else PCV20) 10/23/2015   INFLUENZA VACCINE  07/20/2021   COVID-19 Vaccine (3 - Booster for Pfizer series) 10/24/2021 (Originally 04/07/2020)   Zoster Vaccines- Shingrix (1 of 2) 01/08/2022 (Originally 03/16/1986)   TETANUS/TDAP  08/27/2025   HPV VACCINES  Aged Out    Health Maintenance  Health Maintenance Due  Topic Date Due   Pneumonia Vaccine 81+ Years old (2 - PPSV23 if available, else PCV20) 10/23/2015   INFLUENZA VACCINE  07/20/2021    Colorectal cancer screening: No longer required.   Hepatitis C Screening: does not qualify  Vision Screening: Recommended annual ophthalmology exams for early detection of glaucoma and other disorders of the eye. Is the patient up to date with their annual eye exam?  Yes  Who is the provider or what is the name of the office in which the patient attends annual eye exams? Followed by [facility].  Dental Screening: Recommended annual dental exams for proper oral hygiene    Plan:    I have personally reviewed and noted the following in the patient's chart:   Medical and social history Use of alcohol, tobacco or illicit drugs  Current medications and supplements including  opioid prescriptions. Patient is not currently taking opioid prescriptions. Functional ability and status Nutritional status Physical activity Advanced directives List of other physicians Hospitalizations, surgeries, and ER visits in previous 12 months Vitals Screenings to include cognitive, depression, and falls Referrals and appointments  In addition, I have reviewed and discussed with patient certain preventive protocols, quality metrics, and best practice recommendations. A written personalized care plan for preventive services as well as general preventive health recommendations were provided to patient.     Criselda Peaches, LPN   [id number]

## 2021-10-16 ENCOUNTER — Telehealth: Payer: Self-pay | Admitting: Internal Medicine

## 2021-10-16 NOTE — Telephone Encounter (Signed)
error 

## 2021-10-16 NOTE — Telephone Encounter (Signed)
Patient called and said her pharmacy wanted her to call the office and ask if it's ok to ch

## 2021-10-19 ENCOUNTER — Other Ambulatory Visit: Payer: Self-pay

## 2021-10-19 ENCOUNTER — Other Ambulatory Visit
Admission: RE | Admit: 2021-10-19 | Discharge: 2021-10-19 | Disposition: A | Discharge status: Home / Self Care | Payer: PPO | Source: Ambulatory Visit | Attending: Internal Medicine | Admitting: Internal Medicine

## 2021-10-19 DIAGNOSIS — Z20822 Contact with and (suspected) exposure to covid-19: Secondary | ICD-10-CM | POA: Diagnosis not present

## 2021-10-19 DIAGNOSIS — Z01812 Encounter for preprocedural laboratory examination: Secondary | ICD-10-CM | POA: Insufficient documentation

## 2021-10-20 LAB — SARS CORONAVIRUS 2 (TAT 6-24 HRS): SARS Coronavirus 2: NEGATIVE

## 2021-10-21 ENCOUNTER — Ambulatory Visit: Payer: PPO | Attending: Pulmonary Disease

## 2021-10-21 DIAGNOSIS — G4733 Obstructive sleep apnea (adult) (pediatric): Secondary | ICD-10-CM | POA: Insufficient documentation

## 2021-10-22 ENCOUNTER — Other Ambulatory Visit: Payer: Self-pay

## 2021-11-06 ENCOUNTER — Ambulatory Visit (INDEPENDENT_AMBULATORY_CARE_PROVIDER_SITE_OTHER): Payer: PPO | Admitting: Internal Medicine

## 2021-11-06 ENCOUNTER — Other Ambulatory Visit: Payer: Self-pay

## 2021-11-06 VITALS — BP 140/72 | HR 82 | Temp 97.0°F | Resp 16 | Ht 72.0 in | Wt 220.4 lb

## 2021-11-06 DIAGNOSIS — E785 Hyperlipidemia, unspecified: Secondary | ICD-10-CM

## 2021-11-06 DIAGNOSIS — R361 Hematospermia: Secondary | ICD-10-CM | POA: Insufficient documentation

## 2021-11-06 DIAGNOSIS — G4733 Obstructive sleep apnea (adult) (pediatric): Secondary | ICD-10-CM | POA: Diagnosis not present

## 2021-11-06 DIAGNOSIS — I10 Essential (primary) hypertension: Secondary | ICD-10-CM | POA: Diagnosis not present

## 2021-11-06 DIAGNOSIS — Z96659 Presence of unspecified artificial knee joint: Secondary | ICD-10-CM | POA: Diagnosis not present

## 2021-11-06 DIAGNOSIS — R5383 Other fatigue: Secondary | ICD-10-CM | POA: Diagnosis not present

## 2021-11-06 DIAGNOSIS — N400 Enlarged prostate without lower urinary tract symptoms: Secondary | ICD-10-CM

## 2021-11-06 DIAGNOSIS — Z9989 Dependence on other enabling machines and devices: Secondary | ICD-10-CM

## 2021-11-06 DIAGNOSIS — R42 Dizziness and giddiness: Secondary | ICD-10-CM

## 2021-11-06 DIAGNOSIS — D649 Anemia, unspecified: Secondary | ICD-10-CM | POA: Diagnosis not present

## 2021-11-06 LAB — CBC WITH DIFFERENTIAL/PLATELET
Basophils Absolute: 0 10*3/uL (ref 0.0–0.1)
Basophils Relative: 0.7 % (ref 0.0–3.0)
Eosinophils Absolute: 0.2 10*3/uL (ref 0.0–0.7)
Eosinophils Relative: 3.1 % (ref 0.0–5.0)
HCT: 38.2 % — ABNORMAL LOW (ref 39.0–52.0)
Hemoglobin: 12.7 g/dL — ABNORMAL LOW (ref 13.0–17.0)
Lymphocytes Relative: 24.7 % (ref 12.0–46.0)
Lymphs Abs: 1.4 10*3/uL (ref 0.7–4.0)
MCHC: 33.3 g/dL (ref 30.0–36.0)
MCV: 92.3 fl (ref 78.0–100.0)
Monocytes Absolute: 0.6 10*3/uL (ref 0.1–1.0)
Monocytes Relative: 10.7 % (ref 3.0–12.0)
Neutro Abs: 3.4 10*3/uL (ref 1.4–7.7)
Neutrophils Relative %: 60.8 % (ref 43.0–77.0)
Platelets: 191 10*3/uL (ref 150.0–400.0)
RBC: 4.14 Mil/uL — ABNORMAL LOW (ref 4.22–5.81)
RDW: 13.7 % (ref 11.5–15.5)
WBC: 5.6 10*3/uL (ref 4.0–10.5)

## 2021-11-06 LAB — URINALYSIS, ROUTINE W REFLEX MICROSCOPIC
Bilirubin Urine: NEGATIVE
Hgb urine dipstick: NEGATIVE
Ketones, ur: NEGATIVE
Leukocytes,Ua: NEGATIVE
Nitrite: NEGATIVE
RBC / HPF: NONE SEEN (ref 0–?)
Specific Gravity, Urine: 1.02 (ref 1.000–1.030)
Total Protein, Urine: NEGATIVE
Urine Glucose: NEGATIVE
Urobilinogen, UA: 0.2 (ref 0.0–1.0)
pH: 6 (ref 5.0–8.0)

## 2021-11-06 LAB — IBC + FERRITIN
Ferritin: 28.8 ng/mL (ref 22.0–322.0)
Iron: 82 ug/dL (ref 42–165)
Saturation Ratios: 21.3 % (ref 20.0–50.0)
TIBC: 385 ug/dL (ref 250.0–450.0)
Transferrin: 275 mg/dL (ref 212.0–360.0)

## 2021-11-06 LAB — LIPID PANEL
Cholesterol: 159 mg/dL (ref 0–200)
HDL: 60.1 mg/dL (ref >39.00)
LDL Cholesterol: 77 mg/dL (ref 0–99)
NonHDL: 98.47
Total CHOL/HDL Ratio: 3
Triglycerides: 106 mg/dL (ref 0.0–149.0)
VLDL: 21.2 mg/dL (ref 0.0–40.0)

## 2021-11-06 LAB — BASIC METABOLIC PANEL
BUN: 15 mg/dL (ref 6–23)
CO2: 26 mEq/L (ref 19–32)
Calcium: 9.2 mg/dL (ref 8.4–10.5)
Chloride: 105 mEq/L (ref 96–112)
Creatinine, Ser: 0.82 mg/dL (ref 0.40–1.50)
GFR: 81.16 mL/min (ref >60.00)
Glucose, Bld: 98 mg/dL (ref 70–99)
Potassium: 4.2 mEq/L (ref 3.5–5.1)
Sodium: 139 mEq/L (ref 135–145)

## 2021-11-06 LAB — HEPATIC FUNCTION PANEL
ALT: 12 U/L (ref 0–53)
AST: 18 U/L (ref 0–37)
Albumin: 4.5 g/dL (ref 3.5–5.2)
Alkaline Phosphatase: 73 U/L (ref 39–117)
Bilirubin, Direct: 0.2 mg/dL (ref 0.0–0.3)
Total Bilirubin: 0.7 mg/dL (ref 0.2–1.2)
Total Protein: 6.6 g/dL (ref 6.0–8.3)

## 2021-11-06 MED ORDER — LOSARTAN POTASSIUM 50 MG PO TABS: 50.0000 mg | ORAL_TABLET | Freq: Every day | ORAL | 2 refills | Status: DC

## 2021-11-06 NOTE — Patient Instructions (Signed)
Stop losartan 25mg  - one per day.    Start losartan 50mg  - one per day (prescription sent in to pharmacy).    Continue amlodipine.

## 2021-11-06 NOTE — Progress Notes (Signed)
Subjective:    Patient ID: Brian Pearson, male    DOB: 02/03/1936, 85 y.o.   MRN: [id number]  This visit occurred during the SARS-CoV-2 public health emergency.  Safety protocols were in place, including screening questions prior to the visit, additional usage of staff PPE, and extensive cleaning of exam room while observing appropriate contact time as indicated for disinfecting solutions.   Patient here for a scheduled follow up.  Chief Complaint  Patient presents with   Hypertension   .   HPI Diagnosed with severe sleep apnea.  Had f/u with Dr Mortimer Fries 09/23/21.  Wearing cpap.  Compliant.  Is s/p knee surgery.  Has been to therapy.  Trying to stay active.  No chest pain or sob reported.  Does report intermittent episodes - head does not feel right.  Hard to describe.  Intermittent for several months.  No actual dizziness.  No headache.  Lying down - does not bother him.  No abdominal pain.  No bowel change reported.  Does report noticing - blood in semen.  No dysuria.  No hematuria.     Past Medical History:  Diagnosis Date   Arthritis    BPH with obstruction/lower urinary tract symptoms    GERD (gastroesophageal reflux disease)    Hyperlipidemia    taken off simvastatin last year   Hypertension    Obesity    Pneumonia    hospitalized    Sleep apnea    uses cpap machine    Urinary frequency    Urine incontinence    Past Surgical History:  Procedure Laterality Date   COLONOSCOPY WITH PROPOFOL N/A 02/09/2016   Procedure: COLONOSCOPY WITH PROPOFOL;  Surgeon: Hulen Luster, MD;  Location: [facility] ENDOSCOPY;  Service: Gastroenterology;  Laterality: N/A;   ESOPHAGOGASTRODUODENOSCOPY (EGD) WITH PROPOFOL N/A 02/09/2016   Procedure: ESOPHAGOGASTRODUODENOSCOPY (EGD) WITH PROPOFOL;  Surgeon: Hulen Luster, MD;  Location: [facility] ENDOSCOPY;  Service: Gastroenterology;  Laterality: N/A;   HERNIA REPAIR     1990's - 3 surgeries   KNEE ARTHROPLASTY Left 04/01/2021   Procedure: COMPUTER ASSISTED TOTAL KNEE  ARTHROPLASTY;  Surgeon: Dereck Leep, MD;  Location: [facility] ORS;  Service: Orthopedics;  Laterality: Left;   Family History  Problem Relation Age of Onset   Arthritis Mother    Heart disease Mother        CHF   Cancer Father        Prostate   Dementia Father    Alzheimer's disease Father    Hypotension Sister    Kidney cancer Neg Hx    Bladder Cancer Neg Hx    Social History   Socioeconomic History   Marital status: Married    Spouse name: Not on file   Number of children: Not on file   Years of education: Not on file   Highest education level: Not on file  Occupational History   Not on file  Tobacco Use   Smoking status: Never   Smokeless tobacco: Never  Vaping Use   Vaping Use: Never used  Substance and Sexual Activity   Alcohol use: Yes    Alcohol/week: 0.0 standard drinks    Comment: rarely    Drug use: No   Sexual activity: Yes  Other Topics Concern   Not on file  Social History Narrative   Lives in [place].      Work - retired, works part time driving for [employer] - golf in past  Exercise - stationary bike      Diet - regular      Served in [employer]   Social Determinants of Health   Financial Resource Strain: Low Risk    Difficulty of Paying Living Expenses: Not hard at all  Food Insecurity: No Food Insecurity   Worried About Charity fundraiser in the Last Year: Never true   Arboriculturist in the Last Year: Never true  Transportation Needs: No Transportation Needs   Lack of Transportation (Medical): No   Lack of Transportation (Non-Medical): No  Physical Activity: Sufficiently Active   Days of Exercise per Week: 5 days   Minutes of Exercise per Session: 30 min  Stress: No Stress Concern Present   Feeling of Stress : Not at all  Social Connections: Moderately Integrated   Frequency of Communication with Friends and Family: Three times a week   Frequency of Social Gatherings with Friends and Family: More  than three times a week   Attends Religious Services: 1 to 4 times per year   Active Member of [employer] or Organizations: No   Attends Archivist Meetings: Never   Marital Status: Married     Review of Systems  Constitutional:  Negative for appetite change and unexpected weight change.  HENT:  Negative for congestion and sinus pressure.   Respiratory:  Negative for cough, chest tightness and shortness of breath.   Cardiovascular:  Negative for chest pain, palpitations and leg swelling.  Gastrointestinal:  Negative for abdominal pain, diarrhea, nausea and vomiting.  Genitourinary:  Negative for difficulty urinating and dysuria.       Blood in semen as outlined.    Skin:  Negative for color change and rash.  Neurological:  Negative for headaches.       Describes head not feeling right as outlined.    Psychiatric/Behavioral:  Negative for agitation and dysphoric mood.       Objective:     BP 140/72   Pulse 82   Temp (!) 97 F (36.1 C)   Resp 16   Ht 6' (1.829 m)   Wt 220 lb 6.4 oz (100 kg)   SpO2 98%   BMI 29.89 kg/m  Wt Readings from Last 3 Encounters:  11/06/21 220 lb 6.4 oz (100 kg)  10/08/21 220 lb (99.8 kg)  09/23/21 220 lb (99.8 kg)    Physical Exam Constitutional:      General: He is not in acute distress.    Appearance: Normal appearance. He is well-developed.  HENT:     Head: Normocephalic and atraumatic.     Right Ear: External ear normal.     Left Ear: External ear normal.  Eyes:     General: No scleral icterus.       Right eye: No discharge.        Left eye: No discharge.  Cardiovascular:     Rate and Rhythm: Normal rate and regular rhythm.  Pulmonary:     Effort: Pulmonary effort is normal. No respiratory distress.     Breath sounds: Normal breath sounds.  Abdominal:     General: Bowel sounds are normal.     Palpations: Abdomen is soft.     Tenderness: There is no abdominal tenderness.  Musculoskeletal:        General: No swelling or  tenderness.     Cervical back: Neck supple. No tenderness.  Lymphadenopathy:     Cervical: No cervical adenopathy.  Skin:  Findings: No erythema or rash.  Neurological:     Mental Status: He is alert.  Psychiatric:        Mood and Affect: Mood normal.        Behavior: Behavior normal.     Outpatient Encounter Medications as of 11/06/2021  Medication Sig   losartan (COZAAR) 50 MG tablet Take 1 tablet (50 mg total) by mouth daily.   amLODipine (NORVASC) 10 MG tablet Take 1 tablet by mouth once daily   Cholecalciferol (VITAMIN D-3) 1000 units CAPS Take 1,000 Units by mouth daily.   Coenzyme Q10 (COQ10 PO) Take 1 capsule by mouth daily.   finasteride (PROSCAR) 5 MG tablet Take 1 tablet by mouth once daily   Multiple Vitamin (MULTI-VITAMINS) TABS Take 1 tablet by mouth daily.   Omega-3 Fatty Acids (FISH OIL PO) Take 1 capsule by mouth daily.   omeprazole (PRILOSEC) 20 MG capsule Take 1 capsule by mouth once daily   simvastatin (ZOCOR) 20 MG tablet TAKE 1 TABLET BY MOUTH AT BEDTIME (NEEDS  APPOINTMENT)   tamsulosin (FLOMAX) 0.4 MG CAPS capsule Take 1 capsule by mouth once daily   [DISCONTINUED] celecoxib (CELEBREX) 200 MG capsule Take 1 capsule (200 mg total) by mouth 2 (two) times daily. (Patient not taking: Reported on 09/23/2021)   [DISCONTINUED] losartan (COZAAR) 25 MG tablet Take 1 tablet by mouth once daily   No facility-administered encounter medications on file as of 11/06/2021.     Lab Results  Component Value Date   WBC 5.6 11/06/2021   HGB 12.7 (L) 11/06/2021   HCT 38.2 (L) 11/06/2021   PLT 191.0 11/06/2021   GLUCOSE 98 11/06/2021   CHOL 159 11/06/2021   TRIG 106.0 11/06/2021   HDL 60.10 11/06/2021   LDLCALC 77 11/06/2021   ALT 12 11/06/2021   AST 18 11/06/2021   NA 139 11/06/2021   K 4.2 11/06/2021   CL 105 11/06/2021   CREATININE 0.82 11/06/2021   BUN 15 11/06/2021   CO2 26 11/06/2021   TSH 2.96 06/29/2021   PSA 1.24 06/26/2021   INR 1.0 03/23/2021    MICROALBUR 0.7 10/22/2014       Assessment & Plan:   Problem List Items Addressed This Visit     Anemia    Check cbc and iron studies.        Relevant Orders   IBC + Ferritin (Completed)   Benign prostatic hyperplasia without urinary obstruction    Has been followed by urology.  On tamsulosin and finasteride.  No urinary symptoms reported.        Blood in semen    Reported noticing blood in semen.  Check urinalysis.  Sees urology.  D/w urology regarding further w/up and evaluation       Relevant Orders   Urinalysis, Routine w reflex microscopic (Completed)   Urine Culture (Completed)   Essential hypertension - Primary    Continue losartan and amlodipine.  Pressures as outlined.   Given elevation, will increase losartan to 50mg  q day. Follow pressures.  Follow metabolic panel.       Relevant Medications   losartan (COZAAR) 50 MG tablet   Other Relevant Orders   Basic metabolic panel (Completed)   Fatigue   Relevant Orders   CBC with Differential/Platelet (Completed)   Hyperlipidemia    On simvastatin.  Low cholesterol diet and exercise.  Follow lipid panel and liver function tests.        Relevant Medications   losartan (COZAAR) 50 MG tablet  Other Relevant Orders   Lipid panel (Completed)   Hepatic function panel (Completed)   Light headedness    Describes the intermittent episodes of his head "not feeling right".  No known triggers.  No low blood pressures or sugar documented.  No sinus congestion or symptoms.  Treat blood pressure for optimal control.  Discussed given persistence, further w/up and evaluation. Check MRI brain to confirm no acute abnormality.       Relevant Orders   MR Brain W Wo Contrast   OSA on CPAP    Diagnosed with severe sleep apnea.  Compliant with cpap.  Follow.       Total knee replacement status    S/p knee surgery.  Has been going to therapy.  Doing well.  Continue to follow up with ortho.         [name], MD

## 2021-11-08 LAB — URINE CULTURE
MICRO NUMBER:: 12657389
Result:: NO GROWTH
SPECIMEN QUALITY:: ADEQUATE

## 2021-11-14 ENCOUNTER — Encounter: Payer: Self-pay | Admitting: Internal Medicine

## 2021-11-14 NOTE — Assessment & Plan Note (Signed)
Reported noticing blood in semen.  Check urinalysis.  Sees urology.  D/w urology regarding further w/up and evaluation

## 2021-11-14 NOTE — Assessment & Plan Note (Signed)
Diagnosed with severe sleep apnea.  Compliant with cpap.  Follow.

## 2021-11-14 NOTE — Assessment & Plan Note (Signed)
S/p knee surgery.  Has been going to therapy.  Doing well.  Continue to follow up with ortho.

## 2021-11-14 NOTE — Assessment & Plan Note (Signed)
Has been followed by urology.  On tamsulosin and finasteride.  No urinary symptoms reported.

## 2021-11-14 NOTE — Assessment & Plan Note (Addendum)
Continue losartan and amlodipine.  Pressures as outlined.   Given elevation, will increase losartan to 50mg  q day. Follow pressures.  Follow metabolic panel.

## 2021-11-14 NOTE — Assessment & Plan Note (Signed)
On simvastatin.  Low cholesterol diet and exercise.  Follow lipid panel and liver function tests.   

## 2021-11-14 NOTE — Assessment & Plan Note (Addendum)
Describes the intermittent episodes of his head "not feeling right".  No known triggers.  No low blood pressures or sugar documented.  No sinus congestion or symptoms.  Treat blood pressure for optimal control.  Discussed given persistence, further w/up and evaluation. Check MRI brain to confirm no acute abnormality.

## 2021-11-14 NOTE — Assessment & Plan Note (Signed)
Check cbc and iron studies.   

## 2021-11-18 ENCOUNTER — Other Ambulatory Visit: Payer: Self-pay | Admitting: Internal Medicine

## 2021-11-18 DIAGNOSIS — E785 Hyperlipidemia, unspecified: Secondary | ICD-10-CM

## 2021-11-23 NOTE — Progress Notes (Signed)
11/24/2021 1:39 PM   Brian Pearson 12/21/1935 [id number]  Referring provider: [name], [address] [place],  [place] [address]  Chief Complaint  Patient presents with   Follow-up    Urological history: 1. BPH with LU TS -PSA 1.24 in 06/2021 -I PSS 22/3 -PVR 0 mL -managed with tamsulosin 0.4 mg daily and finasteride 5 mg daily  2. Nocturia -improved with use of CPAP   3. ED -contributing factors of age, BPH, HTN and HLD -SHIM 13  4. Urinary retention -post-op 03/2020  HPI: Brian Pearson is a 85 y.o. male who presents today for blood in the semen.    UA negative.  Four weeks ago, he noted blood in his semen.  He states it was right much blood in the semen.  He did not experience any pain with the ejaculation.  He has no sudden changes in urination.  He has not had any scrotal pain, penile pain or perineal pain.  No discharge from the penis.    Patient denies any modifying or aggravating factors.  Patient denies any gross hematuria, dysuria or suprapubic/flank pain.  Patient denies any fevers, chills, nausea or vomiting.    PMH: Past Medical History:  Diagnosis Date   Arthritis    BPH with obstruction/lower urinary tract symptoms    GERD (gastroesophageal reflux disease)    Hyperlipidemia    taken off simvastatin last year   Hypertension    Obesity    Pneumonia    hospitalized    Sleep apnea    uses cpap machine    Urinary frequency    Urine incontinence     Surgical History: Past Surgical History:  Procedure Laterality Date   COLONOSCOPY WITH PROPOFOL N/A 02/09/2016   Procedure: COLONOSCOPY WITH PROPOFOL;  Surgeon: Hulen Luster, MD;  Location: [facility] ENDOSCOPY;  Service: Gastroenterology;  Laterality: N/A;   ESOPHAGOGASTRODUODENOSCOPY (EGD) WITH PROPOFOL N/A 02/09/2016   Procedure: ESOPHAGOGASTRODUODENOSCOPY (EGD) WITH PROPOFOL;  Surgeon: Hulen Luster, MD;  Location: [facility] ENDOSCOPY;  Service: Gastroenterology;  Laterality:  N/A;   HERNIA REPAIR     1990's - 3 surgeries   KNEE ARTHROPLASTY Left 04/01/2021   Procedure: COMPUTER ASSISTED TOTAL KNEE ARTHROPLASTY;  Surgeon: Dereck Leep, MD;  Location: [facility] ORS;  Service: Orthopedics;  Laterality: Left;    Home Medications:  Allergies as of 11/24/2021   No Known Allergies      Medication List        Accurate as of November 24, 2021  1:39 PM. If you have any questions, ask your nurse or doctor.          amLODipine 10 MG tablet Commonly known as: NORVASC Take 1 tablet by mouth once daily   COQ10 PO Take 1 capsule by mouth daily.   Cyanocobalamin 5000 MCG Subl Place 5,000 mcg under the tongue daily.   finasteride 5 MG tablet Commonly known as: PROSCAR Take 1 tablet by mouth once daily   FISH OIL PO Take 1 capsule by mouth daily.   losartan 50 MG tablet Commonly known as: COZAAR Take 1 tablet (50 mg total) by mouth daily.   Multi-Vitamins Tabs Take 1 tablet by mouth daily.   omeprazole 20 MG capsule Commonly known as: PRILOSEC Take 1 capsule by mouth once daily   simvastatin 20 MG tablet Commonly known as: ZOCOR TAKE 1 TABLET BY MOUTH AT BEDTIME -  NEED  APPT   tamsulosin 0.4 MG Caps capsule Commonly known as:  FLOMAX Take 1 capsule by mouth once daily   Vitamin D-3 25 MCG (1000 UT) Caps Take 1,000 Units by mouth daily.        Allergies: No Known Allergies  Family History: Family History  Problem Relation Age of Onset   Arthritis Mother    Heart disease Mother        CHF   Cancer Father        Prostate   Dementia Father    Alzheimer's disease Father    Hypotension Sister    Kidney cancer Neg Hx    Bladder Cancer Neg Hx     Social History:  reports that he has never smoked. He has never used smokeless tobacco. He reports current alcohol use. He reports that he does not use drugs.  ROS: Pertinent ROS in HPI  Physical Exam: Blood pressure (!) 177/76, pulse 85, height 6' (1.829 m), weight 220 lb (99.8 kg).   Constitutional:  Well nourished. Alert and oriented, No acute distress. HEENT: [place] AT, mask in place.  Trachea midline Cardiovascular: No clubbing, cyanosis, or edema. Respiratory: Normal respiratory effort, no increased work of breathing. GU: No CVA tenderness.  No bladder fullness or masses.  Patient with uncircumcised phallus. Foreskin easily retracted  Urethral meatus is patent.  No penile discharge. No penile lesions or rashes. Scrotum without lesions, cysts, rashes and/or edema.  Testicles are located scrotally bilaterally. No masses are appreciated in the testicles. Left and right epididymis are normal. Neurologic: Grossly intact, no focal deficits, moving all 4 extremities. Psychiatric: Normal mood and affect.   Laboratory Data: Lab Results  Component Value Date   WBC 5.6 11/06/2021   HGB 12.7 (L) 11/06/2021   HCT 38.2 (L) 11/06/2021   MCV 92.3 11/06/2021   PLT 191.0 11/06/2021    Lab Results  Component Value Date   CREATININE 0.82 11/06/2021    Lab Results  Component Value Date   PSA 1.24 06/26/2021   PSA 1.08 10/05/2019   PSA 0.92 07/19/2017     Lab Results  Component Value Date   TSH 2.96 06/29/2021       Component Value Date/Time   CHOL 159 11/06/2021 1024   HDL 60.10 11/06/2021 1024   CHOLHDL 3 11/06/2021 1024   VLDL 21.2 11/06/2021 1024   LDLCALC 77 11/06/2021 1024    Lab Results  Component Value Date   AST 18 11/06/2021   Lab Results  Component Value Date   ALT 12 11/06/2021    Urinalysis Component     Latest Ref Rng & Units 11/24/2021  Specific Gravity, UA     1.005 - 1.030 1.010  pH, UA     5.0 - 7.5 6.5  Color, UA     Yellow Yellow  Appearance Ur     Clear Clear  Leukocytes,UA     Negative Negative  Protein,UA     Negative/Trace Negative  Glucose, UA     Negative Negative  Ketones, UA     Negative Negative  RBC, UA     Negative Negative  Bilirubin, UA     Negative Negative  Urobilinogen, Ur     0.2 - 1.0 mg/dL 0.2   Nitrite, UA     Negative Negative  Microscopic Examination      See below:   Component     Latest Ref Rng & Units 11/24/2021  WBC, UA     0 - 5 /hpf 0-5  RBC     0 - 2 /hpf  None seen  Epithelial Cells (non renal)     0 - 10 /hpf 0-10  Bacteria, UA     None seen/Few None seen   I have reviewed the labs.    Pertinent Imaging: N/A  Assessment & Plan:    1. Hematospermia - I explained to the patient some of the conditions that may cause hematospermia, such as: disorders of the prostate gland, seminal vesicles, spermatic cord, and ejaculatory duct system; urogenital infections including sexually transmitted infections (eg, chlamydia, herpes simplex virus, gonorrhea, trichomonas); metastatic cancers; vascular malformations; congenital and drug-induced bleeding disorders; and even frequent daily ejaculation over a period of several weeks.  I reassured him that his exam was normal and that we may never discover a reason for his hematospermia and it is most likely a benign symptom.    2. BPH with LU TS -continue finasteride 5 mg daily and tamsulosin 0.4 mg daily    Return for keep follow up in May 2023.  These notes generated with voice recognition software. I apologize for typographical errors.  Zara Council, PA-C  [facility] [address]  [place], [place] [address] [phone]

## 2021-11-24 ENCOUNTER — Encounter: Payer: Self-pay | Admitting: Urology

## 2021-11-24 ENCOUNTER — Ambulatory Visit: Payer: PPO | Admitting: Urology

## 2021-11-24 ENCOUNTER — Other Ambulatory Visit: Payer: Self-pay

## 2021-11-24 VITALS — BP 177/76 | HR 85 | Ht 72.0 in | Wt 220.0 lb

## 2021-11-24 DIAGNOSIS — N138 Other obstructive and reflux uropathy: Secondary | ICD-10-CM | POA: Diagnosis not present

## 2021-11-24 DIAGNOSIS — N401 Enlarged prostate with lower urinary tract symptoms: Secondary | ICD-10-CM

## 2021-11-24 DIAGNOSIS — R361 Hematospermia: Secondary | ICD-10-CM

## 2021-11-24 DIAGNOSIS — N529 Male erectile dysfunction, unspecified: Secondary | ICD-10-CM

## 2021-11-24 LAB — MICROSCOPIC EXAMINATION
Bacteria, UA: NONE SEEN
RBC, Urine: NONE SEEN /hpf (ref 0–2)

## 2021-11-24 LAB — URINALYSIS, COMPLETE
Bilirubin, UA: NEGATIVE
Glucose, UA: NEGATIVE
Ketones, UA: NEGATIVE
Leukocytes,UA: NEGATIVE
Nitrite, UA: NEGATIVE
Protein,UA: NEGATIVE
RBC, UA: NEGATIVE
Specific Gravity, UA: 1.01 (ref 1.005–1.030)
Urobilinogen, Ur: 0.2 mg/dL (ref 0.2–1.0)
pH, UA: 6.5 (ref 5.0–7.5)

## 2021-11-26 ENCOUNTER — Ambulatory Visit
Admission: RE | Admit: 2021-11-26 | Discharge: 2021-11-26 | Disposition: A | Discharge status: Home / Self Care | Payer: PPO | Source: Ambulatory Visit | Attending: Internal Medicine | Admitting: Internal Medicine

## 2021-11-26 DIAGNOSIS — R42 Dizziness and giddiness: Secondary | ICD-10-CM | POA: Diagnosis not present

## 2021-11-26 MED ORDER — GADOBUTROL 1 MMOL/ML IV SOLN
10.0000 mL | Freq: Once | INTRAVENOUS | Status: AC | PRN
  Administered 2021-11-26: 10 mL via INTRAVENOUS

## 2021-12-01 ENCOUNTER — Telehealth: Payer: Self-pay | Admitting: Internal Medicine

## 2021-12-01 ENCOUNTER — Other Ambulatory Visit: Payer: Self-pay | Admitting: Internal Medicine

## 2021-12-01 DIAGNOSIS — R42 Dizziness and giddiness: Secondary | ICD-10-CM

## 2021-12-01 NOTE — Telephone Encounter (Signed)
See result note.  

## 2021-12-01 NOTE — Progress Notes (Signed)
Order placed for neurology referral.   

## 2021-12-01 NOTE — Telephone Encounter (Signed)
Pt wife called in stating that Pt went and had a MRI done. Pt wife stated that they never received a call about Pt results. Pt requesting callback at [phone].

## 2022-01-06 ENCOUNTER — Other Ambulatory Visit: Payer: Self-pay | Admitting: Internal Medicine

## 2022-01-15 DIAGNOSIS — G4733 Obstructive sleep apnea (adult) (pediatric): Secondary | ICD-10-CM | POA: Diagnosis not present

## 2022-01-20 ENCOUNTER — Other Ambulatory Visit: Payer: Self-pay | Admitting: Internal Medicine

## 2022-01-20 DIAGNOSIS — R1310 Dysphagia, unspecified: Secondary | ICD-10-CM

## 2022-01-20 DIAGNOSIS — G5603 Carpal tunnel syndrome, bilateral upper limbs: Secondary | ICD-10-CM | POA: Diagnosis not present

## 2022-02-01 ENCOUNTER — Other Ambulatory Visit: Payer: Self-pay | Admitting: Internal Medicine

## 2022-02-01 DIAGNOSIS — G5603 Carpal tunnel syndrome, bilateral upper limbs: Secondary | ICD-10-CM | POA: Diagnosis not present

## 2022-02-01 DIAGNOSIS — M47812 Spondylosis without myelopathy or radiculopathy, cervical region: Secondary | ICD-10-CM | POA: Diagnosis not present

## 2022-02-01 DIAGNOSIS — R42 Dizziness and giddiness: Secondary | ICD-10-CM | POA: Diagnosis not present

## 2022-02-02 ENCOUNTER — Encounter: Payer: Self-pay | Admitting: Internal Medicine

## 2022-02-02 ENCOUNTER — Other Ambulatory Visit: Payer: Self-pay

## 2022-02-02 ENCOUNTER — Telehealth: Payer: Self-pay | Admitting: Internal Medicine

## 2022-02-02 ENCOUNTER — Ambulatory Visit (INDEPENDENT_AMBULATORY_CARE_PROVIDER_SITE_OTHER): Payer: PPO | Admitting: Internal Medicine

## 2022-02-02 VITALS — BP 136/74 | HR 83 | Temp 97.8°F | Resp 16 | Ht 73.0 in | Wt 222.0 lb

## 2022-02-02 DIAGNOSIS — G4733 Obstructive sleep apnea (adult) (pediatric): Secondary | ICD-10-CM

## 2022-02-02 DIAGNOSIS — E785 Hyperlipidemia, unspecified: Secondary | ICD-10-CM

## 2022-02-02 DIAGNOSIS — R5383 Other fatigue: Secondary | ICD-10-CM

## 2022-02-02 DIAGNOSIS — Z01818 Encounter for other preprocedural examination: Secondary | ICD-10-CM | POA: Diagnosis not present

## 2022-02-02 DIAGNOSIS — D649 Anemia, unspecified: Secondary | ICD-10-CM

## 2022-02-02 DIAGNOSIS — I10 Essential (primary) hypertension: Secondary | ICD-10-CM

## 2022-02-02 DIAGNOSIS — Z9989 Dependence on other enabling machines and devices: Secondary | ICD-10-CM | POA: Diagnosis not present

## 2022-02-02 DIAGNOSIS — R42 Dizziness and giddiness: Secondary | ICD-10-CM

## 2022-02-02 DIAGNOSIS — N400 Enlarged prostate without lower urinary tract symptoms: Secondary | ICD-10-CM

## 2022-02-02 DIAGNOSIS — R55 Syncope and collapse: Secondary | ICD-10-CM

## 2022-02-02 DIAGNOSIS — M542 Cervicalgia: Secondary | ICD-10-CM

## 2022-02-02 DIAGNOSIS — R202 Paresthesia of skin: Secondary | ICD-10-CM | POA: Diagnosis not present

## 2022-02-02 LAB — VITAMIN D 25 HYDROXY (VIT D DEFICIENCY, FRACTURES): VITD: 110.62 ng/mL (ref 30.00–100.00)

## 2022-02-02 LAB — CBC WITH DIFFERENTIAL/PLATELET
Basophils Absolute: 0 10*3/uL (ref 0.0–0.1)
Basophils Relative: 0.4 % (ref 0.0–3.0)
Eosinophils Absolute: 0.1 10*3/uL (ref 0.0–0.7)
Eosinophils Relative: 2.9 % (ref 0.0–5.0)
HCT: 41.1 % (ref 39.0–52.0)
Hemoglobin: 13.5 g/dL (ref 13.0–17.0)
Lymphocytes Relative: 27.5 % (ref 12.0–46.0)
Lymphs Abs: 1.4 10*3/uL (ref 0.7–4.0)
MCHC: 32.8 g/dL (ref 30.0–36.0)
MCV: 93.5 fl (ref 78.0–100.0)
Monocytes Absolute: 0.6 10*3/uL (ref 0.1–1.0)
Monocytes Relative: 11.2 % (ref 3.0–12.0)
Neutro Abs: 2.9 10*3/uL (ref 1.4–7.7)
Neutrophils Relative %: 58 % (ref 43.0–77.0)
Platelets: 204 10*3/uL (ref 150.0–400.0)
RBC: 4.39 Mil/uL (ref 4.22–5.81)
RDW: 13.5 % (ref 11.5–15.5)
WBC: 4.9 10*3/uL (ref 4.0–10.5)

## 2022-02-02 LAB — FOLATE: Folate: >24.2 ng/mL (ref >5.9)

## 2022-02-02 LAB — HEMOGLOBIN A1C: Hgb A1c MFr Bld: 5.8 % (ref 4.6–6.5)

## 2022-02-02 NOTE — Telephone Encounter (Signed)
The other labs are pending.  Vitamin D level resulted - elevated.  Have him stop his otc vitamin D.  When the other labs are resulted, I will send a message.

## 2022-02-02 NOTE — Telephone Encounter (Signed)
CRITICAL VALUE STICKER  CRITICAL VALUE: Vitamin D 110.6  RECEIVER (on-site recipient of call): Sharee Pimple  DATE & TIME NOTIFIED: 02/02/2022 2:57 pm  MESSENGER (representative from lab): Hope  MD NOTIFIED: Dr. Nicki Reaper  TIME OF NOTIFICATION: 3:00pm  RESPONSE:

## 2022-02-02 NOTE — Progress Notes (Signed)
Patient ID: Brian Pearson, male   DOB: 01-06-1936, 86 y.o.   MRN: [id number]   Subjective:    Patient ID: Brian Pearson, male    DOB: Sep 01, 1936, 86 y.o.   MRN: [id number]  This visit occurred during the SARS-CoV-2 public health emergency.  Safety protocols were in place, including screening questions prior to the visit, additional usage of staff PPE, and extensive cleaning of exam room while observing appropriate contact time as indicated for disinfecting solutions.   Patient here for a scheduled follow up appt .   HPI Here to follow up regarding persistent dizziness.  Saw Dr Manuella Ghazi. MRI Head W/Wo Contrast 11/26/2021: No evidence of recent infarction, hemorrhage, or mass. No abnormal enhancement. Minor chronic microvascular ischemic changes.  He reports having persistent light headedness when looking up, etc.  He also reports intermittent episodes that occur when standing. States he feels like he is going to pass out.  Last episode - at church.  Feels he needs to sit down.  Denies any problems driving.  MRI as outlined.  Dr Manuella Ghazi requested f/u labs here and requested cardiology evaluation to r/o arrhythmia.  Also order placed for vestibular rehab.  He denies any chest pain.  Breathing stable.  No increased cough or congestion.  Is eating.  No nausea or vomiting.  He is currently scheduled next Monday for CTS surgery.  Discussed may need to postpone until above issues are sorted through.  Using cpap.  Does have severe cervical spinal stenosis - which could be contributing to above as well.     Past Medical History:  Diagnosis Date   Arthritis    BPH with obstruction/lower urinary tract symptoms    GERD (gastroesophageal reflux disease)    Hyperlipidemia    taken off simvastatin last year   Hypertension    Obesity    Pneumonia    hospitalized    Sleep apnea    uses cpap machine    Urinary frequency    Urine incontinence    Past Surgical History:  Procedure Laterality Date   COLONOSCOPY  WITH PROPOFOL N/A 02/09/2016   Procedure: COLONOSCOPY WITH PROPOFOL;  Surgeon: Hulen Luster, MD;  Location: [facility] ENDOSCOPY;  Service: Gastroenterology;  Laterality: N/A;   ESOPHAGOGASTRODUODENOSCOPY (EGD) WITH PROPOFOL N/A 02/09/2016   Procedure: ESOPHAGOGASTRODUODENOSCOPY (EGD) WITH PROPOFOL;  Surgeon: Hulen Luster, MD;  Location: [facility] ENDOSCOPY;  Service: Gastroenterology;  Laterality: N/A;   HERNIA REPAIR     1990's - 3 surgeries   KNEE ARTHROPLASTY Left 04/01/2021   Procedure: COMPUTER ASSISTED TOTAL KNEE ARTHROPLASTY;  Surgeon: Dereck Leep, MD;  Location: [facility] ORS;  Service: Orthopedics;  Laterality: Left;   Family History  Problem Relation Age of Onset   Arthritis Mother    Heart disease Mother        CHF   Cancer Father        Prostate   Dementia Father    Alzheimer's disease Father    Hypotension Sister    Kidney cancer Neg Hx    Bladder Cancer Neg Hx    Social History   Socioeconomic History   Marital status: Married    Spouse name: Not on file   Number of children: Not on file   Years of education: Not on file   Highest education level: Not on file  Occupational History   Not on file  Tobacco Use   Smoking status: Never   Smokeless tobacco: Never  Vaping Use   Vaping Use:  Never used  Substance and Sexual Activity   Alcohol use: Yes    Alcohol/week: 0.0 standard drinks    Comment: rarely    Drug use: No   Sexual activity: Yes  Other Topics Concern   Not on file  Social History Narrative   Lives in [place].      Work - retired, works part time driving for [employer] - golf in past      [place] - regular      Served in [employer] Strain: Low Risk    Difficulty of Paying Living Expenses: Not hard at [employer] Insecurity: No [place]: Never true   Arboriculturist in the Last Year: Never  true  Transportation Needs: No Transportation Needs   Lack of Transportation (Medical): No   Lack of Transportation (Non-Medical): No  Physical Activity: Sufficiently Active   Days of Exercise per Week: 5 days   Minutes of Exercise per Session: 30 min  Stress: No Stress Concern Present   Feeling of Stress : Not at all  Social Connections: Moderately Integrated   Frequency of Communication with Friends and Family: Three times a week   Frequency of Social Gatherings with Friends and Family: More than three times a week   Attends Religious Services: 1 to 4 times per year   Active Member of [employer] or Organizations: No   Attends Archivist Meetings: Never   Marital Status: Married     Review of Systems  Constitutional:  Negative for appetite change and unexpected weight change.  HENT:  Negative for congestion and sinus pressure.   Respiratory:  Negative for cough, chest tightness and shortness of breath.   Cardiovascular:  Negative for chest pain, palpitations and leg swelling.  Gastrointestinal:  Negative for abdominal pain, diarrhea, nausea and vomiting.  Genitourinary:  Negative for difficulty urinating and dysuria.  Musculoskeletal:  Positive for neck pain. Negative for joint swelling and myalgias.  Skin:  Negative for color change and rash.  Neurological:  Positive for dizziness and light-headedness. Negative for headaches.  Psychiatric/Behavioral:  Negative for agitation and dysphoric mood.       Objective:     BP 136/74    Pulse 83    Temp 97.8 F (36.6 C)    Resp 16    Ht '6\' 1"'  (1.854 m)    Wt 222 lb (100.7 kg)    SpO2 98%    BMI 29.29 kg/m  Wt Readings from Last 3 Encounters:  02/02/22 222 lb (100.7 kg)  11/24/21 220 lb (99.8 kg)  11/06/21 220 lb 6.4 oz (100 kg)    Physical Exam Constitutional:      General: He is not in acute distress.    Appearance: Normal appearance. He is well-developed.  HENT:     Head: Normocephalic and atraumatic.     Right Ear:  External ear normal.     Left Ear: External ear normal.  Eyes:     General: No scleral icterus.       Right eye: No discharge.        Left eye: No discharge.  Cardiovascular:     Rate and Rhythm: Normal rate and regular rhythm.  Pulmonary:     Effort: Pulmonary effort is normal. No  respiratory distress.     Breath sounds: Normal breath sounds.  Abdominal:     General: Bowel sounds are normal.     Palpations: Abdomen is soft.     Tenderness: There is no abdominal tenderness.  Musculoskeletal:        General: No swelling or tenderness.     Cervical back: Neck supple. No tenderness.  Lymphadenopathy:     Cervical: No cervical adenopathy.  Skin:    Findings: No erythema or rash.  Neurological:     Mental Status: He is alert.  Psychiatric:        Mood and Affect: Mood normal.        Behavior: Behavior normal.     Outpatient Encounter Medications as of 02/02/2022  Medication Sig   amLODipine (NORVASC) 10 MG tablet Take 1 tablet by mouth once daily   Cholecalciferol (VITAMIN D-3) 1000 units CAPS Take 1,000 Units by mouth daily.   Coenzyme Q10 (COQ10 PO) Take 1 capsule by mouth daily.   Cyanocobalamin 5000 MCG SUBL Place 5,000 mcg under the tongue daily.   finasteride (PROSCAR) 5 MG tablet Take 1 tablet by mouth once daily   losartan (COZAAR) 50 MG tablet Take 1 tablet by mouth once daily   Multiple Vitamin (MULTI-VITAMINS) TABS Take 1 tablet by mouth daily.   Omega-3 Fatty Acids (FISH OIL PO) Take 1 capsule by mouth daily.   omeprazole (PRILOSEC) 20 MG capsule Take 1 capsule by mouth once daily   simvastatin (ZOCOR) 20 MG tablet TAKE 1 TABLET BY MOUTH AT BEDTIME -  NEED  APPT   tamsulosin (FLOMAX) 0.4 MG CAPS capsule Take 1 capsule by mouth once daily   No facility-administered encounter medications on file as of 02/02/2022.     Lab Results  Component Value Date   WBC 4.9 02/02/2022   HGB 13.5 02/02/2022   HCT 41.1 02/02/2022   PLT 204.0 02/02/2022   GLUCOSE 97 02/02/2022    CHOL 159 11/06/2021   TRIG 106.0 11/06/2021   HDL 60.10 11/06/2021   LDLCALC 77 11/06/2021   ALT 15 02/02/2022   AST 18 02/02/2022   NA 141 02/02/2022   K 3.9 02/02/2022   CL 105 02/02/2022   CREATININE 0.84 02/02/2022   BUN 18 02/02/2022   CO2 24 02/02/2022   TSH 2.96 06/29/2021   PSA 1.24 06/26/2021   INR 1.0 03/23/2021   HGBA1C 5.8 02/02/2022   MICROALBUR 0.7 10/22/2014    MR Brain W Wo Contrast  Result Date: 11/26/2021 CLINICAL DATA:  Dizziness, non-specific. Patient reports lightheadedness especially when looking up for several months. EXAM: MRI HEAD WITHOUT AND WITH CONTRAST TECHNIQUE: Multiplanar, multiecho pulse sequences of the brain and surrounding structures were obtained without and with intravenous contrast. CONTRAST:  61m GADAVIST GADOBUTROL 1 MMOL/ML IV SOLN COMPARISON:  None. FINDINGS: Brain: There is no acute infarction or intracranial hemorrhage. There is no intracranial mass, mass effect, or edema. There is no hydrocephalus or extra-axial fluid collection. Prominence of the ventricles and sulci reflects mild parenchymal volume loss. Patchy foci of T2 hyperintensity in the supratentorial white matter are nonspecific but may reflect minor chronic microvascular ischemic changes. Incidental note is made of a small left cerebellar developmental venous anomaly. Vascular: Major vessel flow voids at the skull base are preserved. Skull and upper cervical spine: Normal marrow signal is preserved. Sinuses/Orbits: Paranasal sinuses are aerated. Orbits are unremarkable. Other: Sella is unremarkable.  Mastoid air cells are clear. IMPRESSION: No evidence of recent infarction, hemorrhage, or  mass. No abnormal enhancement. Minor chronic microvascular ischemic changes. Electronically Signed   By: Macy Mis M.D.   On: 11/26/2021 11:23       Assessment & Plan:   Problem List Items Addressed This Visit     Anemia    Check cbc.       Relevant Orders   CBC with  Differential/Platelet (Completed)   IBC + Ferritin (Completed)   Benign prostatic hyperplasia without urinary obstruction    Has been followed by urology.  On tamsulosin and finasteride.  No increased urinary symptoms reported.  Has been on these medications for a while.  Per note, not orthostatic on exam.  Hold on changing medication.       Essential hypertension    Continue losartan and amlodipine.  Pressures as outlined.  Follow pressures.  Follow metabolic panel.       Relevant Orders   Basic metabolic panel (Completed)   Fatigue - Primary   Relevant Orders   EKG 12-Lead (Completed)   Multiple Myeloma Panel (SPEP&IFE w/QIG)   Vitamin B1   Vitamin B6   Folate (Completed)   Vitamin D (25 hydroxy) (Completed)   Hand tingling    Found to have carpal tunnel.  Planning for surgery next week.  Discussed the need to postpone until can sort through current issues - cardiology evaluation as outlined.       Hyperlipidemia    On simvastatin.  Low cholesterol diet and exercise.  Follow lipid panel and liver function tests.        Relevant Orders   Hepatic function panel (Completed)   Light headedness    Persistent.  W/up as outlined.  MRI as outlined.  Planning for vestibular rehab.        Near syncope    Has the light headedness as outlined.  W/up as outlined.  He is also having intermittent episodes when standing - where he feels if he does not sit down, he would pass out.  Neurology w/up as outlined.  Saw Dr Manuella Ghazi.  Recommended cardiology evaluation to confirm no arrhythmia, etc.  Discussed.  He is agreeable for referral.  EKG - SR with PVCs, RBBB.   No acute ischemic changes.  Discussed postponing carpal tunnel surgery until cardiology evaluation complete.  Pt request to see Dr Nehemiah Massed.       Neck pain    Previous MRI c-spine - spinal stenosis and foraminal stenosis.  Saw [facility].  Could be contributing to current symptoms.  Follow.       OSA on CPAP    Diagnosed with severe sleep  apnea.  Compliant with cpap.  Follow.       Other Visit Diagnoses     Pre-op evaluation       Relevant Orders   EKG 12-Lead (Completed)   Hemoglobin A1c (Completed)   Dizziness       Relevant Orders   Multiple Myeloma Panel (SPEP&IFE w/QIG)   Vitamin B1   Vitamin B6   Folate (Completed)   IBC + Ferritin (Completed)   Hemoglobin A1c (Completed)   Ambulatory referral to Cardiology        [name], MD

## 2022-02-03 LAB — BASIC METABOLIC PANEL
BUN: 18 mg/dL (ref 6–23)
CO2: 24 mEq/L (ref 19–32)
Calcium: 9.6 mg/dL (ref 8.4–10.5)
Chloride: 105 mEq/L (ref 96–112)
Creatinine, Ser: 0.84 mg/dL (ref 0.40–1.50)
GFR: 80.43 mL/min (ref >60.00)
Glucose, Bld: 97 mg/dL (ref 70–99)
Potassium: 3.9 mEq/L (ref 3.5–5.1)
Sodium: 141 mEq/L (ref 135–145)

## 2022-02-03 LAB — IBC + FERRITIN
Ferritin: 19.9 ng/mL — ABNORMAL LOW (ref 22.0–322.0)
Iron: 101 ug/dL (ref 42–165)
Saturation Ratios: 22.9 % (ref 20.0–50.0)
TIBC: 441 ug/dL (ref 250.0–450.0)
Transferrin: 315 mg/dL (ref 212.0–360.0)

## 2022-02-03 LAB — HEPATIC FUNCTION PANEL
ALT: 15 U/L (ref 0–53)
AST: 18 U/L (ref 0–37)
Albumin: 4.7 g/dL (ref 3.5–5.2)
Alkaline Phosphatase: 74 U/L (ref 39–117)
Bilirubin, Direct: 0.1 mg/dL (ref 0.0–0.3)
Total Bilirubin: 0.7 mg/dL (ref 0.2–1.2)
Total Protein: 7.5 g/dL (ref 6.0–8.3)

## 2022-02-03 NOTE — Telephone Encounter (Signed)
Patient is aware and will stop vitamin D

## 2022-02-04 ENCOUNTER — Encounter: Payer: Self-pay | Admitting: Internal Medicine

## 2022-02-04 DIAGNOSIS — R55 Syncope and collapse: Secondary | ICD-10-CM | POA: Insufficient documentation

## 2022-02-04 NOTE — Assessment & Plan Note (Signed)
Previous MRI c-spine - spinal stenosis and foraminal stenosis.  Saw [facility].  Could be contributing to current symptoms.  Follow.

## 2022-02-04 NOTE — Assessment & Plan Note (Signed)
Has been followed by urology.  On tamsulosin and finasteride.  No increased urinary symptoms reported.  Has been on these medications for a while.  Per note, not orthostatic on exam.  Hold on changing medication.

## 2022-02-04 NOTE — Assessment & Plan Note (Signed)
On simvastatin.  Low cholesterol diet and exercise.  Follow lipid panel and liver function tests.   

## 2022-02-04 NOTE — Assessment & Plan Note (Signed)
Check cbc 

## 2022-02-04 NOTE — Assessment & Plan Note (Signed)
Continue losartan and amlodipine.  Pressures as outlined.  Follow pressures.  Follow metabolic panel.

## 2022-02-04 NOTE — Assessment & Plan Note (Signed)
Diagnosed with severe sleep apnea.  Compliant with cpap.  Follow.

## 2022-02-04 NOTE — Assessment & Plan Note (Addendum)
Has the light headedness as outlined.  W/up as outlined.  He is also having intermittent episodes when standing - where he feels if he does not sit down, he would pass out.  Neurology w/up as outlined.  Saw Dr Manuella Ghazi.  Recommended cardiology evaluation to confirm no arrhythmia, etc.  Discussed.  He is agreeable for referral.  EKG - SR with PVCs, RBBB.   No acute ischemic changes.  Discussed postponing carpal tunnel surgery until cardiology evaluation complete.  Pt request to see Dr Nehemiah Massed.

## 2022-02-04 NOTE — Assessment & Plan Note (Signed)
Persistent.  W/up as outlined.  MRI as outlined.  Planning for vestibular rehab.

## 2022-02-04 NOTE — Assessment & Plan Note (Signed)
Found to have carpal tunnel.  Planning for surgery next week.  Discussed the need to postpone until can sort through current issues - cardiology evaluation as outlined.

## 2022-02-06 LAB — MULTIPLE MYELOMA PANEL, SERUM
Albumin SerPl Elph-Mcnc: 4 g/dL (ref 2.9–4.4)
Albumin/Glob SerPl: 1.5 (ref 0.7–1.7)
Alpha 1: 0.2 g/dL (ref 0.0–0.4)
Alpha2 Glob SerPl Elph-Mcnc: 0.8 g/dL (ref 0.4–1.0)
B-Globulin SerPl Elph-Mcnc: 1 g/dL (ref 0.7–1.3)
Gamma Glob SerPl Elph-Mcnc: 0.8 g/dL (ref 0.4–1.8)
Globulin, Total: 2.8 g/dL (ref 2.2–3.9)
IgA/Immunoglobulin A, Serum: 51 mg/dL — ABNORMAL LOW (ref 61–437)
IgG (Immunoglobin G), Serum: 745 mg/dL (ref 603–1613)
IgM (Immunoglobulin M), Srm: 55 mg/dL (ref 15–143)
Total Protein: 6.8 g/dL (ref 6.0–8.5)

## 2022-02-08 ENCOUNTER — Telehealth: Payer: Self-pay | Admitting: Internal Medicine

## 2022-02-08 DIAGNOSIS — R42 Dizziness and giddiness: Secondary | ICD-10-CM

## 2022-02-08 NOTE — Telephone Encounter (Signed)
We sent his stuff to Dr Nehemiah Massed. Ok to send to Dr Rockey Situ?

## 2022-02-08 NOTE — Telephone Encounter (Signed)
Pt spouse called in stating pt would like to see Ida Rogue instead of Nehemiah Massed for the referral

## 2022-02-08 NOTE — Telephone Encounter (Signed)
Yes.  He had requested Dr Nehemiah Massed at his appt.  Ok to send to Dr Rockey Situ.  Please confirm he is doing ok.

## 2022-02-09 LAB — VITAMIN B6: Vitamin B6: 12.6 ng/mL (ref 2.1–21.7)

## 2022-02-09 LAB — VITAMIN B1: Vitamin B1 (Thiamine): 13 nmol/L (ref 8–30)

## 2022-02-09 NOTE — Telephone Encounter (Signed)
New referral placed to Dr Rockey Situ

## 2022-02-15 DIAGNOSIS — D2272 Melanocytic nevi of left lower limb, including hip: Secondary | ICD-10-CM | POA: Diagnosis not present

## 2022-02-15 DIAGNOSIS — L570 Actinic keratosis: Secondary | ICD-10-CM | POA: Diagnosis not present

## 2022-02-15 DIAGNOSIS — D2262 Melanocytic nevi of left upper limb, including shoulder: Secondary | ICD-10-CM | POA: Diagnosis not present

## 2022-02-15 DIAGNOSIS — D2271 Melanocytic nevi of right lower limb, including hip: Secondary | ICD-10-CM | POA: Diagnosis not present

## 2022-02-15 DIAGNOSIS — D225 Melanocytic nevi of trunk: Secondary | ICD-10-CM | POA: Diagnosis not present

## 2022-02-15 DIAGNOSIS — L821 Other seborrheic keratosis: Secondary | ICD-10-CM | POA: Diagnosis not present

## 2022-02-15 DIAGNOSIS — D2261 Melanocytic nevi of right upper limb, including shoulder: Secondary | ICD-10-CM | POA: Diagnosis not present

## 2022-03-01 ENCOUNTER — Other Ambulatory Visit: Payer: Self-pay | Admitting: Internal Medicine

## 2022-03-10 ENCOUNTER — Ambulatory Visit: Payer: PPO | Attending: Neurology

## 2022-03-10 ENCOUNTER — Other Ambulatory Visit: Payer: Self-pay

## 2022-03-10 DIAGNOSIS — R2689 Other abnormalities of gait and mobility: Secondary | ICD-10-CM | POA: Insufficient documentation

## 2022-03-10 DIAGNOSIS — R2681 Unsteadiness on feet: Secondary | ICD-10-CM | POA: Diagnosis not present

## 2022-03-10 DIAGNOSIS — R278 Other lack of coordination: Secondary | ICD-10-CM | POA: Insufficient documentation

## 2022-03-10 DIAGNOSIS — M6281 Muscle weakness (generalized): Secondary | ICD-10-CM | POA: Diagnosis not present

## 2022-03-10 DIAGNOSIS — R42 Dizziness and giddiness: Secondary | ICD-10-CM | POA: Diagnosis not present

## 2022-03-10 NOTE — Therapy (Signed)
[facility] ?[place], [place], [address] ?Phone: [phone]   Fax:  [phone] ? ?Physical Therapy Evaluation ? ?Patient Details  ?Name: Brian Pearson ?MRN: [id number] ?Date of Birth: 1936/01/30 ?Referring Provider (PT): Vladimir Crofts, MD ? ? ?Encounter Date: 03/10/2022 ? ? PT End of Session - 03/10/22 1512   ? ? Visit Number 1   ? Number of Visits 25   ? Date for PT Re-Evaluation 06/02/22   ? PT Start Time 7001   ? PT Stop Time 1530   ? PT Time Calculation (min) 59 min   ? Activity Tolerance Patient tolerated treatment well   ? Behavior During Therapy [facility] for tasks assessed/performed   ? ?  ?  ? ?  ? ? ?Past Medical History:  ?Diagnosis Date  ? Arthritis   ? BPH with obstruction/lower urinary tract symptoms   ? GERD (gastroesophageal reflux disease)   ? Hyperlipidemia   ? taken off simvastatin last year  ? Hypertension   ? Obesity   ? Pneumonia   ? hospitalized   ? Sleep apnea   ? uses cpap machine   ? Urinary frequency   ? Urine incontinence   ? ? ?Past Surgical History:  ?Procedure Laterality Date  ? COLONOSCOPY WITH PROPOFOL N/A 02/09/2016  ? Procedure: COLONOSCOPY WITH PROPOFOL;  Surgeon: Hulen Luster, MD;  Location: [facility] ENDOSCOPY;  Service: Gastroenterology;  Laterality: N/A;  ? ESOPHAGOGASTRODUODENOSCOPY (EGD) WITH PROPOFOL N/A 02/09/2016  ? Procedure: ESOPHAGOGASTRODUODENOSCOPY (EGD) WITH PROPOFOL;  Surgeon: Hulen Luster, MD;  Location: [facility] ENDOSCOPY;  Service: Gastroenterology;  Laterality: N/A;  ? HERNIA REPAIR    ? 1990's - 3 surgeries  ? KNEE ARTHROPLASTY Left 04/01/2021  ? Procedure: COMPUTER ASSISTED TOTAL KNEE ARTHROPLASTY;  Surgeon: Dereck Leep, MD;  Location: [facility] ORS;  Service: Orthopedics;  Laterality: Left;  ? ? ?There were no vitals filed for this visit. ? ? ? Subjective Assessment - 03/10/22 1432   ? ? Subjective Pt is a pleasant 86 y/o male presenting to PT due to chronic wooziness (onset June 2022). Pt describes his  symptoms as sensation of ?sloshing? in his head, unsteadiness, and lightheadedness in addition to wooziness. He denies spinning but does sometimes feel as if he is moving. He reports good and bad symptom days. His symptoms are worse when first waking up in the morning, affecting his balance. He holds onto things to steady himself. Pt denies headaches. Pt does report occasional ringing in his ears, denies aural fullness. Pt wears hearing aids.  He has numbness and tingling in his hands. Pt has no other hx of similar symptoms. Pt enjoys being active but feels limited now due to his symptoms. Pt with hx of L knee surgery approximately one year ago. He has first appointment with cardiologist on April 5th to rule out possible cardiovascular component. Pt has not seen an ENT.   ? Pertinent History Pt is a pleasant 86 y/o male presenting to PT due to chronic wooziness (onset June 2022). Pt describes his symptoms as sensation of ?sloshing? in his head, unsteadiness, and lightheadedness in addition to wooziness. He denies spinning but does sometimes feel as if he is moving. He reports good and bad symptom days. His symptoms are worse when first waking up in the morning, affecting his balance. He holds onto things to steady himself. Pt denies headaches. Pt does report occasional ringing in his ears, denies aural fullness. Pt wears hearing  aids.  He has numbness and tingling in his hands. Pt has no other hx of similar symptoms. Pt enjoys being active but feels limited now due to his symptoms. Pt with hx of L knee surgery approximately one year ago. He has first appointment with cardiologist on April 5th to rule out possible cardiovascular component. Pt has not seen an ENT.Other PMH significant for: HLD, HTN, dysphagia, OSA on CPAP, neck pain, L and R knee pain, memory change, paresthesia of both hands, hearing loss, total knee replacement, fatigue, near syncope, B carpal tunnel, severe cervical degenerative joint disease,  spinal stenosis C4-5   ? Limitations Walking;House hold activities;Lifting;Standing   ? How long can you sit comfortably? not limited   ? How long can you stand comfortably? 15-20 minutes reports "I don't have the energy I used to"   ? How long can you walk comfortably? 1 mile, limited due to decreased energy   ? Diagnostic tests Per chart: " MR BRAIN W WO CONTRAST 11/26/2021 IMPRESSION:  No evidence of recent infarction, hemorrhage, or mass. No abnormal  enhancement.     Minor chronic microvascular ischemic changes.  "   ? Patient Stated Goals Pt would like to decrease his wooziness   ? Currently in Pain? Yes   ? Pain Location Neck   ? Pain Orientation Right;Left   ? Pain Type Chronic pain   ? ?  ?  ? ?  ? ? ?VESTIBULAR AND BALANCE EVALUATION ? ? ?HISTORY:  ?History of current problem: Onset of wooziness June 2022. See subjective section for further details. ? ?Description of dizziness: (vertigo, unsteadiness, lightheadedness, falling, general unsteadiness, whoozy, swimmy-headed sensation, aural fullness): Pt reports he experiences unsteadiness, lightheadedness, but denies spinning sensation. Pt does, however, report feeling as if he is "moving," reports wooziness.  ? ?Frequency:  ?Symptoms are worst in the morning. Pt can have underlying sensation of lightheadedness/wooziness throughout the day. ? ?Duration: "any time I'm up and moving around it bothers me." ? ?Symptom nature: (motion provoked, positional, spontaneous, constant, variable, intermittent) - motion provoked, positional. Pt denies spontaneous symptoms.  ? ?Provocative Factors: bending, looking up ? ?Easing Factors: Sitting in a chair and being still  ? ?History of similar episodes: no hx of similar episodes  ? ?Falls (yes/no): none ?Number of falls in past 6 months: n/a ? ?Prior Functional Level: Pt reports "I don't want to do a whole lot because I feel so bad." Pt feels his general activity level has been limited since onset of symptoms.  ? ?Auditory  complaints (tinnitus, pain, drainage, hearing loss, aural fullness): ?Vision (diplopia, visual field loss, recent changes, last eye exam):  ?Pt does endorse occasional ringing in his ears. Pt wears hearing aids (prior to onset of symptoms), no vision changes ? ?Red Flags: (dysarthria, dysphagia, drop attacks, bowel and bladder changes, recent weight loss/gain) Review of systems negative for red flags.  ?Pt reports he lost 20 lbs around the time of his knee surgery, but his weight has stabilized since then.  ? ? ? ?EXAMINATION ? ?POSTURE: slight thoracic kyphosis ? ? ?SOMATOSENSORY:  ? ?      Sensation           Intact      Diminished         Absent  ?Light touch x    ?  ?COORDINATION: ?Finger to Nose: Normal ?Rapid Alternating Movements: Normal ? ? ?MUSCULOSKELETAL SCREEN: ?Cervical Spine ROM: Formal assessment deferred. However, pt does report feeling of stiffness and  pain in his neck when turning his head B. Stiffness of cervical spine also limits head impulse testing on this date, indicating hypomobility with cervical rotation B. ?  ? ?Gait: ?Scanning of visual environment with gait is impaired due to symptoms with head turns, looking up and looking down. Further gait assessment to be completed future session.  ? ? ? ?OCULOMOTOR / VESTIBULAR TESTING: ? ?Oculomotor Exam- Room Light ? Findings Comments  ?Ocular Alignment normal   ?Ocular ROM normal   ?Spontaneous Nystagmus normal   ?Gaze-Holding Nystagmus normal   ?End-Gaze Nystagmus normal   ?Vergence (normal 2-3") normal   ?Smooth Pursuit abnormal saccadic  ?Saccades normal   ?VOR Cancellation abnormal Possible corrective saccades, pt reports feeling woozy   ?Left Head Impulse See note Pt significant stiffness of cervical spine when attempt test, difficult to achieve necessary speed for testing. Pt does report feeling woozy with testing.  ?Right Head Impulse See note Pt significant stiffness of cervical spine when attempt test, difficult to achieve necessary  speed for testing. Pt does report feeling woozy with testing.  ? ? ?BPPV TESTS: ? Symptoms Duration Intensity Nystagmus  ?L Dix-Hallpike woozy Approx 1 min, possibly greater 3/10 Possible few beats L beating? dela

## 2022-03-15 ENCOUNTER — Other Ambulatory Visit: Payer: Self-pay

## 2022-03-15 ENCOUNTER — Ambulatory Visit: Payer: PPO

## 2022-03-15 DIAGNOSIS — M6281 Muscle weakness (generalized): Secondary | ICD-10-CM

## 2022-03-15 DIAGNOSIS — R278 Other lack of coordination: Secondary | ICD-10-CM

## 2022-03-15 DIAGNOSIS — R2681 Unsteadiness on feet: Secondary | ICD-10-CM

## 2022-03-15 DIAGNOSIS — R42 Dizziness and giddiness: Secondary | ICD-10-CM

## 2022-03-15 NOTE — Therapy (Signed)
[facility] ?[place], [place], [address] ?Phone: [phone]   Fax:  [phone] ? ?Physical Therapy Treatment ? ?Patient Details  ?Name: Brian Pearson ?MRN: [id number] ?Date of Birth: 06/11/1936 ?Referring Provider (PT): Vladimir Crofts, MD ? ? ?Encounter Date: 03/15/2022 ? ? PT End of Session - 03/15/22 1652   ? ? Visit Number 2   ? Number of Visits 25   ? Date for PT Re-Evaluation 06/02/22   ? PT Start Time 1603   ? PT Stop Time 7035   ? PT Time Calculation (min) 43 min   ? Equipment Utilized During Treatment Gait belt   ? Activity Tolerance Patient tolerated treatment well   ? Behavior During Therapy [facility] for tasks assessed/performed   ? ?  ?  ? ?  ? ? ?Past Medical History:  ?Diagnosis Date  ? Arthritis   ? BPH with obstruction/lower urinary tract symptoms   ? GERD (gastroesophageal reflux disease)   ? Hyperlipidemia   ? taken off simvastatin last year  ? Hypertension   ? Obesity   ? Pneumonia   ? hospitalized   ? Sleep apnea   ? uses cpap machine   ? Urinary frequency   ? Urine incontinence   ? ? ?Past Surgical History:  ?Procedure Laterality Date  ? COLONOSCOPY WITH PROPOFOL N/A 02/09/2016  ? Procedure: COLONOSCOPY WITH PROPOFOL;  Surgeon: Hulen Luster, MD;  Location: [facility];  Service: Gastroenterology;  Laterality: N/A;  ? ESOPHAGOGASTRODUODENOSCOPY (EGD) WITH PROPOFOL N/A 02/09/2016  ? Procedure: ESOPHAGOGASTRODUODENOSCOPY (EGD) WITH PROPOFOL;  Surgeon: Hulen Luster, MD;  Location: [facility] ENDOSCOPY;  Service: Gastroenterology;  Laterality: N/A;  ? HERNIA REPAIR    ? 1990's - 3 surgeries  ? KNEE ARTHROPLASTY Left 04/01/2021  ? Procedure: COMPUTER ASSISTED TOTAL KNEE ARTHROPLASTY;  Surgeon: Dereck Leep, MD;  Location: [facility] ORS;  Service: Orthopedics;  Laterality: Left;  ? ? ?There were no vitals filed for this visit. ? ? Subjective Assessment - 03/15/22 1601   ? ? Subjective Pt reports feeling somewhat better following last appointment. He  reports he is no longer having difficulty/wooziness when first getting out of his bed. He reports current wooziness is 2/10.   ? Pertinent History Pt is a pleasant 86 y/o male presenting to PT due to chronic wooziness (onset June 2022). Pt describes his symptoms as sensation of ?sloshing? in his head, unsteadiness, and lightheadedness in addition to wooziness. He denies spinning but does sometimes feel as if he is moving. He reports good and bad symptom days. His symptoms are worse when first waking up in the morning, affecting his balance. He holds onto things to steady himself. Pt denies headaches. Pt does report occasional ringing in his ears, denies aural fullness. Pt wears hearing aids.  He has numbness and tingling in his hands. Pt has no other hx of similar symptoms. Pt enjoys being active but feels limited now due to his symptoms. Pt with hx of L knee surgery approximately one year ago. He has first appointment with cardiologist on April 5th to rule out possible cardiovascular component. Pt has not seen an ENT.Other PMH significant for: HLD, HTN, dysphagia, OSA on CPAP, neck pain, L and R knee pain, memory change, paresthesia of both hands, hearing loss, total knee replacement, fatigue, near syncope, B carpal tunnel, severe cervical degenerative joint disease, spinal stenosis C4-5   ? Limitations Walking;House hold activities;Lifting;Standing   ? How long can you  sit comfortably? not limited   ? How long can you stand comfortably? 15-20 minutes reports "I don't have the energy I used to"   ? How long can you walk comfortably? 1 mile, limited due to decreased energy   ? Diagnostic tests Per chart: " MR BRAIN W WO CONTRAST 11/26/2021 IMPRESSION:  No evidence of recent infarction, hemorrhage, or mass. No abnormal  enhancement.     Minor chronic microvascular ischemic changes.  "   ? Patient Stated Goals Pt would like to decrease his wooziness   ? Currently in Pain? No/denies   ? ?  ?  ? ?   ?INTERVENTIONS ? ? ?ROM cervical spine- ?Lateral flexion approx 25 deg. B, reports "very little pain" with motion ?Extension 32 deg. Slightly painful ?Rotation L 27 deg., R 30 deg and painful ? ?Standing VORx1 30 sec, horizontal head turns.  ?Seated VORx1 2x30 sec with horizontal head turns. Pt reports image looks as if it is moving. Pt reports wooziness increases to 3-4/10. ?Seated VORx1 2x30 sec with vertical head turns. Reports not as challenging as horizontal. ? ?M-CTSIB: able to perform all except condition 4, indicating decreased use of vestibular cues. Does exhibit increased sway condition 2 and 3.  ? ?NBOS EC on firm surface 2x30 sec. Increased sway. ? ?Lat. Cervical flexion stretch 60 sec each side ? ?DHI - 28 ? ?DGI - 20/24 ? ?DVA - change of 2 lines. However, pt with significant difficulty sustaining cervical rotation due to cervical hypomobility/stiffness, possibly affecting score. Pt did report dizziness with test and feeling like it was difficult to keep his eyes focused.  ? ?MMT  - 4+/5 BLEs ? ? ?Instructed pt in HEP: ? ? ?Access Code: [email or web address] ?URL: [email or web address] ?Date: 03/15/2022 ?Prepared by: Ricard Dillon ? ?Exercises ?- Seated Gaze Stabilization with Head Rotation  - 1 x daily - 7 x weekly - 3 sets - 1 reps - 60 hold ? ?Pt educated throughout session about proper posture and technique with exercises. Improved exercise technique, movement at target joints, use of target muscles after min to mod verbal, visual, tactile cues. ? ? ? ? PT Short Term Goals - 03/10/22 1501   ? ?  ? PT SHORT TERM GOAL #1  ? Title Pt will be independent with HEP to improve balance and functional mobility in order to decrease fall risk and improve function at home and work.   ? Baseline 3/22: to be issued next 1-2 visits   ? Time 6   ? Period Weeks   ? Status New   ? Target Date 04/21/22   ? ?  ?  ? ?  ? ? ? ? PT Long Term Goals - 03/15/22 1705   ? ?  ? PT LONG TERM GOAL #1  ? Title Patient will  increase FOTO score to equal to or greater than 58 to demonstrate statistically significant improvement in mobility and quality of life.   ? Baseline 3/22: 53   ? Time 12   ? Period Weeks   ? Status New   ? Target Date 06/02/22   ?  ? PT LONG TERM GOAL #2  ? Title Pt will decrease DHI score by at least 18 points in order to demonstrate clinically significant reduction in disability   ? Baseline 3/27: 28   ? Time 12   ? Period Weeks   ? Status New   ? Target Date 06/02/22   ?  ? PT LONG TERM GOAL #3  ?  Title Pt will improve DGI by at least 3 points in order to demonstrate clinically significant improvement in balance and decreased risk for falls.   ? Baseline 3/27: 20   ? Time 12   ? Period Weeks   ? Status New   ? Target Date 06/03/22   ?  ? PT LONG TERM GOAL #4  ? Title Pt will report a decrease in wooziness symptoms with daily activities by at least 50% in order to increase QOL and decrease fall risk.   ? Baseline 3/22: Pt currently limited due to wooziness/dizziness, most triggered by bending, looking up and turning his head.   ? Time 12   ? Period Weeks   ? Status New   ? Target Date 06/02/22   ? ?  ?  ? ?  ? ? ? ? ? ? ? ? Plan - 03/15/22 1657   ? ? Clinical Impression Statement Pt with improvement reporting no symptoms with first getting out of his bed following last appointment. Further assessment completed on this date. Pt DHI score is 28 indicating low perception of handicap. Pt DGI score is 20/24, where pt had most difficulty with head turns and stairs. Formal ROM measurements were taken of cervical spine, with pt exhibiting gross impairment and pain. Cervical stiffness did affect pt's ability to perform DVA testing. However, pt still with reports of dizziness and difficulty with gaze stability with test. PT instructed pt in VORx1 (seated) for HEP. The pt will continue to benefit from further skilled PT to address these deficits in order to improve dizziness and balance and to increase QOL and decrease fall  risk.   ? Personal Factors and Comorbidities Age;Comorbidity 3+;Time since onset of injury/illness/exacerbation   ? Comorbidities Other PMH significant for: HLD, HTN, dysphagia, OSA on CPAP, neck pain, L and R knee pain, memor

## 2022-03-19 DIAGNOSIS — G4733 Obstructive sleep apnea (adult) (pediatric): Secondary | ICD-10-CM | POA: Diagnosis not present

## 2022-03-23 ENCOUNTER — Ambulatory Visit: Payer: PPO

## 2022-03-24 ENCOUNTER — Ambulatory Visit: Payer: PPO | Admitting: Cardiovascular Disease

## 2022-03-24 ENCOUNTER — Encounter: Payer: Self-pay | Admitting: Cardiovascular Disease

## 2022-03-24 VITALS — BP 142/63 | HR 84 | Ht 73.0 in | Wt 224.1 lb

## 2022-03-24 DIAGNOSIS — R9431 Abnormal electrocardiogram [ECG] [EKG]: Secondary | ICD-10-CM | POA: Diagnosis not present

## 2022-03-24 DIAGNOSIS — R42 Dizziness and giddiness: Secondary | ICD-10-CM | POA: Diagnosis not present

## 2022-03-24 DIAGNOSIS — R200 Anesthesia of skin: Secondary | ICD-10-CM | POA: Diagnosis not present

## 2022-03-24 DIAGNOSIS — Z01810 Encounter for preprocedural cardiovascular examination: Secondary | ICD-10-CM

## 2022-03-24 NOTE — Progress Notes (Signed)
Cardiology Office Note ? ?Date:  03/24/2022  ? ?ID:  Brian Pearson, DOB 04-25-1936, MRN [id number] ? ?PCP:  [name], MD  ? ?Chief Complaint  ?Patient presents with  ? New Patient (Initial Visit)  ?  Ref by [name], MD for RBBB & dizziness. The patient was scheduled to have carpal tunnel release and was told to cancel due to abnormal EKG. Patient c/o dizziness, weakness and fatigue. Medications reviewed by the patient verbally.   ? ? ?HPI:  ?Mr Brian Pearson is a 86 year old gentleman with past medical history of ?Chronic dizzinesw ?RBBB ?Referred by [name] for dizziness/vertigo ? ?Notes from Dr. Nicki Reaper indicating ?persistent intermittent episodes of light headedness/dizziness.  ?episodes when standing, feeling like he is going to pass out.   ?Has been seen by neurology ? ?On today's visit he reports that he is doing relatively well, ?Started working with PT for his dizziness ?Reports that if he moves his head too quickly he will get little bit dizzy ?Not particularly dizzy when standing up only when he moves his head ? ?Hands numb all the time ?Reports that he was scheduled for carpal tunnel surgery ?Imaging reviewed ?MRI neck 10/20 ?1. Severe spinal canal stenosis and right neural foraminal stenosis ?at C4-5. ?2. Severe left C2-3 neural foraminal stenosis. ?3. Moderate spinal canal stenosis at C6-7. ? ?Gets tired, No energy times ?Tries to exercise ?Bikes 20 min, squats ? ?No falls ? ?MRI head for dizziness reviewed ?No evidence of recent infarction, hemorrhage, or mass. No abnormal ?enhancement. ?Minor chronic microvascular ischemic changes. ? ?EKG personally reviewed by myself on todays visit ?NSR with right bundle branch block dating back to 2015, unchanged ? ?PMH:   has a past medical history of Arthritis, BPH with obstruction/lower urinary tract symptoms, GERD (gastroesophageal reflux disease), Hyperlipidemia, Hypertension, Obesity, Pneumonia, Sleep apnea, Urinary frequency, and Urine  incontinence. ? ?PSH:    ?Past Surgical History:  ?Procedure Laterality Date  ? COLONOSCOPY WITH PROPOFOL N/A 02/09/2016  ? Procedure: COLONOSCOPY WITH PROPOFOL;  Surgeon: Hulen Luster, MD;  [facility] ENDOSCOPY;  Service: Gastroenterology;  Laterality: N/A;  ? ESOPHAGOGASTRODUODENOSCOPY (EGD) WITH PROPOFOL N/A 02/09/2016  ? Procedure: ESOPHAGOGASTRODUODENOSCOPY (EGD) WITH PROPOFOL;  Surgeon: Hulen Luster, MD;  Location: [facility];  Service: Gastroenterology;  Laterality: N/A;  ? HERNIA REPAIR    ? 1990's - 3 surgeries  ? KNEE ARTHROPLASTY Left 04/01/2021  ? Procedure: COMPUTER ASSISTED TOTAL KNEE ARTHROPLASTY;  Surgeon: Dereck Leep, MD;  Location: [facility] ORS;  Service: Orthopedics;  Laterality: Left;  ? ? ?Current Outpatient Medications  ?Medication Sig Dispense Refill  ? amLODipine (NORVASC) 10 MG tablet Take 1 tablet by mouth once daily 90 tablet 1  ? Coenzyme Q10 (COQ10 PO) Take 1 capsule by mouth daily.    ? finasteride (PROSCAR) 5 MG tablet Take 1 tablet by mouth once daily 90 tablet 3  ? losartan (COZAAR) 50 MG tablet Take 1 tablet by mouth once daily 30 tablet 0  ? Multiple Vitamin (MULTI-VITAMINS) TABS Take 1 tablet by mouth daily.    ? Omega-3 Fatty Acids (FISH OIL PO) Take 1 capsule by mouth daily.    ? omeprazole (PRILOSEC) 20 MG capsule Take 1 capsule by mouth once daily 90 capsule 0  ? simvastatin (ZOCOR) 20 MG tablet TAKE 1 TABLET BY MOUTH AT BEDTIME -  NEED  APPT 90 tablet 0  ? tamsulosin (FLOMAX) 0.4 MG CAPS capsule Take 1 capsule by mouth once daily 90 capsule 3  ?  Cholecalciferol (VITAMIN D-3) 1000 units CAPS Take 1,000 Units by mouth daily. (Patient not taking: Reported on 03/24/2022)    ? Cyanocobalamin 5000 MCG SUBL Place 5,000 mcg under the tongue daily. (Patient not taking: Reported on 03/24/2022)    ? ?No current facility-administered medications for this visit.  ? ? ? ?Allergies:   Patient has no known allergies.  ? ?Social History:  The patient  reports that he has never smoked. He has never  used smokeless tobacco. He reports current alcohol use. He reports that he does not use drugs.  ? ?Family History:   family history includes Alzheimer's disease in his father; Arthritis in his mother; Cancer in his father; Dementia in his father; Heart disease in his mother; Hypotension in his sister.  ? ? ?Review of Systems: ?Review of Systems  ?Constitutional: Negative.   ?HENT: Negative.    ?Respiratory: Negative.    ?Cardiovascular: Negative.   ?Gastrointestinal: Negative.   ?Musculoskeletal: Negative.   ?Neurological:  Positive for dizziness.  ?Psychiatric/Behavioral: Negative.    ?All other systems reviewed and are negative. ? ? ?PHYSICAL EXAM: ?VS:  BP (!) 142/63 (BP Location: Left Arm, Patient Position: Sitting, Cuff Size: Normal)   Pulse 84   Ht '6\' 1"'$  (1.854 m)   Wt 101.7 kg   SpO2 98%   BMI 29.57 kg/m?  , BMI Body mass index is 29.57 kg/m?. ?GEN: Well nourished, well developed, in no acute distress ?HEENT: normal ?Neck: no JVD, carotid bruits, or masses ?Cardiac: RRR; no murmurs, rubs, or gallops,no edema  ?Respiratory:  clear to auscultation bilaterally, normal work of breathing ?GI: soft, nontender, nondistended, + BS ?MS: no deformity or atrophy ?Skin: warm and dry, no rash ?Neuro:  Strength and sensation are intact ?Psych: euthymic mood, full affect ? ?Recent Labs: ?06/29/2021: TSH 2.96 ?02/02/2022: ALT 15; BUN 18; Creatinine, Ser 0.84; Hemoglobin 13.5; Platelets 204.0; Potassium 3.9; Sodium 141  ? ? ?Lipid Panel ?Lab Results  ?Component Value Date  ? CHOL 159 11/06/2021  ? HDL 60.10 11/06/2021  ? Galt 77 11/06/2021  ? TRIG 106.0 11/06/2021  ? ?  ? ?Wt Readings from Last 3 Encounters:  ?03/24/22 101.7 kg  ?02/02/22 100.7 kg  ?11/24/21 99.8 kg  ?  ? ?ASSESSMENT AND PLAN: ? ?Problem List Items Addressed This Visit   ?None ?Visit Diagnoses   ? ? Preop cardiovascular exam    -  Primary  ? Relevant Orders  ? EKG 12-Lead  ? Abnormal EKG      ? Dizziness      ? Hand numbness      ? ?  ? ?Preoperative  vascular examination ?Reports that he was scheduled for carpal tunnel surgery, delay on the procedure secondary to abnormal EKG ?EKG reviewed, unchanged from 2015, benign finding of right bundle branch block ?Asymptomatic, no anginal symptoms, able to do exercise on his bike daily with no anginal symptoms ?No near-syncope or syncope ?Acceptable risk for surgery, no further cardiac testing needed ? ?Chronic dizziness ?For started 1 year ago ?Now working with vestibular therapy ?Symptoms worse if he moves his head quickly consistent with vestibular etiology ?Orthostatics negative on today's visit ?No significant change in blood pressure 470 systolic up to 962E ? ?Hand numbness ?Reports that he was scheduled for carpal tunnel surgery ?Of Concern is the cervical spinal cord stenosis and foraminal narrowing noted on MRI 2020.  Reports both hands are numb, longstanding issue ? ? ? Total encounter time more than 60 minutes ? Greater than 50%  was spent in counseling and coordination of care with the patient ? ? ? ?Signed, ?Esmond Plants, M.D., Ph.D. ?[facility] [place], [place] ?[phone] ?

## 2022-03-24 NOTE — Patient Instructions (Addendum)
Medication Instructions:  No changes  If you need a refill on your cardiac medications before your next appointment, please call your pharmacy.   Lab work: No new labs needed  Testing/Procedures: No new testing needed  Follow-Up: At [facility], you and your health needs are our priority.  As part of our continuing mission to provide you with exceptional heart care, we have created designated Provider Care Teams.  These Care Teams include your primary Cardiologist (physician) and Advanced Practice Providers (APPs -  Physician Assistants and Nurse Practitioners) who all work together to provide you with the care you need, when you need it.  You will need a follow up appointment as needed  Providers on your designated Care Team:   Christopher Berge, NP Ryan Dunn, PA-C Cadence Furth, PA-C  COVID-19 Vaccine Information can be found at: [email or web address] For questions related to vaccine distribution or appointments, please email [email or web address] or call [phone]    

## 2022-03-25 ENCOUNTER — Ambulatory Visit: Payer: PPO | Attending: Neurology

## 2022-03-25 DIAGNOSIS — M6281 Muscle weakness (generalized): Secondary | ICD-10-CM | POA: Diagnosis not present

## 2022-03-25 DIAGNOSIS — R2689 Other abnormalities of gait and mobility: Secondary | ICD-10-CM | POA: Insufficient documentation

## 2022-03-25 DIAGNOSIS — R278 Other lack of coordination: Secondary | ICD-10-CM | POA: Diagnosis not present

## 2022-03-25 DIAGNOSIS — R262 Difficulty in walking, not elsewhere classified: Secondary | ICD-10-CM | POA: Insufficient documentation

## 2022-03-25 DIAGNOSIS — R2681 Unsteadiness on feet: Secondary | ICD-10-CM | POA: Insufficient documentation

## 2022-03-25 DIAGNOSIS — R42 Dizziness and giddiness: Secondary | ICD-10-CM | POA: Insufficient documentation

## 2022-03-25 NOTE — Therapy (Signed)
[facility] ?[place], [place], [address] ?Phone: [phone]   Fax:  [phone] ? ?Physical Therapy Treatment ? ?Patient Details  ?Name: Brian Pearson ?MRN: [id number] ?Date of Birth: March 13, 1936 ?Referring Provider (PT): Vladimir Crofts, MD ? ? ?Encounter Date: 03/25/2022 ? ? PT End of Session - 03/25/22 1937   ? ? Visit Number 3   ? Number of Visits 25   ? Date for PT Re-Evaluation 06/02/22   ? PT Start Time 1301   ? PT Stop Time 8546   ? PT Time Calculation (min) 44 min   ? Equipment Utilized During Treatment Gait belt   ? Activity Tolerance Patient tolerated treatment well   ? Behavior During Therapy [facility] for tasks assessed/performed   ? ?  ?  ? ?  ? ? ?Past Medical History:  ?Diagnosis Date  ? Arthritis   ? BPH with obstruction/lower urinary tract symptoms   ? GERD (gastroesophageal reflux disease)   ? Hyperlipidemia   ? taken off simvastatin last year  ? Hypertension   ? Obesity   ? Pneumonia   ? hospitalized   ? Sleep apnea   ? uses cpap machine   ? Urinary frequency   ? Urine incontinence   ? ? ?Past Surgical History:  ?Procedure Laterality Date  ? COLONOSCOPY WITH PROPOFOL N/A 02/09/2016  ? Procedure: COLONOSCOPY WITH PROPOFOL;  Surgeon: Hulen Luster, MD;  Location: [facility] ENDOSCOPY;  Service: Gastroenterology;  Laterality: N/A;  ? ESOPHAGOGASTRODUODENOSCOPY (EGD) WITH PROPOFOL N/A 02/09/2016  ? Procedure: ESOPHAGOGASTRODUODENOSCOPY (EGD) WITH PROPOFOL;  Surgeon: Hulen Luster, MD;  Location: [facility] ENDOSCOPY;  Service: Gastroenterology;  Laterality: N/A;  ? HERNIA REPAIR    ? 1990's - 3 surgeries  ? KNEE ARTHROPLASTY Left 04/01/2021  ? Procedure: COMPUTER ASSISTED TOTAL KNEE ARTHROPLASTY;  Surgeon: Dereck Leep, MD;  Location: [facility] ORS;  Service: Orthopedics;  Laterality: Left;  ? ? ?There were no vitals filed for this visit. ? ? Subjective Assessment - 03/25/22 1302   ? ? Subjective Pt reports 2 episodes of dizziness that occured while he was  standing and talking. He notices symptoms are worse when standing for a long time. Pt feels overall tha this symptoms have improved. He has been performing his HEP and thinks it is helping. Pt had appointment with cardiologist and reports appointment went well and that there were no concerning findings.   ? Pertinent History Pt is a pleasant 86 y/o male presenting to PT due to chronic wooziness (onset June 2022). Pt describes his symptoms as sensation of ?sloshing? in his head, unsteadiness, and lightheadedness in addition to wooziness. He denies spinning but does sometimes feel as if he is moving. He reports good and bad symptom days. His symptoms are worse when first waking up in the morning, affecting his balance. He holds onto things to steady himself. Pt denies headaches. Pt does report occasional ringing in his ears, denies aural fullness. Pt wears hearing aids.  He has numbness and tingling in his hands. Pt has no other hx of similar symptoms. Pt enjoys being active but feels limited now due to his symptoms. Pt with hx of L knee surgery approximately one year ago. He has first appointment with cardiologist on April 5th to rule out possible cardiovascular component. Pt has not seen an ENT.Other PMH significant for: HLD, HTN, dysphagia, OSA on CPAP, neck pain, L and R knee pain, memory change, paresthesia of both hands, hearing  loss, total knee replacement, fatigue, near syncope, B carpal tunnel, severe cervical degenerative joint disease, spinal stenosis C4-5   ? Limitations Walking;House hold activities;Lifting;Standing   ? How long can you sit comfortably? not limited   ? How long can you stand comfortably? 15-20 minutes reports "I don't have the energy I used to"   ? How long can you walk comfortably? 1 mile, limited due to decreased energy   ? Diagnostic tests Per chart: " MR BRAIN W WO CONTRAST 11/26/2021 IMPRESSION:  No evidence of recent infarction, hemorrhage, or mass. No abnormal  enhancement.      Minor chronic microvascular ischemic changes.  "   ? Patient Stated Goals Pt would like to decrease his wooziness   ? Currently in Pain? No/denies   ? ?  ?  ? ?  ? ? ? ? ?INTERVENTIONS ? ?NMR: ?Dix-Hallpike: Pt reports dizziness with L side, none with R side.  ?Epley maneuver 1x to treat L. Pt reports 3/10 dizziness. ? ? ?Seated bend and reach with visual tracking of red ball 5x each direction. Reports dizziness increases to 3/10. Pt takes rest interval in order to decrease dizziness. ?--repeated intervention multicolored ball 4x up and down. Pt reports dizziness reaches 3/10. Pt with brief rest interval to decrease symptoms.  ? ?Seated twist with visual tracking of red ball 5x each direction. Pt reports dizziness reaches 2/10. Pt takes brief rest interval to decrease symptoms. ?-repeated intervention with multicolored ball 4x each direction. Pt reports dizziness is "close to a 3/10." Pt takes brief rest interval to decrease symptoms. ? ?At support surface, gait belt donned and CGA provided: ?Tandem stance 2x30 sec each LE. Pt uses intermittent UE support on bar. ? ? ?Therex - ?PT instructs pt through the following interventions to promote cervical mobility for improved performance of HEP and scanning of environment- ?Cervical rotation 2x30 sec B ?Stretch of cervical extensors - 60 sec ?Stretch of cervical flexors - 60 sec  ?Cervical lateral flexion stretch 2x30 sec each side ?Scapular squeeze 2x10 ? ? ?PT provides patient with information packet about symptoms to expect with VR exercises. Additionally, PT instructs pt in intervention added to HEP: ? ?Access Code: [email or web address] ?URL: [email or web address] ?Date: 03/25/2022 ?Prepared by: Ricard Dillon ? ?Exercises ?- Standing Tandem Balance with Counter Support  - 1 x daily - 7 x weekly - 2 sets - 2 reps - 30 hold ? ?Pt verbalizes understanding for all and returns demonstration.  ? ?Pt educated throughout session about proper posture and technique with  exercises. Improved exercise technique, movement at target joints, use of target muscles after min to mod verbal, visual, tactile cues. ? ? ?Pt educated throughout session about proper posture and technique with exercises. Improved exercise technique, movement at target joints, use of target muscles after min to mod verbal, visual, tactile cues. ? ? ? ? ? PT Education - 03/25/22 1936   ? ? Education Details exercise technique, body mechanics, HEP   ? Person(s) Educated Patient   ? Methods Explanation;Demonstration;Verbal cues;Tactile cues;Handout   ? Comprehension Verbalized understanding;Returned demonstration;Verbal cues required;Need further instruction   ? ?  ?  ? ?  ? ? ? PT Short Term Goals - 03/10/22 1501   ? ?  ? PT SHORT TERM GOAL #1  ? Title Pt will be independent with HEP to improve balance and functional mobility in order to decrease fall risk and improve function at home and work.   ? Baseline 3/22: to be issued next 1-2  visits   ? Time 6   ? Period Weeks   ? Status New   ? Target Date 04/21/22   ? ?  ?  ? ?  ? ? ? ? PT Long Term Goals - 03/15/22 1705   ? ?  ? PT LONG TERM GOAL #1  ? Title Patient will increase FOTO score to equal to or greater than 58 to demonstrate statistically significant improvement in mobility and quality of life.   ? Baseline 3/22: 53   ? Time 12   ? Period Weeks   ? Status New   ? Target Date 06/02/22   ?  ? PT LONG TERM GOAL #2  ? Title Pt will decrease DHI score by at least 18 points in order to demonstrate clinically significant reduction in disability   ? Baseline 3/27: 28   ? Time 12   ? Period Weeks   ? Status New   ? Target Date 06/02/22   ?  ? PT LONG TERM GOAL #3  ? Title Pt will improve DGI by at least 3 points in order to demonstrate clinically significant improvement in balance and decreased risk for falls.   ? Baseline 3/27: 20   ? Time 12   ? Period Weeks   ? Status New   ? Target Date 06/03/22   ?  ? PT LONG TERM GOAL #4  ? Title Pt will report a decrease in  wooziness symptoms with daily activities by at least 50% in order to increase QOL and decrease fall risk.   ? Baseline 3/22: Pt currently limited due to wooziness/dizziness, most triggered by bending, looking up and turn

## 2022-03-30 ENCOUNTER — Ambulatory Visit: Payer: PPO

## 2022-03-30 DIAGNOSIS — R2681 Unsteadiness on feet: Secondary | ICD-10-CM

## 2022-03-30 DIAGNOSIS — R42 Dizziness and giddiness: Secondary | ICD-10-CM

## 2022-03-30 DIAGNOSIS — R262 Difficulty in walking, not elsewhere classified: Secondary | ICD-10-CM

## 2022-03-30 NOTE — Therapy (Signed)
[facility] ?[place], [place], [address] ?Phone: [phone]   Fax:  [phone] ? ?Physical Therapy Treatment ? ?Patient Details  ?Name: Brian Pearson ?MRN: [id number] ?Date of Birth: 1936/12/01 ?Referring Provider (PT): Vladimir Crofts, MD ? ? ?Encounter Date: 03/30/2022 ? ? PT End of Session - 03/30/22 1721   ? ? Visit Number 4   ? Number of Visits 25   ? Date for PT Re-Evaluation 06/02/22   ? PT Start Time 2426   ? PT Stop Time 1600   ? PT Time Calculation (min) 43 min   ? Equipment Utilized During Treatment Gait belt   ? Activity Tolerance Patient tolerated treatment well   ? Behavior During Therapy [facility] for tasks assessed/performed   ? ?  ?  ? ?  ? ? ?Past Medical History:  ?Diagnosis Date  ? Arthritis   ? BPH with obstruction/lower urinary tract symptoms   ? GERD (gastroesophageal reflux disease)   ? Hyperlipidemia   ? taken off simvastatin last year  ? Hypertension   ? Obesity   ? Pneumonia   ? hospitalized   ? Sleep apnea   ? uses cpap machine   ? Urinary frequency   ? Urine incontinence   ? ? ?Past Surgical History:  ?Procedure Laterality Date  ? COLONOSCOPY WITH PROPOFOL N/A 02/09/2016  ? Procedure: COLONOSCOPY WITH PROPOFOL;  Surgeon: Hulen Luster, MD;  Location: [facility] ENDOSCOPY;  Service: Gastroenterology;  Laterality: N/A;  ? ESOPHAGOGASTRODUODENOSCOPY (EGD) WITH PROPOFOL N/A 02/09/2016  ? Procedure: ESOPHAGOGASTRODUODENOSCOPY (EGD) WITH PROPOFOL;  Surgeon: Hulen Luster, MD;  Location: [facility] ENDOSCOPY;  Service: Gastroenterology;  Laterality: N/A;  ? HERNIA REPAIR    ? 1990's - 3 surgeries  ? KNEE ARTHROPLASTY Left 04/01/2021  ? Procedure: COMPUTER ASSISTED TOTAL KNEE ARTHROPLASTY;  Surgeon: Dereck Leep, MD;  Location: [facility] ORS;  Service: Orthopedics;  Laterality: Left;  ? ? ?There were no vitals filed for this visit. ? ? ? Subjective Assessment - 03/30/22 1719   ? ? Subjective Pt does report some episodes of lightheadedness today. Pt  reports he has had busy day and thinks it worsened with stress. Pt reports he still has not had any further difficulty/symptoms with getting in and out of his bed.   ? Pertinent History Pt is a pleasant 86 y/o male presenting to PT due to chronic wooziness (onset June 2022). Pt describes his symptoms as sensation of ?sloshing? in his head, unsteadiness, and lightheadedness in addition to wooziness. He denies spinning but does sometimes feel as if he is moving. He reports good and bad symptom days. His symptoms are worse when first waking up in the morning, affecting his balance. He holds onto things to steady himself. Pt denies headaches. Pt does report occasional ringing in his ears, denies aural fullness. Pt wears hearing aids.  He has numbness and tingling in his hands. Pt has no other hx of similar symptoms. Pt enjoys being active but feels limited now due to his symptoms. Pt with hx of L knee surgery approximately one year ago. He has first appointment with cardiologist on April 5th to rule out possible cardiovascular component. Pt has not seen an ENT.Other PMH significant for: HLD, HTN, dysphagia, OSA on CPAP, neck pain, L and R knee pain, memory change, paresthesia of both hands, hearing loss, total knee replacement, fatigue, near syncope, B carpal tunnel, severe cervical degenerative joint disease, spinal stenosis C4-5   ?  Limitations Walking;House hold activities;Lifting;Standing   ? How long can you sit comfortably? not limited   ? How long can you stand comfortably? 15-20 minutes reports "I don't have the energy I used to"   ? How long can you walk comfortably? 1 mile, limited due to decreased energy   ? Diagnostic tests Per chart: " MR BRAIN W WO CONTRAST 11/26/2021 IMPRESSION:  No evidence of recent infarction, hemorrhage, or mass. No abnormal  enhancement.     Minor chronic microvascular ischemic changes.  "   ? Patient Stated Goals Pt would like to decrease his wooziness   ? Currently in Pain?  No/denies   ? ?  ?  ? ?  ? ? ? ? ?INTERVENTIONS ? ? ?  ?NMR: Gait belt donned and CGA provided throughout unless otherwise specified  ? ?Seated twist with visual tracking of multicolor ball 10x each direction. Pt reports feeling a little lightheaded, rates it 4/10.  ?  ?At support surface: ?SLB 2x30 sec each LE, UUE support on bar ?Tandem stance 30 sec each LE ?Standing on airex WBOS EO 30 sec ?--transitioned to EC 2x30 sec. Increased sway. Intermittent UE support. ? ?Over 10 meters: ?Walking marches for SLB progression 2x through. ?Semi-tandem walking (widened step-length to accommodate for challenge) 2x over 10 meters ?Pt takes rest breaks between sets. Reports increase in symptoms when he turns around to ambulate back over 10 meters  (rates symptoms 4/10).  ? ?After pt symptoms decreased (pt performed therex prior), he completed the following: ?Seated VORx1, plain background, horizontal head turns 2x30 sec ?Dizziness reaches 2/10.  ? ?Seated VORx1, plain background, vertical head turns 2x30 sec ?Dizziness reaches 2/10 ? ?Instructed in box breathing and 2:4 breathing technique for symptom modulation. Pt returns demonstration within session but will likely require further instruction.  ? ?Therex - ?PT instructs pt through the following interventions to promote cervical mobility for improved performance of HEP and scanning of environment- ?Cervical rotation 1x30 sec B ?Stretch of cervical extensors - 30 sec ?Stretch of cervical flexors - 30 sec  ?Cervical lateral flexion stretch 30 sec each side ? ?Orthostatics: ?Supine - 147/60 mmHg, HR 75 bpm ?Seated -  136/64 mmHg,  HR 82 bpm. Pt does report feeling a little lightheaded when transitioning to seated. This decreases after several minutes. ?Standing -  136/59 mmHg, HR 78 bpm. No dizziness/lightheadedness. ?  ?Pt educated throughout session about proper posture and technique with exercises. Improved exercise technique, movement at target joints, use of target muscles  after min to mod verbal, visual, tactile cues. ? ? PT Education - 03/30/22 1720   ? ? Education Details exercise technique, body mechanics   ? Person(s) Educated Patient   ? Methods Explanation;Demonstration;Verbal cues   ? Comprehension Verbalized understanding;Returned demonstration;Verbal cues required;Need further instruction   ? ?  ?  ? ?  ? ? ? PT Short Term Goals - 03/10/22 1501   ? ?  ? PT SHORT TERM GOAL #1  ? Title Pt will be independent with HEP to improve balance and functional mobility in order to decrease fall risk and improve function at home and work.   ? Baseline 3/22: to be issued next 1-2 visits   ? Time 6   ? Period Weeks   ? Status New   ? Target Date 04/21/22   ? ?  ?  ? ?  ? ? ? ? PT Long Term Goals - 03/15/22 1705   ? ?  ? PT LONG  TERM GOAL #1  ? Title Patient will increase FOTO score to equal to or greater than 58 to demonstrate statistically significant improvement in mobility and quality of life.   ? Baseline 3/22: 53   ? Time 12   ? Period Weeks   ? Status New   ? Target Date 06/02/22   ?  ? PT LONG TERM GOAL #2  ? Title Pt will decrease DHI score by at least 18 points in order to demonstrate clinically significant reduction in disability   ? Baseline 3/27: 28   ? Time 12   ? Period Weeks   ? Status New   ? Target Date 06/02/22   ?  ? PT LONG TERM GOAL #3  ? Title Pt will improve DGI by at least 3 points in order to demonstrate clinically significant improvement in balance and decreased risk for falls.   ? Baseline 3/27: 20   ? Time 12   ? Period Weeks   ? Status New   ? Target Date 06/03/22   ?  ? PT LONG TERM GOAL #4  ? Title Pt will report a decrease in wooziness symptoms with daily activities by at least 50% in order to increase QOL and decrease fall risk.   ? Baseline 3/22: Pt currently limited due to wooziness/dizziness, most triggered by bending, looking up and turning his head.   ? Time 12   ? Period Weeks   ? Status New   ? Target Date 06/02/22   ? ?  ?  ? ?  ? ? ? ? ? ? ? ? Plan  - 03/30/22 1721   ? ? Clinical Impression Statement Pt with excellent motivation to participate in session. Orthostatics assessed. Pt only had 11 point drop in SBP seen with transition from supine>seated (147/6

## 2022-03-31 ENCOUNTER — Other Ambulatory Visit: Payer: Self-pay | Admitting: Internal Medicine

## 2022-04-02 ENCOUNTER — Other Ambulatory Visit: Payer: Self-pay | Admitting: Internal Medicine

## 2022-04-02 DIAGNOSIS — E785 Hyperlipidemia, unspecified: Secondary | ICD-10-CM

## 2022-04-05 ENCOUNTER — Ambulatory Visit: Payer: PPO

## 2022-04-05 DIAGNOSIS — R42 Dizziness and giddiness: Secondary | ICD-10-CM | POA: Diagnosis not present

## 2022-04-05 DIAGNOSIS — M6281 Muscle weakness (generalized): Secondary | ICD-10-CM

## 2022-04-05 DIAGNOSIS — R278 Other lack of coordination: Secondary | ICD-10-CM

## 2022-04-05 DIAGNOSIS — R262 Difficulty in walking, not elsewhere classified: Secondary | ICD-10-CM

## 2022-04-05 DIAGNOSIS — R2689 Other abnormalities of gait and mobility: Secondary | ICD-10-CM

## 2022-04-05 DIAGNOSIS — R2681 Unsteadiness on feet: Secondary | ICD-10-CM

## 2022-04-05 NOTE — Therapy (Signed)
[facility] ?[place], [place], [address] ?Phone: [phone]   Fax:  [phone] ? ?Physical Therapy Treatment ? ?Patient Details  ?Name: Brian Pearson ?MRN: [id number] ?Date of Birth: Apr 25, 1936 ?Referring Provider (PT): Vladimir Crofts, MD ? ? ?Encounter Date: 04/05/2022 ? ? PT End of Session - 04/05/22 1610   ? ? Visit Number 5   ? Number of Visits 25   ? Date for PT Re-Evaluation 06/02/22   ? Authorization Type [employer] (no visit limits)   ? Authorization Time Period 03/10/22-06/02/22   ? Progress Note Due on Visit 10   ? PT Start Time 1603   ? PT Stop Time 4627   ? PT Time Calculation (min) 40 min   ? Equipment Utilized During Treatment Gait belt   ? Activity Tolerance Patient tolerated treatment well   ? Behavior During Therapy [facility] for tasks assessed/performed   ? ?  ?  ? ?  ? ? ?Past Medical History:  ?Diagnosis Date  ? Arthritis   ? BPH with obstruction/lower urinary tract symptoms   ? GERD (gastroesophageal reflux disease)   ? Hyperlipidemia   ? taken off simvastatin last year  ? Hypertension   ? Obesity   ? Pneumonia   ? hospitalized   ? Sleep apnea   ? uses cpap machine   ? Urinary frequency   ? Urine incontinence   ? ? ?Past Surgical History:  ?Procedure Laterality Date  ? COLONOSCOPY WITH PROPOFOL N/A 02/09/2016  ? Procedure: COLONOSCOPY WITH PROPOFOL;  Surgeon: Hulen Luster, MD;  Location: [facility] ENDOSCOPY;  Service: Gastroenterology;  Laterality: N/A;  ? ESOPHAGOGASTRODUODENOSCOPY (EGD) WITH PROPOFOL N/A 02/09/2016  ? Procedure: ESOPHAGOGASTRODUODENOSCOPY (EGD) WITH PROPOFOL;  Surgeon: Hulen Luster, MD;  Location: [facility] ENDOSCOPY;  Service: Gastroenterology;  Laterality: N/A;  ? HERNIA REPAIR    ? 1990's - 3 surgeries  ? KNEE ARTHROPLASTY Left 04/01/2021  ? Procedure: COMPUTER ASSISTED TOTAL KNEE ARTHROPLASTY;  Surgeon: Dereck Leep, MD;  Location: [facility] ORS;  Service: Orthopedics;  Laterality: Left;  ? ? ?There were no vitals  filed for this visit. ? ? Subjective Assessment - 04/05/22 1609   ? ? Subjective Pt syas his symptoms have been particulary good today. He conitnues to work on his HEP.   ? Pertinent History Pt is a pleasant 86 y/o male presenting to PT due to chronic wooziness (onset June 2022). Pt describes his symptoms as sensation of ?sloshing? in his head, unsteadiness, and lightheadedness in addition to wooziness. He denies spinning but does sometimes feel as if he is moving. He reports good and bad symptom days. His symptoms are worse when first waking up in the morning, affecting his balance. He holds onto things to steady himself. Pt denies headaches. Pt does report occasional ringing in his ears, denies aural fullness. Pt wears hearing aids.  He has numbness and tingling in his hands. Pt has no other hx of similar symptoms. Pt enjoys being active but feels limited now due to his symptoms. Pt with hx of L knee surgery approximately one year ago. He has first appointment with cardiologist on April 5th to rule out possible cardiovascular component. Pt has not seen an ENT.Other PMH significant for: HLD, HTN, dysphagia, OSA on CPAP, neck pain, L and R knee pain, memory change, paresthesia of both hands, hearing loss, total knee replacement, fatigue, near syncope, B carpal tunnel, severe cervical degenerative joint disease, spinal stenosis C4-5   ?  Diagnostic tests Per chart: " MR BRAIN W WO CONTRAST 11/26/2021 IMPRESSION:  No evidence of recent infarction, hemorrhage, or mass. No abnormal  enhancement.     Minor chronic microvascular ischemic changes.  "   ? Patient Stated Goals Pt would like to decrease his wooziness   ? Currently in Pain? No/denies   ? ?  ?  ? ?  ? ? ? ?INTERVENTION: ?-seated trunk twist with ball VOR training 1x16 ?-seated upward gaze 1x15 ?-standing ball self toss and catch 1x15 ?-standing overhead rebound 1x15 ?-standing 90 degree trunk twist + overhead rebound alternating sides 1*16 ?-standing 90 degree  trunk twist + overhead rebound alternating sides 1*16 ? ?*All of the above provoke to ~3/10 dizziness, recovers after ~ 30-45 seconds ? ?-SLS 5x20sec, alternating sides, intermittent hand support c LOB  ? ?-WBOS airex foam 3x30sec eyes closed  ? ?-AMB 178f c visual scanning and reading of letters/numbers  ?-AMB with ball self toss fwd 1664f?-retroAMB with ball self toss 16024f?-fwd AMB c rotation scanning receiving ball and opposite handoff  ? ?-narrow stance foam horizontal head turns 1x20 bilat  ? ? ? ? PT Education - 04/05/22 1612   ? ? Education Details contnued to discuss program and symptoms modification   ? Person(s) Educated Patient   ? Methods Explanation;Demonstration   ? Comprehension Verbalized understanding;Returned demonstration   ? ?  ?  ? ?  ? ? ? PT Short Term Goals - 03/10/22 1501   ? ?  ? PT SHORT TERM GOAL #1  ? Title Pt will be independent with HEP to improve balance and functional mobility in order to decrease fall risk and improve function at home and work.   ? Baseline 3/22: to be issued next 1-2 visits   ? Time 6   ? Period Weeks   ? Status New   ? Target Date 04/21/22   ? ?  ?  ? ?  ? ? ? ? PT Long Term Goals - 03/15/22 1705   ? ?  ? PT LONG TERM GOAL #1  ? Title Patient will increase FOTO score to equal to or greater than 58 to demonstrate statistically significant improvement in mobility and quality of life.   ? Baseline 3/22: 53   ? Time 12   ? Period Weeks   ? Status New   ? Target Date 06/02/22   ?  ? PT LONG TERM GOAL #2  ? Title Pt will decrease DHI score by at least 18 points in order to demonstrate clinically significant reduction in disability   ? Baseline 3/27: 28   ? Time 12   ? Period Weeks   ? Status New   ? Target Date 06/02/22   ?  ? PT LONG TERM GOAL #3  ? Title Pt will improve DGI by at least 3 points in order to demonstrate clinically significant improvement in balance and decreased risk for falls.   ? Baseline 3/27: 20   ? Time 12   ? Period Weeks   ? Status New   ?  Target Date 06/03/22   ?  ? PT LONG TERM GOAL #4  ? Title Pt will report a decrease in wooziness symptoms with daily activities by at least 50% in order to increase QOL and decrease fall risk.   ? Baseline 3/22: Pt currently limited due to wooziness/dizziness, most triggered by bending, looking up and turning his head.   ? Time 12   ?  Period Weeks   ? Status New   ? Target Date 06/02/22   ? ?  ?  ? ?  ? ? ? ? ? ? ? ? Plan - 04/05/22 1613   ? ? Clinical Impression Statement Continued with vestibular interventions to reduce c/o dizziness. Pt still most provoked with overhead gaze paired c cervical extension. Pt has a few LOB in session, but self recovers without assistance. Most interventions are ended briefly after exacerbation of dizziness with good recovery after less than 1 minute. Pt remains motivated and focused, is progressing well toward goals of treatment in general.    ? Personal Factors and Comorbidities Age;Comorbidity 3+;Time since onset of injury/illness/exacerbation   ? Comorbidities Other PMH significant for: HLD, HTN, dysphagia, OSA on CPAP, neck pain, L and R knee pain, memory change, paresthesia of both hands, hearing loss, total knee replacement, fatigue, near syncope, B carpal tunnel, severe cervical degenerative joint disease, spinal stenosis C4-5   ? Examination-Activity Limitations Locomotion Level;Stand;Reach Overhead;Bend;Lift;Stairs   ? Examination-Participation Restrictions Yard Work;Community Activity;Shop;Cleaning;Meal Prep;Laundry   ? Stability/Clinical Decision Making Evolving/Moderate complexity   ? Clinical Decision Making Moderate   ? Rehab Potential Good   ? PT Frequency 2x / week   ? PT Duration 12 weeks   ? PT Treatment/Interventions ADLs/Self Care Home Management;Canalith Repostioning;Cryotherapy;Electrical Stimulation;Moist Heat;Traction;Ultrasound;DME Instruction;Gait training;Stair training;Functional mobility training;Therapeutic activities;Therapeutic exercise;Balance  training;Neuromuscular re-education;Patient/family education;Wheelchair mobility training;Manual techniques;Orthotic Fit/Training;Vestibular;Dry needling;Visual/perceptual remediation/compensation;Passive range o

## 2022-04-14 ENCOUNTER — Ambulatory Visit: Payer: PPO

## 2022-04-14 DIAGNOSIS — R42 Dizziness and giddiness: Secondary | ICD-10-CM | POA: Diagnosis not present

## 2022-04-14 DIAGNOSIS — R2681 Unsteadiness on feet: Secondary | ICD-10-CM

## 2022-04-14 NOTE — Therapy (Signed)
[facility] ?[place], [place], [address] ?Phone: [phone]   Fax:  [phone] ? ?Physical Therapy Treatment ? ?Patient Details  ?Name: Brian Pearson ?MRN: [id number] ?Date of Birth: [birth year] ?Referring Provider (PT): Vladimir Crofts, MD ? ? ?Encounter Date: 04/14/2022 ? ? PT End of Session - 04/14/22 1650   ? ? Visit Number 6   ? Number of Visits 25   ? Date for PT Re-Evaluation 06/02/22   ? Authorization Type [employer] (no visit limits)   ? Authorization Time Period 03/10/22-06/02/22   ? Progress Note Due on Visit 10   ? PT Start Time 1602   ? PT Stop Time 5188   ? PT Time Calculation (min) 43 min   ? Equipment Utilized During Treatment Gait belt   ? Activity Tolerance Patient tolerated treatment well   ? Behavior During Therapy [facility] for tasks assessed/performed   ? ?  ?  ? ?  ? ? ?Past Medical History:  ?Diagnosis Date  ? Arthritis   ? BPH with obstruction/lower urinary tract symptoms   ? GERD (gastroesophageal reflux disease)   ? Hyperlipidemia   ? taken off simvastatin last year  ? Hypertension   ? Obesity   ? Pneumonia   ? hospitalized   ? Sleep apnea   ? uses cpap machine   ? Urinary frequency   ? Urine incontinence   ? ? ?Past Surgical History:  ?Procedure Laterality Date  ? COLONOSCOPY WITH PROPOFOL N/A 02/09/2016  ? Procedure: COLONOSCOPY WITH PROPOFOL;  Surgeon: Hulen Luster, MD;  Location: [facility] ENDOSCOPY;  Service: Gastroenterology;  Laterality: N/A;  ? ESOPHAGOGASTRODUODENOSCOPY (EGD) WITH PROPOFOL N/A 02/09/2016  ? Procedure: ESOPHAGOGASTRODUODENOSCOPY (EGD) WITH PROPOFOL;  Surgeon: Hulen Luster, MD;  Location: [facility] ENDOSCOPY;  Service: Gastroenterology;  Laterality: N/A;  ? HERNIA REPAIR    ? 1990's - 3 surgeries  ? KNEE ARTHROPLASTY Left 04/01/2021  ? Procedure: COMPUTER ASSISTED TOTAL KNEE ARTHROPLASTY;  Surgeon: Dereck Leep, MD;  Location: [facility] ORS;  Service: Orthopedics;  Laterality: Left;  ? ? ?There were no vitals  filed for this visit. ? ? Subjective Assessment - 04/14/22 1602   ? ? Subjective Pt reports he has been feeling much better. He has had a good week.  He does not feel he is quite at 100% yet. He reports improved ability to look up and bend down. Pt reports baseline may be a 2/10 currently regarding dizziness.   ? Pertinent History Pt is a pleasant 86 y/o male presenting to PT due to chronic wooziness (onset June 2022). Pt describes his symptoms as sensation of ?sloshing? in his head, unsteadiness, and lightheadedness in addition to wooziness. He denies spinning but does sometimes feel as if he is moving. He reports good and bad symptom days. His symptoms are worse when first waking up in the morning, affecting his balance. He holds onto things to steady himself. Pt denies headaches. Pt does report occasional ringing in his ears, denies aural fullness. Pt wears hearing aids.  He has numbness and tingling in his hands. Pt has no other hx of similar symptoms. Pt enjoys being active but feels limited now due to his symptoms. Pt with hx of L knee surgery approximately one year ago. He has first appointment with cardiologist on April 5th to rule out possible cardiovascular component. Pt has not seen an ENT.Other PMH significant for: HLD, HTN, dysphagia, OSA on CPAP, neck pain, L and  R knee pain, memory change, paresthesia of both hands, hearing loss, total knee replacement, fatigue, near syncope, B carpal tunnel, severe cervical degenerative joint disease, spinal stenosis C4-5   ? Limitations Walking;House hold activities;Lifting;Standing   ? How long can you sit comfortably? not limited   ? How long can you stand comfortably? 15-20 minutes reports "I don't have the energy I used to"   ? How long can you walk comfortably? 1 mile, limited due to decreased energy   ? Diagnostic tests Per chart: " MR BRAIN W WO CONTRAST 11/26/2021 IMPRESSION:  No evidence of recent infarction, hemorrhage, or mass. No abnormal  enhancement.      Minor chronic microvascular ischemic changes.  "   ? Patient Stated Goals Pt would like to decrease his wooziness   ? Currently in Pain? No/denies   ? ?  ?  ? ?  ? ? ? ?INTERVENTIONS ?  ?  ?Therex - ?PT instructs pt through the following interventions to promote cervical mobility for improved performance of HEP and scanning of environment- ?Cervical rotation 1x30 sec B ?Stretch of cervical extensors - 30 sec ?Stretch of cervical flexors - 30 sec  ?Cervical lateral flexion stretch 30 sec each side ? ?  ?NMR: Gait belt donned and CGA provided throughout unless otherwise specified  ? ? ?Performed with NBOS, firm surface: ?Standing VORx1, plain background, horizontal head turns 2x30 sec ?Dizziness maintained at 2/10 (baseline).  ?  ?Standing VORx1, plain background, vertical head turns 2x30 sec ?Dizziness maintained at 2/10 (baseline) ? ?NBOS on airex: VORx1 with vertical and horizontal head turns 30 sec for each ? ?EC on airex NBOS 4x30 sec, min assist. Pt reports dizziness reaches 2.5/10.  ? ?SLB 2x30 sec each LE. Requires at least UUE support on bar.  ? ?Multicolor ball toss Rt to Lt and Lt to Rt wile walking over 10 meters.  ?Multicolor ball twist and pass while walking over 10 meters. Pt reports dizziness reaches 3/10. ? ?Semi-tandem stance on airex 2x30 sec each LE; intermittent UE support  ? ?Standing rolls against wall 4x. Performed slowly. Pt reports dizziness reaches 3/10. ? ?Over 10 meter track: ?Semi-tandem gait 2x through ?EC gait 2x through ?Ambulating backwards 2x through ?Slow marching 2x through ? ? Pt educated throughout session about proper posture and technique with exercises. Improved exercise technique, movement at target joints, use of target muscles after min to mod verbal, visual, tactile cues. ? ? ? ? PT Education - 04/14/22 1650   ? ? Education Details exercise technique; instruction to increase HEP VOR intervention by 1 minute   ? Person(s) Educated Patient   ? Methods  Explanation;Demonstration;Verbal cues   ? Comprehension Verbalized understanding;Returned demonstration   ? ?  ?  ? ?  ? ? ? PT Short Term Goals - 03/10/22 1501   ? ?  ? PT SHORT TERM GOAL #1  ? Title Pt will be independent with HEP to improve balance and functional mobility in order to decrease fall risk and improve function at home and work.   ? Baseline 3/22: to be issued next 1-2 visits   ? Time 6   ? Period Weeks   ? Status New   ? Target Date 04/21/22   ? ?  ?  ? ?  ? ? ? ? PT Long Term Goals - 03/15/22 1705   ? ?  ? PT LONG TERM GOAL #1  ? Title Patient will increase FOTO score to equal to or greater  than 58 to demonstrate statistically significant improvement in mobility and quality of life.   ? Baseline 3/22: 53   ? Time 12   ? Period Weeks   ? Status New   ? Target Date 06/02/22   ?  ? PT LONG TERM GOAL #2  ? Title Pt will decrease DHI score by at least 18 points in order to demonstrate clinically significant reduction in disability   ? Baseline 3/27: 28   ? Time 12   ? Period Weeks   ? Status New   ? Target Date 06/02/22   ?  ? PT LONG TERM GOAL #3  ? Title Pt will improve DGI by at least 3 points in order to demonstrate clinically significant improvement in balance and decreased risk for falls.   ? Baseline 3/27: 20   ? Time 12   ? Period Weeks   ? Status New   ? Target Date 06/03/22   ?  ? PT LONG TERM GOAL #4  ? Title Pt will report a decrease in wooziness symptoms with daily activities by at least 50% in order to increase QOL and decrease fall risk.   ? Baseline 3/22: Pt currently limited due to wooziness/dizziness, most triggered by bending, looking up and turning his head.   ? Time 12   ? Period Weeks   ? Status New   ? Target Date 06/02/22   ? ?  ?  ? ?  ? ? ? ? ? ? ? ? Plan - 04/14/22 1651   ? ? Clinical Impression Statement Pt with reports of continued improvements outside of PT with dizziness symptoms, indicating functional carryover. Pt able to advance today to standing VORx1 (vertical and  horizontal head turns) on both firm and compliant surfaces. Pt was able to maintain his balance in all conditions and dizziness did not increase beyond baseline level with these interventions. The pt will benefit from further sk

## 2022-04-21 ENCOUNTER — Ambulatory Visit: Payer: PPO | Attending: Neurology

## 2022-04-21 DIAGNOSIS — R2681 Unsteadiness on feet: Secondary | ICD-10-CM | POA: Diagnosis not present

## 2022-04-21 DIAGNOSIS — R262 Difficulty in walking, not elsewhere classified: Secondary | ICD-10-CM | POA: Insufficient documentation

## 2022-04-21 DIAGNOSIS — R42 Dizziness and giddiness: Secondary | ICD-10-CM | POA: Insufficient documentation

## 2022-04-21 NOTE — Therapy (Signed)
[facility] ?[place], [place], [address] ?Phone: [phone]   Fax:  [phone] ? ?Physical Therapy Treatment ? ?Patient Details  ?Name: Brian Pearson ?MRN: [id number] ?Date of Birth: Jun 14, 1936 ?Referring Provider (PT): Vladimir Crofts, MD ? ? ?Encounter Date: 04/21/2022 ? ? PT End of Session - 04/21/22 0935   ? ? Visit Number 7   ? Number of Visits 25   ? Date for PT Re-Evaluation 06/02/22   ? Authorization Type [employer] (no visit limits)   ? Authorization Time Period 03/10/22-06/02/22   ? Progress Note Due on Visit 10   ? PT Start Time [phone]   ? PT Stop Time 1015   ? PT Time Calculation (min) 44 min   ? Equipment Utilized During Treatment Gait belt   ? Activity Tolerance Patient tolerated treatment well   ? Behavior During Therapy [facility] for tasks assessed/performed   ? ?  ?  ? ?  ? ? ?Past Medical History:  ?Diagnosis Date  ? Arthritis   ? BPH with obstruction/lower urinary tract symptoms   ? GERD (gastroesophageal reflux disease)   ? Hyperlipidemia   ? taken off simvastatin last year  ? Hypertension   ? Obesity   ? Pneumonia   ? hospitalized   ? Sleep apnea   ? uses cpap machine   ? Urinary frequency   ? Urine incontinence   ? ? ?Past Surgical History:  ?Procedure Laterality Date  ? COLONOSCOPY WITH PROPOFOL N/A 02/09/2016  ? Procedure: COLONOSCOPY WITH PROPOFOL;  Surgeon: Hulen Luster, MD;  Location: [facility] ENDOSCOPY;  Service: Gastroenterology;  Laterality: N/A;  ? ESOPHAGOGASTRODUODENOSCOPY (EGD) WITH PROPOFOL N/A 02/09/2016  ? Procedure: ESOPHAGOGASTRODUODENOSCOPY (EGD) WITH PROPOFOL;  Surgeon: Hulen Luster, MD;  Location: [facility] ENDOSCOPY;  Service: Gastroenterology;  Laterality: N/A;  ? HERNIA REPAIR    ? 1990's - 3 surgeries  ? KNEE ARTHROPLASTY Left 04/01/2021  ? Procedure: COMPUTER ASSISTED TOTAL KNEE ARTHROPLASTY;  Surgeon: Dereck Leep, MD;  Location: [facility] ORS;  Service: Orthopedics;  Laterality: Left;  ? ? ?There were no vitals filed  for this visit. ? ? Subjective Assessment - 04/21/22 0930   ? ? Subjective Pt reports he has "not really been dizzy" over the past several days. Pt's dizziness has reached "maybe a 1/10 this morning." Pt reports he feels overall his symptoms have improved. HEP going well. Pt says looking up is still the most difficult movement for him.   ? Pertinent History Pt is a pleasant 86 y/o male presenting to PT due to chronic wooziness (onset June 2022). Pt describes his symptoms as sensation of ?sloshing? in his head, unsteadiness, and lightheadedness in addition to wooziness. He denies spinning but does sometimes feel as if he is moving. He reports good and bad symptom days. His symptoms are worse when first waking up in the morning, affecting his balance. He holds onto things to steady himself. Pt denies headaches. Pt does report occasional ringing in his ears, denies aural fullness. Pt wears hearing aids.  He has numbness and tingling in his hands. Pt has no other hx of similar symptoms. Pt enjoys being active but feels limited now due to his symptoms. Pt with hx of L knee surgery approximately one year ago. He has first appointment with cardiologist on April 5th to rule out possible cardiovascular component. Pt has not seen an ENT.Other PMH significant for: HLD, HTN, dysphagia, OSA on CPAP, neck pain, L  and R knee pain, memory change, paresthesia of both hands, hearing loss, total knee replacement, fatigue, near syncope, B carpal tunnel, severe cervical degenerative joint disease, spinal stenosis C4-5   ? Limitations Walking;House hold activities;Lifting;Standing   ? How long can you sit comfortably? not limited   ? How long can you stand comfortably? 15-20 minutes reports "I don't have the energy I used to"   ? How long can you walk comfortably? 1 mile, limited due to decreased energy   ? Diagnostic tests Per chart: " MR BRAIN W WO CONTRAST 11/26/2021 IMPRESSION:  No evidence of recent infarction, hemorrhage, or mass.  No abnormal  enhancement.     Minor chronic microvascular ischemic changes.  "   ? Patient Stated Goals Pt would like to decrease his wooziness   ? Currently in Pain? No/denies   ? ?  ?  ? ?  ? ? ?  ?INTERVENTIONS ?  ?  ?Therex - ?PT instructs pt through the following interventions to promote cervical mobility for improved performance of HEP and scanning of environment- ?Cervical rotation 2x30 sec B ?Stretch of cervical extensors - 60 sec ?Stretch of cervical flexors - 60 sec  ?Cervical lateral flexion stretch 2x30 sec each side ?  ?  ?NMR: Gait belt donned and CGA provided throughout unless otherwise specified  ? ?Standing VORx1 with busy background, vertical and horizontal head turns 2x30 sec of each. ?  ?Bending and reaching to pick up cones, hand them to PT and then place them back on the floor (6 cones) 1x through. No symptoms with intervention. ? ?Seated visual tracking of multicolored ball bend and reach x 2 minutes. Pt reports no dizziness. ? ?Ambulation with diagonal head turns 4x10 meters. Close CGA provided. Pt with increased variability in BOS/mild unsteadiness. Rates dizziness as 2/10. ?Pt takes recovery interval following intervention. ? ?Ambulation with head nod 2x10 meters. Pt reports mild increase in dizziness. ? ?Manual - pt supine on plinth ?Gentle cervical traction 5x15-20 sec. ?STM throughout B cervical paraspinals and muscles of occipital triangle x several minutes. Pt TTP throughout entire region bilat ?Pt held in long-duration UT stretch B as PT provides STM (listed above) ? ?PT instructs pt in advanced HEP: ?Access Code: [email or web address] ?URL: [email or web address] ?Date: 04/21/2022 ?Prepared by: Ricard Dillon ? ?Exercises ?- Seated Gaze Stabilization with Head Rotation  - 1 x daily - 7 x weekly - 2 sets - 3 reps - 60 hold ?- Seated Gaze Stabilization with Head Nod  - 1 x daily - 7 x weekly - 2 sets - 3 reps - 60 hold ? ? Pt educated throughout session about proper posture and technique  with exercises. Improved exercise technique, movement at target joints, use of target muscles after min to mod verbal, visual, tactile cues. ? ? ? ? ? PT Education - 04/21/22 0934   ? ? Education Details exercise technique   ? Person(s) Educated Patient   ? Methods Explanation;Demonstration;Tactile cues;Verbal cues   ? Comprehension Verbalized understanding;Need further instruction   ? ?  ?  ? ?  ? ? ? PT Short Term Goals - 03/10/22 1501   ? ?  ? PT SHORT TERM GOAL #1  ? Title Pt will be independent with HEP to improve balance and functional mobility in order to decrease fall risk and improve function at home and work.   ? Baseline 3/22: to be issued next 1-2 visits   ? Time 6   ? Period Weeks   ? Status  New   ? Target Date 04/21/22   ? ?  ?  ? ?  ? ? ? ? PT Long Term Goals - 03/15/22 1705   ? ?  ? PT LONG TERM GOAL #1  ? Title Patient will increase FOTO score to equal to or greater than 58 to demonstrate statistically significant improvement in mobility and quality of life.   ? Baseline 3/22: 53   ? Time 12   ? Period Weeks   ? Status New   ? Target Date 06/02/22   ?  ? PT LONG TERM GOAL #2  ? Title Pt will decrease DHI score by at least 18 points in order to demonstrate clinically significant reduction in disability   ? Baseline 3/27: 28   ? Time 12   ? Period Weeks   ? Status New   ? Target Date 06/02/22   ?  ? PT LONG TERM GOAL #3  ? Title Pt will improve DGI by at least 3 points in order to demonstrate clinically significant improvement in balance and decreased risk for falls.   ? Baseline 3/27: 20   ? Time 12   ? Period Weeks   ? Status New   ? Target Date 06/03/22   ?  ? PT LONG TERM GOAL #4  ? Title Pt will report a decrease in wooziness symptoms with daily activities by at least 50% in order to increase QOL and decrease fall risk.   ? Baseline 3/22: Pt currently limited due to wooziness/dizziness, most triggered by bending, looking up and turning his head.   ? Time 12   ? Period Weeks   ? Status New   ?  Target Date 06/02/22   ? ?  ?  ? ?  ? ? ? ? ? ? ? ? Plan - 04/21/22 1459   ? ? Clinical Impression Statement Pt with dizziness no greater than 2/10 on this date with interventions. Symptoms resolved gen

## 2022-04-26 ENCOUNTER — Ambulatory Visit: Payer: PPO

## 2022-04-26 DIAGNOSIS — R42 Dizziness and giddiness: Secondary | ICD-10-CM | POA: Diagnosis not present

## 2022-04-26 NOTE — Therapy (Signed)
[facility] ?[place], [place], [address] ?Phone: [phone]   Fax:  [phone] ? ?Physical Therapy Treatment/DISCHARGE SUMMARY ? ?Patient Details  ?Name: Brian Pearson ?MRN: [id number] ?Date of Birth: 27-Mar-1936 ?Referring Provider (PT): Vladimir Crofts, MD ? ? ?Encounter Date: 04/26/2022 ? ? PT End of Session - 04/26/22 1436   ? ? Visit Number 8   ? Number of Visits 25   ? Date for PT Re-Evaluation 06/02/22   ? Authorization Type [employer] (no visit limits)   ? Authorization Time Period 03/10/22-06/02/22   ? Progress Note Due on Visit 10   ? PT Start Time 1434   ? PT Stop Time 1455   ? PT Time Calculation (min) 21 min   ? Equipment Utilized During Treatment Gait belt   ? Activity Tolerance Patient tolerated treatment well   ? Behavior During Therapy [facility] for tasks assessed/performed   ? ?  ?  ? ?  ? ? ?Past Medical History:  ?Diagnosis Date  ? Arthritis   ? BPH with obstruction/lower urinary tract symptoms   ? GERD (gastroesophageal reflux disease)   ? Hyperlipidemia   ? taken off simvastatin last year  ? Hypertension   ? Obesity   ? Pneumonia   ? hospitalized   ? Sleep apnea   ? uses cpap machine   ? Urinary frequency   ? Urine incontinence   ? ? ?Past Surgical History:  ?Procedure Laterality Date  ? COLONOSCOPY WITH PROPOFOL N/A 02/09/2016  ? Procedure: COLONOSCOPY WITH PROPOFOL;  Surgeon: Hulen Luster, MD;  Location: [facility] ENDOSCOPY;  Service: Gastroenterology;  Laterality: N/A;  ? ESOPHAGOGASTRODUODENOSCOPY (EGD) WITH PROPOFOL N/A 02/09/2016  ? Procedure: ESOPHAGOGASTRODUODENOSCOPY (EGD) WITH PROPOFOL;  Surgeon: Hulen Luster, MD;  Location: [facility] ENDOSCOPY;  Service: Gastroenterology;  Laterality: N/A;  ? HERNIA REPAIR    ? 1990's - 3 surgeries  ? KNEE ARTHROPLASTY Left 04/01/2021  ? Procedure: COMPUTER ASSISTED TOTAL KNEE ARTHROPLASTY;  Surgeon: Dereck Leep, MD;  Location: [facility] ORS;  Service: Orthopedics;  Laterality: Left;  ? ? ?There  were no vitals filed for this visit. ? ? Subjective Assessment - 04/26/22 1434   ? ? Subjective Pt reports very little dizziness. He reports it maybe gets to 1-2/10 every once in a while. He reports it's better than it use to be. Only time he has symptoms is when he is standing for a while. Pt feels he is ready to discharge from PT.   ? Pertinent History Pt is a pleasant 86 y/o male presenting to PT due to chronic wooziness (onset June 2022). Pt describes his symptoms as sensation of ?sloshing? in his head, unsteadiness, and lightheadedness in addition to wooziness. He denies spinning but does sometimes feel as if he is moving. He reports good and bad symptom days. His symptoms are worse when first waking up in the morning, affecting his balance. He holds onto things to steady himself. Pt denies headaches. Pt does report occasional ringing in his ears, denies aural fullness. Pt wears hearing aids.  He has numbness and tingling in his hands. Pt has no other hx of similar symptoms. Pt enjoys being active but feels limited now due to his symptoms. Pt with hx of L knee surgery approximately one year ago. He has first appointment with cardiologist on April 5th to rule out possible cardiovascular component. Pt has not seen an ENT.Other PMH significant for: HLD, HTN, dysphagia, OSA on CPAP,  neck pain, L and R knee pain, memory change, paresthesia of both hands, hearing loss, total knee replacement, fatigue, near syncope, B carpal tunnel, severe cervical degenerative joint disease, spinal stenosis C4-5   ? Limitations Walking;House hold activities;Lifting;Standing   ? How long can you sit comfortably? not limited   ? How long can you stand comfortably? 15-20 minutes reports "I don't have the energy I used to"   ? How long can you walk comfortably? 1 mile, limited due to decreased energy   ? Diagnostic tests Per chart: " MR BRAIN W WO CONTRAST 11/26/2021 IMPRESSION:  No evidence of recent infarction, hemorrhage, or mass. No  abnormal  enhancement.     Minor chronic microvascular ischemic changes.  "   ? Patient Stated Goals Pt would like to decrease his wooziness   ? Currently in Pain? No/denies   ? ?  ?  ? ?  ? ? ? ? ? [facility] PT Assessment - 04/26/22 0001   ? ?  ? Dynamic Gait Index  ? Level Surface Normal   ? Change in Gait Speed Normal   ? Gait with Horizontal Head Turns Mild Impairment   ? Gait with Vertical Head Turns Normal   ? Gait and Pivot Turn Normal   ? Step Over Obstacle Normal   ? Step Around Obstacles Normal   ? Steps Mild Impairment   ? Total Score 22   ? ?  ?  ? ?  ? ? ? ? ?INTERVENTIONS - goals reassessed as pt feels ready for discharge. See goal section for details. ? ?Instructed in d/c recommendations. ? ?Pt has met all but one therapy goal, where last goal was partially met.  ? ? ?Pt educated throughout session about proper posture and technique with exercises. Improved exercise technique, movement at target joints, use of target muscles after min to mod verbal, visual, tactile cues. ? ? ? ? ? ? ? PT Education - 04/26/22 1459   ? ? Education Details discharge recommendations, continue HEP to maintain and continue gains, contact if have questions or seek new referral if symptoms worsen again   ? Person(s) Educated Patient   ? Methods Explanation   ? Comprehension Verbalized understanding   ? ?  ?  ? ?  ? ? ? PT Short Term Goals - 04/26/22 1436   ? ?  ? PT SHORT TERM GOAL #1  ? Title Pt will be independent with HEP to improve balance and functional mobility in order to decrease fall risk and improve function at home and work.   ? Baseline 3/22: to be issued next 1-2 visits; 5/8: pt feels independent   ? Time 6   ? Period Weeks   ? Status Achieved   ? Target Date 04/21/22   ? ?  ?  ? ?  ? ? ? ? PT Long Term Goals - 04/26/22 1437   ? ?  ? PT LONG TERM GOAL #1  ? Title Patient will increase FOTO score to equal to or greater than 58 to demonstrate statistically significant improvement in mobility and quality of life.   ?  Baseline 3/22: 53; 5/8: 70   ? Time 12   ? Period Weeks   ? Status Achieved   ? Target Date 06/02/22   ?  ? PT LONG TERM GOAL #2  ? Title Pt will decrease DHI score by at least 18 points in order to demonstrate clinically significant reduction in disability   ? Baseline 3/27:  28; 5/8: 2   ? Time 12   ? Period Weeks   ? Status Achieved   ? Target Date 06/02/22   ?  ? PT LONG TERM GOAL #3  ? Title Pt will improve DGI by at least 3 points in order to demonstrate clinically significant improvement in balance and decreased risk for falls.   ? Baseline 3/27: 20; 5/8: 22   ? Time 12   ? Period Weeks   ? Status Partially Met   ? Target Date 06/03/22   ?  ? PT LONG TERM GOAL #4  ? Title Pt will report a decrease in wooziness symptoms with daily activities by at least 50% in order to increase QOL and decrease fall risk.   ? Baseline 3/22: Pt currently limited due to wooziness/dizziness, most triggered by bending, looking up and turning his head. 5/8: pt feels symptoms have decreased by 65% at least   ? Time 12   ? Period Weeks   ? Status Achieved   ? Target Date 06/02/22   ? ?  ?  ? ?  ? ? ? ? ? ? ? ? Plan - 04/26/22 1500   ? ? Clinical Impression Statement Pt has partially met one therapy goal and achieved all other remaining therapy goals. This indicates decreased fall risk, decreased dizziness, reported decreased symptoms by at least 65%, and low handicap due to dizziness (DGI score 2/100). He feels his symptoms have significantly reduced and do not occur often anymore per report (see subjective). Pt feels independent with his HEP and is agreeable to plan to discharge from PT. PT advised pt on discharge recs. The pt does not require further skilled PT at this time.   ? Personal Factors and Comorbidities Age;Comorbidity 3+;Time since onset of injury/illness/exacerbation   ? Comorbidities Other PMH significant for: HLD, HTN, dysphagia, OSA on CPAP, neck pain, L and R knee pain, memory change, paresthesia of both hands,  hearing loss, total knee replacement, fatigue, near syncope, B carpal tunnel, severe cervical degenerative joint disease, spinal stenosis C4-5   ? Examination-Activity Limitations Locomotion Level;Stand;Reach O

## 2022-04-27 ENCOUNTER — Other Ambulatory Visit: Payer: Self-pay | Admitting: Internal Medicine

## 2022-04-29 DIAGNOSIS — H43813 Vitreous degeneration, bilateral: Secondary | ICD-10-CM | POA: Diagnosis not present

## 2022-04-29 DIAGNOSIS — H35033 Hypertensive retinopathy, bilateral: Secondary | ICD-10-CM | POA: Diagnosis not present

## 2022-04-29 DIAGNOSIS — H35371 Puckering of macula, right eye: Secondary | ICD-10-CM | POA: Diagnosis not present

## 2022-05-03 ENCOUNTER — Ambulatory Visit: Payer: PPO

## 2022-05-05 NOTE — Progress Notes (Signed)
05/06/22 9:10 AM   [name] 1936/08/17 [id number]  Referring provider:  [name], [address] [place],   [id number]  Urological history:  1. BPH with LU TS  -PSA 1.24 in 06/2021  -I PSS 15/2 -PVR 13 mL  -managed with tamsulosin 0.4 mg daily and finasteride 5 mg daily     2. Nocturia  -improved with use of CPAP     3. ED  -contributing factors of age, BPH, HTN and HLD  -not sexually active     4. Urinary retention  -post-op 03/2020   Follow-up and Benign Prostatic Hypertrophy    HPI: Brian Pearson is a 86 y.o.male who presents today for yearly visit.   Since his visit one year ago, he has repaired his CPAP machine and has had two episodes of hematospermia.  He was also having issues with dizziness that was contributed to vestibular issues and have resolved with PT.   He reports that his urinary frequency is associated with how much fluid intake he has. He reports urinary leakage whenever he has the urgency to urinate it happens half the time. His stream is weak and split. He also reports pain near his testicles he denies swelling in testicular area.   Patient denies any modifying or aggravating factors.  Patient denies any gross hematuria, dysuria or suprapubic/flank pain.  Patient denies any fevers, chills, nausea or vomiting.    He is still taking tamsulosin and finasteride.   PVR 13 mL    IPSS     Row Name 05/06/22 0800         International Prostate Symptom Score   How often have you had the sensation of not emptying your bladder? Not at All     How often have you had to urinate less than every two hours? More than half the time     How often have you found you stopped and started again several times when you urinated? Less than half the time     How often have you found it difficult to postpone urination? More than half the time     How often have you had a weak urinary stream? Less than half the time     How  often have you had to strain to start urination? Less than 1 in 5 times     How many times did you typically get up at night to urinate? 2 Times     Total IPSS Score 15       Quality of Life due to urinary symptoms   If you were to spend the rest of your life with your urinary condition just the way it is now how would you feel about that? Mostly Satisfied              Score:  1-7 Mild 8-19 Moderate 20-35 Severe   PMH: Past Medical History:  Diagnosis Date   Arthritis    BPH with obstruction/lower urinary tract symptoms    GERD (gastroesophageal reflux disease)    Hyperlipidemia    taken off simvastatin last year   Hypertension    Obesity    Pneumonia    hospitalized    Sleep apnea    uses cpap machine    Urinary frequency    Urine incontinence     Surgical History: Past Surgical History:  Procedure Laterality Date   COLONOSCOPY WITH PROPOFOL N/A 02/09/2016   Procedure: COLONOSCOPY WITH PROPOFOL;  Surgeon: Hulen Luster,  MD;  Location: [facility] ENDOSCOPY;  Service: Gastroenterology;  Laterality: N/A;   ESOPHAGOGASTRODUODENOSCOPY (EGD) WITH PROPOFOL N/A 02/09/2016   Procedure: ESOPHAGOGASTRODUODENOSCOPY (EGD) WITH PROPOFOL;  Surgeon: Hulen Luster, MD;  Location: [facility] ENDOSCOPY;  Service: Gastroenterology;  Laterality: N/A;   HERNIA REPAIR     1990's - 3 surgeries   KNEE ARTHROPLASTY Left 04/01/2021   Procedure: COMPUTER ASSISTED TOTAL KNEE ARTHROPLASTY;  Surgeon: Dereck Leep, MD;  Location: [facility] ORS;  Service: Orthopedics;  Laterality: Left;    Home Medications:  Allergies as of 05/06/2022   No Known Allergies      Medication List        Accurate as of May 06, 2022  9:10 AM. If you have any questions, ask your nurse or doctor.          amLODipine 10 MG tablet Commonly known as: NORVASC Take 1 tablet by mouth once daily   COQ10 PO Take 1 capsule by mouth daily.   Cyanocobalamin 5000 MCG Subl Place 5,000 mcg under the tongue daily.   finasteride 5 MG  tablet Commonly known as: PROSCAR Take 1 tablet by mouth once daily   FISH OIL PO Take 1 capsule by mouth daily.   losartan 50 MG tablet Commonly known as: COZAAR Take 1 tablet by mouth once daily   Multi-Vitamins Tabs Take 1 tablet by mouth daily.   omeprazole 20 MG capsule Commonly known as: PRILOSEC Take 1 capsule by mouth once daily   simvastatin 20 MG tablet Commonly known as: ZOCOR TAKE 1 TABLET BY MOUTH AT BEDTIME . APPOINTMENT REQUIRED FOR FUTURE REFILLS   tamsulosin 0.4 MG Caps capsule Commonly known as: FLOMAX Take 1 capsule by mouth once daily   Vitamin D-3 25 MCG (1000 UT) Caps Take 1,000 Units by mouth daily.        Allergies:  No Known Allergies  Family History: Family History  Problem Relation Age of Onset   Arthritis Mother    Heart disease Mother        CHF   Cancer Father        Prostate   Dementia Father    Alzheimer's disease Father    Hypotension Sister    Kidney cancer Neg Hx    Bladder Cancer Neg Hx     Social History:  reports that he has never smoked. He has never used smokeless tobacco. He reports current alcohol use. He reports that he does not use drugs.   Physical Exam: BP (!) 145/67   Pulse 83   Ht 6' (1.829 m)   Wt 225 lb (102.1 kg)   BMI 30.52 kg/m   Constitutional:  Alert and oriented, No acute distress. HEENT: [place] AT, moist mucus membranes.  Trachea midline Cardiovascular: No clubbing, cyanosis, or edema. Respiratory: Normal respiratory effort, no increased work of breathing. GU:  No CVA tenderness.  No bladder fullness or masses.  Patient with uncircumcised phallus. Foreskin easily retracted  Urethral meatus is patent.  No penile discharge. No penile lesions or rashes. Scrotum without lesions, cysts, rashes and/or edema.  Bilateral hydroceles, testicles are located scrotally bilaterally. No masses are appreciated in the testicles. Left and right epididymis are normal. Rectal: Patient with  normal sphincter tone. Anus  and perineum without scarring or rashes. No rectal masses are appreciated. Prostate is approximately 60+ grams, could only palpate the apex, no nodules are appreciated. Seminal vesicles could not be palpated Neurologic: Grossly intact, no focal deficits, moving all 4 extremities. Psychiatric: Normal mood and  affect.   Laboratory Data: Lab Results  Component Value Date   HGBA1C 5.8 02/02/2022   Component     Latest Ref Rng 02/02/2022  WBC     4.0 - 10.5 K/uL 4.9   RBC     4.22 - 5.81 Mil/uL 4.39   Hemoglobin     13.0 - 17.0 g/dL 13.5   HCT     39.0 - 52.0 % 41.1   MCV     78.0 - 100.0 fl 93.5   MCHC     30.0 - 36.0 g/dL 32.8   RDW     11.5 - 15.5 % 13.5   Platelets     150.0 - 400.0 K/uL 204.0   Neutrophils     43.0 - 77.0 % 58.0   Lymphocytes     12.0 - 46.0 % 27.5   Monocytes Relative     3.0 - 12.0 % 11.2   Eosinophil     0.0 - 5.0 % 2.9   Basophil     0.0 - 3.0 % 0.4   NEUT#     1.4 - 7.7 K/uL 2.9   Lymphocyte #     0.7 - 4.0 K/uL 1.4   Monocyte #     0.1 - 1.0 K/uL 0.6   Eosinophils Absolute     0.0 - 0.7 K/uL 0.1   Basophils Absolute     0.0 - 0.1 K/uL 0.0   WBC, UA     0 - 5 /hpf   Epithelial Cells (non renal)     0 - 10 /hpf   Mucus, UA     Not Estab.    Bacteria, UA     None seen/Few    MCH     26.0 - 34.0 pg   nRBC     0.0 - 0.2 %   Immature Granulocytes     %   Abs Immature Granulocytes     0.00 - 0.07 K/uL    Component     Latest Ref Rng 02/02/2022  Sodium     135 - 145 mEq/L 141   Potassium     3.5 - 5.1 mEq/L 3.9   Chloride     96 - 112 mEq/L 105   CO2     19 - 32 mEq/L 24   Glucose     70 - 99 mg/dL 97   BUN     6 - 23 mg/dL 18   Creatinine     0.40 - 1.50 mg/dL 0.84   Calcium     8.4 - 10.5 mg/dL 9.6   GFR     >60.00 mL/min 80.43   I have reviewed the labs.    Pertinent Imaging: Results for orders placed or performed in visit on 05/06/22  Bladder Scan (Post Void Residual) in office  Result Value Ref Range   Scan  Result 54m      Assessment & Plan:    1. BPH with LUTS -aged out of screening -DRE benign -PVR < 300 cc -symptoms - frequency, urge incontinence and weak stream -continue conservative management, avoiding bladder irritants and timed voiding's -continue tamsulosin 0.4 mg daily and finasteride 5 mg daily    Return in about 1 year (around 05/07/2023) for IPSS and PVR.  [address] [place] [phone] I, KEstill Dooms acting as a sEducation administratorfor [facility] PA-C.,have documented all relevant documentation on the behalf of [facility]  PA-C,as directed by  [facility], PA-C while in the presence of Malloree Raboin, PA-C.  I have reviewed the above documentation for accuracy and completeness, and I agree with the above.    Zara Council, PA-C

## 2022-05-06 ENCOUNTER — Ambulatory Visit: Payer: PPO | Admitting: Urology

## 2022-05-06 ENCOUNTER — Encounter: Payer: Self-pay | Admitting: Urology

## 2022-05-06 VITALS — BP 145/67 | HR 83 | Ht 72.0 in | Wt 225.0 lb

## 2022-05-06 DIAGNOSIS — N401 Enlarged prostate with lower urinary tract symptoms: Secondary | ICD-10-CM

## 2022-05-06 DIAGNOSIS — N138 Other obstructive and reflux uropathy: Secondary | ICD-10-CM

## 2022-05-06 LAB — BLADDER SCAN AMB NON-IMAGING

## 2022-05-07 ENCOUNTER — Ambulatory Visit (INDEPENDENT_AMBULATORY_CARE_PROVIDER_SITE_OTHER): Payer: PPO | Admitting: Internal Medicine

## 2022-05-07 ENCOUNTER — Encounter: Payer: Self-pay | Admitting: Internal Medicine

## 2022-05-07 VITALS — BP 132/78 | HR 68 | Temp 98.4°F | Resp 15 | Ht 70.0 in | Wt 225.2 lb

## 2022-05-07 DIAGNOSIS — I10 Essential (primary) hypertension: Secondary | ICD-10-CM | POA: Diagnosis not present

## 2022-05-07 DIAGNOSIS — G4733 Obstructive sleep apnea (adult) (pediatric): Secondary | ICD-10-CM | POA: Diagnosis not present

## 2022-05-07 DIAGNOSIS — G5600 Carpal tunnel syndrome, unspecified upper limb: Secondary | ICD-10-CM

## 2022-05-07 DIAGNOSIS — N400 Enlarged prostate without lower urinary tract symptoms: Secondary | ICD-10-CM

## 2022-05-07 DIAGNOSIS — M542 Cervicalgia: Secondary | ICD-10-CM | POA: Diagnosis not present

## 2022-05-07 DIAGNOSIS — D649 Anemia, unspecified: Secondary | ICD-10-CM | POA: Diagnosis not present

## 2022-05-07 DIAGNOSIS — R739 Hyperglycemia, unspecified: Secondary | ICD-10-CM | POA: Diagnosis not present

## 2022-05-07 DIAGNOSIS — E785 Hyperlipidemia, unspecified: Secondary | ICD-10-CM | POA: Diagnosis not present

## 2022-05-07 DIAGNOSIS — Z9989 Dependence on other enabling machines and devices: Secondary | ICD-10-CM | POA: Diagnosis not present

## 2022-05-07 NOTE — Progress Notes (Unsigned)
Patient ID: Brian Pearson, male   DOB: 1936-05-16, 86 y.o.   MRN: [id number]   Subjective:    Patient ID: Brian Pearson, male    DOB: 1936/07/20, 86 y.o.   MRN: [id number]  This visit occurred during the SARS-CoV-2 public health emergency.  Safety protocols were in place, including screening questions prior to the visit, additional usage of staff PPE, and extensive cleaning of exam room while observing appropriate contact time as indicated for disinfecting solutions.   Patient here for a scheduled follow up.   Chief Complaint  Patient presents with   Follow-up    Follow up for HTN   Anemia   Hypertension   .   HPI    Past Medical History:  Diagnosis Date   Arthritis    BPH with obstruction/lower urinary tract symptoms    GERD (gastroesophageal reflux disease)    Hyperlipidemia    taken off simvastatin last year   Hypertension    Obesity    Pneumonia    hospitalized    Sleep apnea    uses cpap machine    Urinary frequency    Urine incontinence    Past Surgical History:  Procedure Laterality Date   COLONOSCOPY WITH PROPOFOL N/A 02/09/2016   Procedure: COLONOSCOPY WITH PROPOFOL;  Surgeon: Hulen Luster, MD;  Location: [facility] ENDOSCOPY;  Service: Gastroenterology;  Laterality: N/A;   ESOPHAGOGASTRODUODENOSCOPY (EGD) WITH PROPOFOL N/A 02/09/2016   Procedure: ESOPHAGOGASTRODUODENOSCOPY (EGD) WITH PROPOFOL;  Surgeon: Hulen Luster, MD;  Location[facility] ENDOSCOPY;  Service: Gastroenterology;  Laterality: N/A;   HERNIA REPAIR     1990's - 3 surgeries   KNEE ARTHROPLASTY Left 04/01/2021   Procedure: COMPUTER ASSISTED TOTAL KNEE ARTHROPLASTY;  Surgeon: Dereck Leep, MD;  Location: [facility] ORS;  Service: Orthopedics;  Laterality: Left;   Family History  Problem Relation Age of Onset   Arthritis Mother    Heart disease Mother        CHF   Cancer Father        Prostate   Dementia Father    Alzheimer's disease Father    Hypotension Sister    Kidney cancer Neg Hx    Bladder Cancer  Neg Hx    Social History   Socioeconomic History   Marital status: Married    Spouse name: Not on file   Number of children: Not on file   Years of education: Not on file   Highest education level: Not on file  Occupational History   Not on file  Tobacco Use   Smoking status: Never   Smokeless tobacco: Never  Vaping Use   Vaping Use: Never used  Substance and Sexual Activity   Alcohol use: Yes    Alcohol/week: 0.0 standard drinks    Comment: rarely    Drug use: No   Sexual activity: Yes  Other Topics Concern   Not on file  Social History Narrative   Lives in [place].      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      [place] - regular      [place] in [employer], [place] Strain: Low Risk    Difficulty of Paying Living Expenses: Not hard at [place]: No [place]   Worried About Charity fundraiser in the  Last Year: Never true   Ran Out of Food in the Last Year: Never true  Transportation Needs: No Transportation Needs   Lack of Transportation (Medical): No   Lack of Transportation (Non-Medical): No  Physical Activity: Sufficiently Active   Days of Exercise per Week: 5 days   Minutes of Exercise per Session: 30 min  Stress: No Stress Concern Present   Feeling of Stress : Not at all  Social Connections: Moderately Integrated   Frequency of Communication with Friends and Family: Three times a week   Frequency of Social Gatherings with Friends and Family: More than three times a week   Attends Religious Services: 1 to 4 times per year   Active Member of [employer] or Organizations: No   Attends Music therapist: Never   Marital Status: Married     Review of Systems     Objective:     BP 132/78 (BP Location: Left Arm, Patient Position: Sitting, Cuff Size: Large)   Pulse 68   Temp 98.4 F (36.9 C)  (Temporal)   Resp 15   Ht '5\' 10"'$  (1.778 m)   Wt 225 lb 3.2 oz (102.2 kg)   SpO2 98%   BMI 32.31 kg/m  Wt Readings from Last 3 Encounters:  05/07/22 225 lb 3.2 oz (102.2 kg)  05/06/22 225 lb (102.1 kg)  03/24/22 224 lb 2 oz (101.7 kg)    Physical Exam   Outpatient Encounter Medications as of 05/07/2022  Medication Sig   amLODipine (NORVASC) 10 MG tablet Take 1 tablet by mouth once daily   Coenzyme Q10 (COQ10 PO) Take 1 capsule by mouth daily.   Cyanocobalamin 5000 MCG SUBL Place 5,000 mcg under the tongue daily.   finasteride (PROSCAR) 5 MG tablet Take 1 tablet by mouth once daily   losartan (COZAAR) 50 MG tablet Take 1 tablet by mouth once daily   Multiple Vitamin (MULTI-VITAMINS) TABS Take 1 tablet by mouth daily.   Omega-3 Fatty Acids (FISH OIL PO) Take 1 capsule by mouth daily.   omeprazole (PRILOSEC) 20 MG capsule Take 1 capsule by mouth once daily   simvastatin (ZOCOR) 20 MG tablet TAKE 1 TABLET BY MOUTH AT BEDTIME . APPOINTMENT REQUIRED FOR FUTURE REFILLS   tamsulosin (FLOMAX) 0.4 MG CAPS capsule Take 1 capsule by mouth once daily   [DISCONTINUED] Cholecalciferol (VITAMIN D-3) 1000 units CAPS Take 1,000 Units by mouth daily. (Patient not taking: Reported on 05/07/2022)   No facility-administered encounter medications on file as of 05/07/2022.     Lab Results  Component Value Date   WBC 4.9 02/02/2022   HGB 13.5 02/02/2022   HCT 41.1 02/02/2022   PLT 204.0 02/02/2022   GLUCOSE 97 02/02/2022   CHOL 159 11/06/2021   TRIG 106.0 11/06/2021   HDL 60.10 11/06/2021   LDLCALC 77 11/06/2021   ALT 15 02/02/2022   AST 18 02/02/2022   NA 141 02/02/2022   K 3.9 02/02/2022   CL 105 02/02/2022   CREATININE 0.84 02/02/2022   BUN 18 02/02/2022   CO2 24 02/02/2022   TSH 2.96 06/29/2021   PSA 1.24 06/26/2021   INR 1.0 03/23/2021   HGBA1C 5.8 02/02/2022   MICROALBUR 0.7 10/22/2014    MR Brain W Wo Contrast  Result Date: 11/26/2021 CLINICAL DATA:  Dizziness, non-specific.  Patient reports lightheadedness especially when looking up for several months. EXAM: MRI HEAD WITHOUT AND WITH CONTRAST TECHNIQUE: Multiplanar, multiecho pulse sequences of the brain and surrounding structures were obtained without and  with intravenous contrast. CONTRAST:  67m GADAVIST GADOBUTROL 1 MMOL/ML IV SOLN COMPARISON:  None. FINDINGS: Brain: There is no acute infarction or intracranial hemorrhage. There is no intracranial mass, mass effect, or edema. There is no hydrocephalus or extra-axial fluid collection. Prominence of the ventricles and sulci reflects mild parenchymal volume loss. Patchy foci of T2 hyperintensity in the supratentorial white matter are nonspecific but may reflect minor chronic microvascular ischemic changes. Incidental note is made of a small left cerebellar developmental venous anomaly. Vascular: Major vessel flow voids at the skull base are preserved. Skull and upper cervical spine: Normal marrow signal is preserved. Sinuses/Orbits: Paranasal sinuses are aerated. Orbits are unremarkable. Other: Sella is unremarkable.  Mastoid air cells are clear. IMPRESSION: No evidence of recent infarction, hemorrhage, or mass. No abnormal enhancement. Minor chronic microvascular ischemic changes. Electronically Signed   By: PMacy MisM.D.   On: 11/26/2021 11:23       Assessment & Plan:   Problem List Items Addressed This Visit     Anemia - Primary   Essential hypertension   Hyperlipidemia     CEinar Pheasant MD

## 2022-05-08 ENCOUNTER — Encounter: Payer: Self-pay | Admitting: Internal Medicine

## 2022-05-08 DIAGNOSIS — G5600 Carpal tunnel syndrome, unspecified upper limb: Secondary | ICD-10-CM | POA: Insufficient documentation

## 2022-05-08 DIAGNOSIS — R739 Hyperglycemia, unspecified: Secondary | ICD-10-CM | POA: Insufficient documentation

## 2022-05-08 NOTE — Assessment & Plan Note (Signed)
Diagnosed with severe sleep apnea.  Compliant with cpap.  Follow.

## 2022-05-08 NOTE — Assessment & Plan Note (Signed)
MRI C-Spine without contrast: 1. Severe spinal canal stenosis and right neural foraminal stenosis at C4-5. 2. Severe left C2-3 neural foraminal stenosis. 3. Moderate spinal canal stenosis at C6-7.  Better.  Follow.

## 2022-05-08 NOTE — Assessment & Plan Note (Signed)
Continue losartan and amlodipine.  Pressures as outlined.  Follow pressures.  Follow metabolic panel.

## 2022-05-08 NOTE — Assessment & Plan Note (Signed)
On simvastatin.  Low cholesterol diet and exercise.  Follow lipid panel and liver function tests.   

## 2022-05-08 NOTE — Assessment & Plan Note (Signed)
Has been followed by urology.  On tamsulosin.  Continue.  Stable.   

## 2022-05-08 NOTE — Assessment & Plan Note (Signed)
Follow cbc.  

## 2022-05-08 NOTE — Assessment & Plan Note (Signed)
Persistent.  Request referral back to Dr Rudene Christians for further evaluation.

## 2022-05-08 NOTE — Assessment & Plan Note (Signed)
Follow met b and a1c.  

## 2022-05-10 ENCOUNTER — Ambulatory Visit: Payer: PPO

## 2022-05-19 ENCOUNTER — Ambulatory Visit: Payer: PPO

## 2022-05-24 ENCOUNTER — Ambulatory Visit: Payer: PPO

## 2022-05-28 ENCOUNTER — Ambulatory Visit: Payer: PPO

## 2022-05-31 ENCOUNTER — Other Ambulatory Visit (INDEPENDENT_AMBULATORY_CARE_PROVIDER_SITE_OTHER): Payer: PPO

## 2022-05-31 DIAGNOSIS — E785 Hyperlipidemia, unspecified: Secondary | ICD-10-CM | POA: Diagnosis not present

## 2022-05-31 DIAGNOSIS — D649 Anemia, unspecified: Secondary | ICD-10-CM | POA: Diagnosis not present

## 2022-05-31 DIAGNOSIS — R739 Hyperglycemia, unspecified: Secondary | ICD-10-CM

## 2022-05-31 DIAGNOSIS — I10 Essential (primary) hypertension: Secondary | ICD-10-CM | POA: Diagnosis not present

## 2022-05-31 LAB — HEPATIC FUNCTION PANEL
ALT: 12 U/L (ref 0–53)
AST: 15 U/L (ref 0–37)
Albumin: 4.3 g/dL (ref 3.5–5.2)
Alkaline Phosphatase: 78 U/L (ref 39–117)
Bilirubin, Direct: 0.2 mg/dL (ref 0.0–0.3)
Total Bilirubin: 0.7 mg/dL (ref 0.2–1.2)
Total Protein: 6.4 g/dL (ref 6.0–8.3)

## 2022-05-31 LAB — BASIC METABOLIC PANEL
BUN: 14 mg/dL (ref 6–23)
CO2: 27 mEq/L (ref 19–32)
Calcium: 9.4 mg/dL (ref 8.4–10.5)
Chloride: 105 mEq/L (ref 96–112)
Creatinine, Ser: 0.91 mg/dL (ref 0.40–1.50)
GFR: 77.56 mL/min (ref >60.00)
Glucose, Bld: 97 mg/dL (ref 70–99)
Potassium: 4.1 mEq/L (ref 3.5–5.1)
Sodium: 140 mEq/L (ref 135–145)

## 2022-05-31 LAB — HEMOGLOBIN A1C: Hgb A1c MFr Bld: 5.9 % (ref 4.6–6.5)

## 2022-05-31 LAB — LIPID PANEL
Cholesterol: 152 mg/dL (ref 0–200)
HDL: 56.7 mg/dL (ref >39.00)
LDL Cholesterol: 66 mg/dL (ref 0–99)
NonHDL: 95.16
Total CHOL/HDL Ratio: 3
Triglycerides: 147 mg/dL (ref 0.0–149.0)
VLDL: 29.4 mg/dL (ref 0.0–40.0)

## 2022-05-31 LAB — CBC WITH DIFFERENTIAL/PLATELET
Basophils Absolute: 0 10*3/uL (ref 0.0–0.1)
Basophils Relative: 0.3 % (ref 0.0–3.0)
Eosinophils Absolute: 0.2 10*3/uL (ref 0.0–0.7)
Eosinophils Relative: 3 % (ref 0.0–5.0)
HCT: 40.1 % (ref 39.0–52.0)
Hemoglobin: 13.5 g/dL (ref 13.0–17.0)
Lymphocytes Relative: 29.2 % (ref 12.0–46.0)
Lymphs Abs: 1.6 10*3/uL (ref 0.7–4.0)
MCHC: 33.6 g/dL (ref 30.0–36.0)
MCV: 93 fl (ref 78.0–100.0)
Monocytes Absolute: 0.6 10*3/uL (ref 0.1–1.0)
Monocytes Relative: 10.8 % (ref 3.0–12.0)
Neutro Abs: 3 10*3/uL (ref 1.4–7.7)
Neutrophils Relative %: 56.7 % (ref 43.0–77.0)
Platelets: 201 10*3/uL (ref 150.0–400.0)
RBC: 4.31 Mil/uL (ref 4.22–5.81)
RDW: 13.6 % (ref 11.5–15.5)
WBC: 5.3 10*3/uL (ref 4.0–10.5)

## 2022-05-31 LAB — TSH: TSH: 3.79 u[IU]/mL (ref 0.35–5.50)

## 2022-06-01 ENCOUNTER — Ambulatory Visit: Payer: PPO

## 2022-06-01 ENCOUNTER — Other Ambulatory Visit: Payer: Self-pay | Admitting: Internal Medicine

## 2022-06-01 ENCOUNTER — Telehealth: Payer: Self-pay

## 2022-06-01 MED ORDER — LOSARTAN POTASSIUM 50 MG PO TABS: 50.0000 mg | ORAL_TABLET | Freq: Every day | ORAL | 1 refills | Status: DC

## 2022-06-01 NOTE — Telephone Encounter (Signed)
Pt requested rx changed to 90 day supply due to better copay

## 2022-06-02 DIAGNOSIS — G5603 Carpal tunnel syndrome, bilateral upper limbs: Secondary | ICD-10-CM | POA: Diagnosis not present

## 2022-06-03 DIAGNOSIS — M47812 Spondylosis without myelopathy or radiculopathy, cervical region: Secondary | ICD-10-CM | POA: Diagnosis not present

## 2022-06-03 DIAGNOSIS — R42 Dizziness and giddiness: Secondary | ICD-10-CM | POA: Diagnosis not present

## 2022-06-03 DIAGNOSIS — E673 Hypervitaminosis D: Secondary | ICD-10-CM | POA: Diagnosis not present

## 2022-06-03 DIAGNOSIS — G5603 Carpal tunnel syndrome, bilateral upper limbs: Secondary | ICD-10-CM | POA: Diagnosis not present

## 2022-06-03 DIAGNOSIS — I10 Essential (primary) hypertension: Secondary | ICD-10-CM | POA: Diagnosis not present

## 2022-06-03 DIAGNOSIS — R202 Paresthesia of skin: Secondary | ICD-10-CM | POA: Diagnosis not present

## 2022-06-10 ENCOUNTER — Ambulatory Visit: Payer: PPO

## 2022-06-16 ENCOUNTER — Ambulatory Visit: Payer: PPO

## 2022-06-17 DIAGNOSIS — G4733 Obstructive sleep apnea (adult) (pediatric): Secondary | ICD-10-CM | POA: Diagnosis not present

## 2022-06-23 ENCOUNTER — Ambulatory Visit: Payer: PPO

## 2022-06-28 ENCOUNTER — Ambulatory Visit: Payer: PPO

## 2022-07-03 ENCOUNTER — Other Ambulatory Visit: Payer: Self-pay | Admitting: Internal Medicine

## 2022-07-05 ENCOUNTER — Ambulatory Visit: Payer: PPO

## 2022-07-12 ENCOUNTER — Ambulatory Visit: Payer: PPO

## 2022-07-19 ENCOUNTER — Ambulatory Visit: Payer: PPO

## 2022-07-22 ENCOUNTER — Other Ambulatory Visit: Payer: Self-pay | Admitting: Urology

## 2022-07-22 DIAGNOSIS — N401 Enlarged prostate with lower urinary tract symptoms: Secondary | ICD-10-CM

## 2022-07-28 DIAGNOSIS — L270 Generalized skin eruption due to drugs and medicaments taken internally: Secondary | ICD-10-CM | POA: Diagnosis not present

## 2022-07-28 DIAGNOSIS — T50995A Adverse effect of other drugs, medicaments and biological substances, initial encounter: Secondary | ICD-10-CM | POA: Diagnosis not present

## 2022-08-11 ENCOUNTER — Other Ambulatory Visit: Payer: Self-pay | Admitting: Internal Medicine

## 2022-08-11 DIAGNOSIS — E785 Hyperlipidemia, unspecified: Secondary | ICD-10-CM

## 2022-08-23 ENCOUNTER — Other Ambulatory Visit: Payer: Self-pay | Admitting: Urology

## 2022-08-23 DIAGNOSIS — N401 Enlarged prostate with lower urinary tract symptoms: Secondary | ICD-10-CM

## 2022-08-25 DIAGNOSIS — G4733 Obstructive sleep apnea (adult) (pediatric): Secondary | ICD-10-CM | POA: Diagnosis not present

## 2022-09-15 ENCOUNTER — Ambulatory Visit (INDEPENDENT_AMBULATORY_CARE_PROVIDER_SITE_OTHER): Payer: PPO | Admitting: Internal Medicine

## 2022-09-15 ENCOUNTER — Encounter: Payer: Self-pay | Admitting: Internal Medicine

## 2022-09-15 VITALS — BP 138/72 | HR 75 | Temp 97.9°F | Ht 70.0 in | Wt 223.6 lb

## 2022-09-15 DIAGNOSIS — E785 Hyperlipidemia, unspecified: Secondary | ICD-10-CM | POA: Diagnosis not present

## 2022-09-15 DIAGNOSIS — Z23 Encounter for immunization: Secondary | ICD-10-CM | POA: Diagnosis not present

## 2022-09-15 DIAGNOSIS — R739 Hyperglycemia, unspecified: Secondary | ICD-10-CM

## 2022-09-15 DIAGNOSIS — G4733 Obstructive sleep apnea (adult) (pediatric): Secondary | ICD-10-CM | POA: Diagnosis not present

## 2022-09-15 DIAGNOSIS — D649 Anemia, unspecified: Secondary | ICD-10-CM

## 2022-09-15 DIAGNOSIS — R55 Syncope and collapse: Secondary | ICD-10-CM | POA: Diagnosis not present

## 2022-09-15 DIAGNOSIS — N400 Enlarged prostate without lower urinary tract symptoms: Secondary | ICD-10-CM | POA: Diagnosis not present

## 2022-09-15 DIAGNOSIS — I10 Essential (primary) hypertension: Secondary | ICD-10-CM | POA: Diagnosis not present

## 2022-09-15 DIAGNOSIS — Z0001 Encounter for general adult medical examination with abnormal findings: Secondary | ICD-10-CM

## 2022-09-15 DIAGNOSIS — R42 Dizziness and giddiness: Secondary | ICD-10-CM

## 2022-09-15 DIAGNOSIS — Z9989 Dependence on other enabling machines and devices: Secondary | ICD-10-CM

## 2022-09-15 DIAGNOSIS — Z0000 Encounter for general adult medical examination without abnormal findings: Secondary | ICD-10-CM

## 2022-09-15 DIAGNOSIS — R531 Weakness: Secondary | ICD-10-CM | POA: Diagnosis not present

## 2022-09-15 LAB — GLUCOSE, POCT (MANUAL RESULT ENTRY): POC Glucose: 108 mg/dl — AB (ref 70–99)

## 2022-09-15 LAB — HEPATIC FUNCTION PANEL
ALT: 12 U/L (ref 0–53)
AST: 16 U/L (ref 0–37)
Albumin: 4.5 g/dL (ref 3.5–5.2)
Alkaline Phosphatase: 83 U/L (ref 39–117)
Bilirubin, Direct: 0.1 mg/dL (ref 0.0–0.3)
Total Bilirubin: 0.8 mg/dL (ref 0.2–1.2)
Total Protein: 6.8 g/dL (ref 6.0–8.3)

## 2022-09-15 LAB — HEMOGLOBIN A1C: Hgb A1c MFr Bld: 6.1 % (ref 4.6–6.5)

## 2022-09-15 LAB — BASIC METABOLIC PANEL
BUN: 14 mg/dL (ref 6–23)
CO2: 26 mEq/L (ref 19–32)
Calcium: 9.3 mg/dL (ref 8.4–10.5)
Chloride: 106 mEq/L (ref 96–112)
Creatinine, Ser: 0.94 mg/dL (ref 0.40–1.50)
GFR: 74.45 mL/min (ref >60.00)
Glucose, Bld: 100 mg/dL — ABNORMAL HIGH (ref 70–99)
Potassium: 4.1 mEq/L (ref 3.5–5.1)
Sodium: 140 mEq/L (ref 135–145)

## 2022-09-15 LAB — LIPID PANEL
Cholesterol: 157 mg/dL (ref 0–200)
HDL: 55.4 mg/dL (ref >39.00)
LDL Cholesterol: 72 mg/dL (ref 0–99)
NonHDL: 101.99
Total CHOL/HDL Ratio: 3
Triglycerides: 149 mg/dL (ref 0.0–149.0)
VLDL: 29.8 mg/dL (ref 0.0–40.0)

## 2022-09-15 NOTE — Progress Notes (Signed)
Patient ID: STUART MIRABILE, male   DOB: 1936-02-22, 86 y.o.   MRN: [id number]   Subjective:    Patient ID: HIROYUKI OZANICH, male    DOB: [birth year], 86 y.o.   MRN: [id number]   Patient here for  Chief Complaint  Patient presents with   Follow-up    Physical    .   HPI Saw neurology 06/03/22 - dizziness.  Improved with vestibular rehab.  Also with CTS.  S/p release - Dr Rudene Christians   reports the dizziness is better.  He is having episodes - where when he stands - will feel weak.  States feels like everything "drains out" of him. This has been intermittent for the last couple of months.  No chest pain.  He is eating.  Discussed making sure he does not go long periods without eating.  Discussed importance of staying hydrated.  Not using cpap.  Discussed f/u with Dr Mortimer Fries.  No nausea or vomiting.  No abdominal pain.  Bowels moving.     Past Medical History:  Diagnosis Date   Arthritis    BPH with obstruction/lower urinary tract symptoms    GERD (gastroesophageal reflux disease)    Hyperlipidemia    taken off simvastatin last year   Hypertension    Obesity    Pneumonia    hospitalized    Sleep apnea    uses cpap machine    Urinary frequency    Urine incontinence    Past Surgical History:  Procedure Laterality Date   COLONOSCOPY WITH PROPOFOL N/A 02/09/2016   Procedure: COLONOSCOPY WITH PROPOFOL;  Surgeon: Hulen Luster, MD;  Location: [facility] ENDOSCOPY;  Service: Gastroenterology;  Laterality: N/A;   ESOPHAGOGASTRODUODENOSCOPY (EGD) WITH PROPOFOL N/A 02/09/2016   Procedure: ESOPHAGOGASTRODUODENOSCOPY (EGD) WITH PROPOFOL;  Surgeon: Hulen Luster, MD;  Location: [facility] ENDOSCOPY;  Service: Gastroenterology;  Laterality: N/A;   HERNIA REPAIR     1990's - 3 surgeries   KNEE ARTHROPLASTY Left 04/01/2021   Procedure: COMPUTER ASSISTED TOTAL KNEE ARTHROPLASTY;  Surgeon: Dereck Leep, MD;  Location: [facility] ORS;  Service: Orthopedics;  Laterality: Left;   Family History  Problem Relation Age of Onset    Arthritis Mother    Heart disease Mother        CHF   Cancer Father        Prostate   Dementia Father    Alzheimer's disease Father    Hypotension Sister    Kidney cancer Neg Hx    Bladder Cancer Neg Hx    Social History   Socioeconomic History   Marital status: Married    Spouse name: Not on file   Number of children: Not on file   Years of education: Not on file   Highest education level: Not on file  Occupational History   Not on file  Tobacco Use   Smoking status: Never   Smokeless tobacco: Never  Vaping Use   Vaping Use: Never used  Substance and Sexual Activity   Alcohol use: Yes    Alcohol/week: 0.0 standard drinks of alcohol    Comment: rarely    Drug use: No   Sexual activity: Yes  Other Topics Concern   Not on file  Social History Narrative   Lives in [place].      Work - retired, works part time driving for [employer] - golf in past      Exercise - stationary bike  Diet - regular      Served in [employer]   Social Determinants of Health   Financial Resource Strain: Low Risk  (10/08/2021)   Overall Financial Resource Strain (CARDIA)    Difficulty of Paying Living Expenses: Not hard at all  Food Insecurity: No Food Insecurity (10/08/2021)   Hunger Vital Sign    Worried About Running Out of Food in the Last Year: Never true    Ran Out of Food in the Last Year: Never true  Transportation Needs: No Transportation Needs (10/08/2021)   PRAPARE - Hydrologist (Medical): No    Lack of Transportation (Non-Medical): No  Physical Activity: Sufficiently Active (10/08/2021)   Exercise Vital Sign    Days of Exercise per Week: 5 days    Minutes of Exercise per Session: 30 min  Stress: No Stress Concern Present (10/08/2021)   [place]    Feeling of Stress : Not at all  Social Connections: Moderately Integrated (10/08/2021)    Social Connection and Isolation Panel [NHANES]    Frequency of Communication with Friends and Family: Three times a week    Frequency of Social Gatherings with Friends and Family: More than three times a week    Attends Religious Services: 1 to 4 times per year    Active Member of [employer] or Organizations: No    Attends Archivist Meetings: Never    Marital Status: Married     Review of Systems  Constitutional:  Negative for appetite change and unexpected weight change.  HENT:  Negative for congestion, sinus pressure and sore throat.   Eyes:  Negative for pain and visual disturbance.  Respiratory:  Negative for cough, chest tightness and shortness of breath.   Cardiovascular:  Negative for chest pain and palpitations.       No increased swelling.   Gastrointestinal:  Negative for abdominal pain, diarrhea, nausea and vomiting.  Genitourinary:  Negative for difficulty urinating and dysuria.  Musculoskeletal:  Negative for joint swelling and myalgias.  Skin:  Negative for color change and rash.  Neurological:  Negative for headaches.       Dizziness is better.   Hematological:  Negative for adenopathy. Does not bruise/bleed easily.  Psychiatric/Behavioral:  Negative for agitation and dysphoric mood.        Objective:     BP 138/72 (BP Location: Left Arm, Patient Position: Sitting, Cuff Size: Normal)   Pulse 75   Temp 97.9 F (36.6 C) (Oral)   Ht '5\' 10"'  (1.778 m)   Wt 223 lb 9.6 oz (101.4 kg)   SpO2 99%   BMI 32.08 kg/m  Wt Readings from Last 3 Encounters:  09/15/22 223 lb 9.6 oz (101.4 kg)  05/07/22 225 lb 3.2 oz (102.2 kg)  05/06/22 225 lb (102.1 kg)    Physical Exam Constitutional:      General: He is not in acute distress.    Appearance: Normal appearance. He is well-developed.  HENT:     Head: Normocephalic and atraumatic.     Right Ear: External ear normal.     Left Ear: External ear normal.  Eyes:     General: No scleral icterus.       Right eye: No  discharge.        Left eye: No discharge.     Conjunctiva/sclera: Conjunctivae normal.  Neck:     Thyroid: No thyromegaly.  Cardiovascular:  Rate and Rhythm: Normal rate and regular rhythm.  Pulmonary:     Effort: No respiratory distress.     Breath sounds: Normal breath sounds. No wheezing.  Abdominal:     General: Bowel sounds are normal.     Palpations: Abdomen is soft.     Tenderness: There is no abdominal tenderness.  Musculoskeletal:        General: No swelling or tenderness.     Cervical back: Neck supple. No tenderness.  Lymphadenopathy:     Cervical: No cervical adenopathy.  Skin:    Findings: No erythema or rash.  Neurological:     Mental Status: He is alert and oriented to person, place, and time.  Psychiatric:        Mood and Affect: Mood normal.        Behavior: Behavior normal.      Outpatient Encounter Medications as of 09/15/2022  Medication Sig   amLODipine (NORVASC) 10 MG tablet Take 1 tablet by mouth once daily   Coenzyme Q10 (COQ10 PO) Take 1 capsule by mouth daily.   Cyanocobalamin 5000 MCG SUBL Place 5,000 mcg under the tongue daily.   finasteride (PROSCAR) 5 MG tablet Take 1 tablet by mouth once daily   losartan (COZAAR) 50 MG tablet Take 1 tablet (50 mg total) by mouth daily.   Multiple Vitamin (MULTI-VITAMINS) TABS Take 1 tablet by mouth daily.   Omega-3 Fatty Acids (FISH OIL PO) Take 1 capsule by mouth daily.   omeprazole (PRILOSEC) 20 MG capsule Take 1 capsule by mouth once daily   simvastatin (ZOCOR) 20 MG tablet TAKE ONE TABLET BY MOUTH AT BEDTIME. APPOINTMENT REQUIRED FOR FUTURE REFILLS   tamsulosin (FLOMAX) 0.4 MG CAPS capsule Take 1 capsule by mouth once daily   No facility-administered encounter medications on file as of 09/15/2022.     Lab Results  Component Value Date   WBC 5.3 05/31/2022   HGB 13.5 05/31/2022   HCT 40.1 05/31/2022   PLT 201.0 05/31/2022   GLUCOSE 100 (H) 09/15/2022   CHOL 157 09/15/2022   TRIG 149.0 09/15/2022    HDL 55.40 09/15/2022   LDLCALC 72 09/15/2022   ALT 12 09/15/2022   AST 16 09/15/2022   NA 140 09/15/2022   K 4.1 09/15/2022   CL 106 09/15/2022   CREATININE 0.94 09/15/2022   BUN 14 09/15/2022   CO2 26 09/15/2022   TSH 3.79 05/31/2022   PSA 1.24 06/26/2021   INR 1.0 03/23/2021   HGBA1C 6.1 09/15/2022   MICROALBUR 0.7 10/22/2014    MR Brain W Wo Contrast  Result Date: 11/26/2021 CLINICAL DATA:  Dizziness, non-specific. Patient reports lightheadedness especially when looking up for several months. EXAM: MRI HEAD WITHOUT AND WITH CONTRAST TECHNIQUE: Multiplanar, multiecho pulse sequences of the brain and surrounding structures were obtained without and with intravenous contrast. CONTRAST:  49m GADAVIST GADOBUTROL 1 MMOL/ML IV SOLN COMPARISON:  None. FINDINGS: Brain: There is no acute infarction or intracranial hemorrhage. There is no intracranial mass, mass effect, or edema. There is no hydrocephalus or extra-axial fluid collection. Prominence of the ventricles and sulci reflects mild parenchymal volume loss. Patchy foci of T2 hyperintensity in the supratentorial white matter are nonspecific but may reflect minor chronic microvascular ischemic changes. Incidental note is made of a small left cerebellar developmental venous anomaly. Vascular: Major vessel flow voids at the skull base are preserved. Skull and upper cervical spine: Normal marrow signal is preserved. Sinuses/Orbits: Paranasal sinuses are aerated. Orbits are unremarkable. Other: Sella is  unremarkable.  Mastoid air cells are clear. IMPRESSION: No evidence of recent infarction, hemorrhage, or mass. No abnormal enhancement. Minor chronic microvascular ischemic changes. Electronically Signed   By: Macy Mis M.D.   On: 11/26/2021 11:23       Assessment & Plan:   Problem List Items Addressed This Visit     Anemia    Recheck cbc.       Benign prostatic hyperplasia without urinary obstruction    Has been followed by urology.   On tamsulosin.  Continue.  Stable.        Essential hypertension    Continue losartan and amlodipine.  Pressures as outlined. Not orthostatic on exam.  Follow pressures.  Follow metabolic panel.       Relevant Orders   Basic metabolic panel (Completed)   Health care maintenance    Physical today 09/15/22.  Colonoscopy 01/2016 - normal.  PSA - 06/26/21 - 1.24.       Hyperglycemia - Primary    Follow met b and a1c.       Relevant Orders   Hemoglobin A1c (Completed)   Hyperlipidemia    On simvastatin.  Low cholesterol diet and exercise.  Follow lipid panel and liver function tests.        Relevant Orders   Hepatic function panel (Completed)   Lipid panel (Completed)   Light headedness    Had extensive w/up previously.  Saw neurology.  MRI as outlined previously.  Vestibular rehab.  Symptoms improved.        Near syncope    The previous dizziness he was experiencing is better after vestibular rehab. He is having "weak spells" - over the last couple of months.  Occurs when he is standing.  States feels like everything is draining out of him.  Has to sit down.  No chest pain.  EKG - SR,  RBBB.  Not orthostatic on exam.  Check routine labs to confirm no change  - anemia, etc.  Stay hydrated.  Make sure to eat regular meals.  Compression hose.  Will also arrange f/u with cardiology to confirm if any further cardiac w/up, for example zio monitor, etc is warranted - especially given these are occurring more frequently.        OSA on CPAP    Not using cpap now.  Discussed importance of using. Needs f/u with Dr Mortimer Fries.        Weakness   Relevant Orders   EKG 12-Lead (Completed)   POCT Glucose (CBG) (Completed)   Other Visit Diagnoses     Need for immunization against influenza       Relevant Orders   Flu Vaccine QUAD High Dose(Fluad) (Completed)        [name], MD

## 2022-09-15 NOTE — Assessment & Plan Note (Signed)
Physical today 09/15/22.  Colonoscopy 01/2016 - normal.  PSA - 06/26/21 - 1.24.

## 2022-09-19 ENCOUNTER — Telehealth: Payer: Self-pay | Admitting: Internal Medicine

## 2022-09-19 ENCOUNTER — Encounter: Payer: Self-pay | Admitting: Internal Medicine

## 2022-09-19 NOTE — Telephone Encounter (Signed)
Brian Pearson has seen cardiology - Dr Rockey Situ 2.2023.  do not need to place order for new referral.  He needs a f/u with cardiology regarding near syncopal episodes.  Please call and schedule an appt.  Thanks.

## 2022-09-19 NOTE — Assessment & Plan Note (Signed)
The previous dizziness he was experiencing is better after vestibular rehab. He is having "weak spells" - over the last couple of months.  Occurs when he is standing.  States feels like everything is draining out of him.  Has to sit down.  No chest pain.  EKG - SR,  RBBB.  Not orthostatic on exam.  Check routine labs to confirm no change  - anemia, etc.  Stay hydrated.  Make sure to eat regular meals.  Compression hose.  Will also arrange f/u with cardiology to confirm if any further cardiac w/up, for example zio monitor, etc is warranted - especially given these are occurring more frequently.

## 2022-09-19 NOTE — Assessment & Plan Note (Signed)
Has been followed by urology.  On tamsulosin.  Continue.  Stable.

## 2022-09-19 NOTE — Assessment & Plan Note (Signed)
Continue losartan and amlodipine.  Pressures as outlined. Not orthostatic on exam.  Follow pressures.  Follow metabolic panel.

## 2022-09-19 NOTE — Assessment & Plan Note (Signed)
Follow met b and a1c.  

## 2022-09-19 NOTE — Assessment & Plan Note (Signed)
Had extensive w/up previously.  Saw neurology.  MRI as outlined previously.  Vestibular rehab.  Symptoms improved.

## 2022-09-19 NOTE — Assessment & Plan Note (Signed)
Recheck cbc.  

## 2022-09-19 NOTE — Assessment & Plan Note (Addendum)
Not using cpap now.  Discussed importance of using. Needs f/u with Dr Mortimer Fries.

## 2022-09-19 NOTE — Assessment & Plan Note (Signed)
On simvastatin.  Low cholesterol diet and exercise.  Follow lipid panel and liver function tests.   

## 2022-09-20 NOTE — Telephone Encounter (Signed)
Pt notified that appointment was made for Thursday with Murray Hodgkins, PA Thursday 09/23/22 ar 1:30p.

## 2022-09-20 NOTE — Addendum Note (Signed)
Addended by: Alisa Graff on: 09/20/2022 05:10 PM   Modules accepted: Level of Service

## 2022-09-22 NOTE — Progress Notes (Unsigned)
Cardiology Office Note:    Date:  09/23/2022   ID:  Brian Pearson, DOB 22-Aug-1936, MRN [id number]  PCP:  [name], [place] Providers Cardiologist:  Ida Rogue, MD     Referring MD: [name], MD   Chief complaint: Dizziness and low energy x several months  History of Present Illness:    Brian Pearson is a 86 y.o. male with a hx of the following:  HTN HLD Near syncope OSA on CPAP Vertigo   Initially evaluated by cardiology in April 2023 by Dr. Rockey Situ.  Last office visit was for preoperative cardiovascular examination.  Was referred by primary care provider for dizziness and right bundle branch block.  Was scheduled to have carpal tunnel release was told to cancel due to abnormal EKG.  Has history of vertigo.  He stated in office that day that when he stands he feels like seeing he is going to pass out, more dizzy with head movement.  Dizziness started 1 year ago. Has been seen by neurology.  He has worked with PT due to history of vertigo to help with his dizziness.  Denied any falls.  MRI of the head did not reveal anything acute, minor chronic microvascular ischemic changes.  MRI of neck revealed spinal stenosis along cervical spine in 2020.  Has had a chronic right bundle branch block dating back to 2015, unchanged on EKG that day.  Dr. Rockey Situ found he was asymptomatic with the right bundle branch block, denied any chest pain.  Denied any syncope or near syncope.  He stated he was at acceptable risk for surgery, no further cardiac testing was needed at that time.  Orthostatics were negative.  BP overall well controlled.  Carotid Doppler in 2015 revealed mild bilateral plaque without any significant stenosis.  Today he presents for evaluation of dizziness with his wife.  He states for the past several months he has had low energy and when standing feels weak, states "it feels like I am completely drained."  Recently had orthostatics done at PCP  office and they were negative.  Denies any palpitations or fast heart rates.  Denies syncope or LOC.  States when he has these episodes he knows he has to sit down or else he will probably pass out.  Also denies any chest pain, shortness of breath, orthopnea, PND, or claudication.  He does wear his CPAP every night.  Blood pressure well controlled at home.  Wife does admit that he has chronic ankle edema, right greater than left.  States he has not been wearing compression stockings.  States he has seen PT for vertigo, but would like further cardiovascular work-up to see what is causing this.  Denies any other questions or concerns today.  Past Medical History:  Diagnosis Date   Arthritis    BPH with obstruction/lower urinary tract symptoms    GERD (gastroesophageal reflux disease)    Hyperlipidemia    taken off simvastatin last year   Hypertension    Obesity    Pneumonia    hospitalized    Sleep apnea    uses cpap machine    Urinary frequency    Urine incontinence     Past Surgical History:  Procedure Laterality Date   COLONOSCOPY WITH PROPOFOL N/A 02/09/2016   Procedure: COLONOSCOPY WITH PROPOFOL;  Surgeon: Hulen Luster, MD;  Location: [facility] ENDOSCOPY;  Service: Gastroenterology;  Laterality: N/A;   ESOPHAGOGASTRODUODENOSCOPY (EGD) WITH PROPOFOL N/A 02/09/2016   Procedure: ESOPHAGOGASTRODUODENOSCOPY (  EGD) WITH PROPOFOL;  Surgeon: Hulen Luster, MD;  Location: [facility] ENDOSCOPY;  Service: Gastroenterology;  Laterality: N/A;   HERNIA REPAIR     1990's - 3 surgeries   KNEE ARTHROPLASTY Left 04/01/2021   Procedure: COMPUTER ASSISTED TOTAL KNEE ARTHROPLASTY;  Surgeon: Dereck Leep, MD;  Location: [facility] ORS;  Service: Orthopedics;  Laterality: Left;    Current Medications: Current Meds  Medication Sig   amLODipine (NORVASC) 10 MG tablet Take 1 tablet by mouth once daily   Coenzyme Q10 (COQ10 PO) Take 1 capsule by mouth daily.   Cyanocobalamin 5000 MCG SUBL Place 5,000 mcg under the tongue daily.    finasteride (PROSCAR) 5 MG tablet Take 1 tablet by mouth once daily   losartan (COZAAR) 50 MG tablet Take 1 tablet (50 mg total) by mouth daily.   Multiple Vitamin (MULTI-VITAMINS) TABS Take 1 tablet by mouth daily.   Omega-3 Fatty Acids (FISH OIL PO) Take 1 capsule by mouth daily.   omeprazole (PRILOSEC) 20 MG capsule Take 1 capsule by mouth once daily   simvastatin (ZOCOR) 20 MG tablet TAKE ONE TABLET BY MOUTH AT BEDTIME. APPOINTMENT REQUIRED FOR FUTURE REFILLS   tamsulosin (FLOMAX) 0.4 MG CAPS capsule Take 1 capsule by mouth once daily     Allergies:   Oxycodone and Amoxicillin   Social History   Socioeconomic History   Marital status: Married    Spouse name: Not on file   Number of children: Not on file   Years of education: Not on file   Highest education level: Not on file  Occupational History   Not on file  Tobacco Use   Smoking status: Never   Smokeless tobacco: Never  Vaping Use   Vaping Use: Never used  Substance and Sexual Activity   Alcohol use: Yes    Alcohol/week: 0.0 standard drinks of alcohol    Comment: rarely    Drug use: No   Sexual activity: Yes  Other Topics Concern   Not on file  Social History Narrative   Lives in [place].      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      [place] - regular      [place] in [place], [place] Strain: [place]  (10/08/2021)   Overall Financial Resource Strain ([place])    Difficulty of Paying Living Expenses: Not hard at all  Food Insecurity: No Food Insecurity (10/08/2021)   Hunger Vital Sign    Worried About Running Out of Food in the Last Year: Never true    [place] in the Last Year: Never true  Transportation Needs: No Transportation Needs (10/08/2021)   PRAPARE - Hydrologist (Medical): No    Lack of Transportation (Non-Medical): No   Physical Activity: Sufficiently Active (10/08/2021)   Exercise Vital Sign    Days of Exercise per Week: 5 days    Minutes of Exercise per Session: 30 min  Stress: No Stress Concern Present (10/08/2021)   [place]    Feeling of Stress : Not at all  Social Connections: Moderately Integrated (10/08/2021)   Social Connection and Isolation Panel [NHANES]    Frequency of Communication with Friends and Family: Three times a week    Frequency of  Social Gatherings with Friends and Family: More than three times a week    Attends Religious Services: 1 to 4 times per year    Active Member of [employer] or Organizations: No    Attends Music therapist: Never    Marital Status: Married     Family History: The patient's family history includes Alzheimer's disease in his father; Arthritis in his mother; Cancer in his father; Dementia in his father; Heart disease in his mother; Hypotension in his sister. There is no history of Kidney cancer or Bladder Cancer.    ROS:   Review of Systems  Constitutional:  Positive for malaise/fatigue. Negative for chills, diaphoresis, fever and weight loss.  HENT: Negative.    Eyes: Negative.   Respiratory: Negative.    Cardiovascular:  Positive for leg swelling. Negative for chest pain, palpitations, orthopnea, claudication and PND.       Chronic bilateral ankle edema.  See HPI.  Gastrointestinal: Negative.   Genitourinary: Negative.   Musculoskeletal: Negative.   Skin: Negative.   Neurological:  Positive for dizziness. Negative for tingling, tremors, sensory change, speech change, focal weakness, seizures, loss of consciousness, weakness and headaches.       Feels lightheaded with these episodes.  See HPI.  Endo/Heme/Allergies: Negative.   Psychiatric/Behavioral: Negative.      Please see the history of present illness.    All other systems reviewed and are  negative.  EKGs/Labs/Other Studies Reviewed:    The following studies were reviewed today:   EKG:  EKG is ordered today.  The ekg ordered today demonstrates normal sinus rhythm, 66 bpm, occasional PVCs, RBBB, nonspecific ST segment changes seen on previous EKGs, otherwise nothing acute.   Venous Doppler bilateral on July 27, 2019: No femoropopliteal and no calf DVT in the visualized calf veins. If clinical symptoms are inconsistent or if there are persistent or worsening symptoms, further imaging (possibly involving the iliac veins) may be warranted.    Bilateral Carotid Doppler on 10/24/2014: Mild bilateral plaque without focal hemodynamically significant stenosis.   Recent Labs: 05/31/2022: TSH 3.79 09/15/2022: ALT 12; BUN 14; Creatinine, Ser 0.94; Potassium 4.1; Sodium 140 09/23/2022: Hemoglobin 13.5; Magnesium 2.0; Platelets 213  Recent Lipid Panel    Component Value Date/Time   CHOL 157 09/15/2022 0944   TRIG 149.0 09/15/2022 0944   HDL 55.40 09/15/2022 0944   CHOLHDL 3 09/15/2022 0944   VLDL 29.8 09/15/2022 0944   LDLCALC 72 09/15/2022 0944     Risk Assessment/Calculations:      HYPERTENSION CONTROL Vitals:   09/23/22 1336 09/23/22 1340  BP: (!) 154/64 (!) 140/62    The patient's blood pressure is elevated above target today.  In order to address the patient's elevated BP: Blood pressure will be monitored at home to determine if medication changes need to be made.; The blood pressure is usually elevated in clinic.  Blood pressures monitored at home have been optimal.      Physical Exam:    VS:  BP (!) 140/62 (BP Location: Left Arm, Patient Position: Sitting, Cuff Size: Normal)   Pulse 66   Ht '6\' 1"'$  (1.854 m)   Wt 224 lb 12.8 oz (102 kg)   SpO2 98%   BMI 29.66 kg/m     Wt Readings from Last 3 Encounters:  09/23/22 224 lb 12.8 oz (102 kg)  09/15/22 223 lb 9.6 oz (101.4 kg)  05/07/22 225 lb 3.2 oz (102.2 kg)     GEN: Well nourished, well developed  86 year old Caucasian male in no acute distress HEENT: Normal NECK: No JVD; No carotid bruits CARDIAC: S1/S2, RRR, no murmurs, rubs, gallops; 2+ radial pulses, strong and equal bilaterally.  1+ PT pulses, equal bilaterally. RESPIRATORY:  Clear and diminished to auscultation without rales, wheezing or rhonchi  MUSCULOSKELETAL: Dependent, nonpitting, +2 ankle edema bilaterally, right minimally greater than left.  Otherwise nothing acute. No deformity  SKIN: Thin skin, warm and dry NEUROLOGIC:  Alert and oriented x 3 PSYCHIATRIC:  Normal affect   ASSESSMENT:    1. Dizziness   2. Light headedness   3. Low energy   4. Hypertension, unspecified type   5. Hyperlipidemia, unspecified hyperlipidemia type   6. Carotid artery disease, unspecified laterality, unspecified type ([place])   7. Ankle edema   8. OSA on CPAP   9. Vertigo    PLAN:    In order of problems listed above:  Dizziness/lightheadedness Denies any syncope, falls, or LOC.  EKG today revealed RBBB with occasional PVCs.  Denies any tachycardia or palpitations.  Recent negative orthostatics at PCP.  We will arrange 14-day ZIO monitor and echocardiogram, as well as carotid Doppler.  We will obtain the following blood work today CBC and magnesium.  No medication changes at this time.  Conservative measures discussed including changing positions slowly, adequate hydration, and wearing compression stockings.  ED precautions discussed.   2.  Low energy This has been chronic and associated symptom along with dizziness/lightheadedness.  Will obtain CBC today and arrange echocardiogram.  3.  Hypertension BP on arrival 154/64.  Repeat BP 140/62.  Does admit to whitecoat hypertension.  BP overall well controlled at home. Discussed to monitor BP at home at least 2 hours after medications and sitting for 5-10 minutes.  No medication changes at this time.  Given BP log today in office.  Heart healthy low-salt diet encouraged.  4.   Hyperlipidemia LDL in June 2023 was 66, total cholesterol 152.  Continue coenzyme every 10, omega-3 fatty acids, and simvastatin.  5.  Carotid artery disease Carotid Doppler and 2015 revealed mild bilateral plaque without focal hemodynamically significant stenosis.  We will update carotid Doppler at this time.  Continue current medication regimen as mentioned above.  6.  Ankle edema Wife reports this has been chronic.  Right does appear minimally greater than left.  Wife states this is stable for him.  Discussed low-salt diet, leg elevation, wearing compression stockings, and fluid intake less than 2 L/day.  We will arrange 2D echocardiogram to assess overall LV function.  7.  OSA on CPAP Encouraged continued compliance.   8. Vertigo Continue PT regimen and Epley maneuvers.  9.  Disposition: Follow up with Ignacia Bayley, NP or APP in 6 to 8 weeks or sooner if anything changes     Medication Adjustments/Labs and Tests Ordered: Current medicines are reviewed at length with the patient today.  Concerns regarding medicines are outlined above.  Orders Placed This Encounter  Procedures   Magnesium   CBC   LONG TERM MONITOR (3-14 DAYS)   EKG 12-Lead   ECHOCARDIOGRAM COMPLETE   VAS US CAROTID   No orders of the defined types were placed in this encounter.   Patient Instructions  Medication Instructions:  Your physician recommends that you continue on your current medications as directed. Please refer to the Current Medication list given to you today.  *If you need a refill on your cardiac medications before your next appointment, please call your pharmacy*   Lab Work: CBC  and Mag today If you have labs (blood work) drawn today and your tests are completely normal, you will receive your results only by: [place] (if you have MyChart) OR A paper copy in the mail If you have any lab test that is abnormal or we need to change your treatment, we will call you to review the  results.   Testing/Procedures: Your physician has requested that you have a carotid duplex. This test is an ultrasound of the carotid arteries in your neck. It looks at blood flow through these arteries that supply the brain with blood. Allow one hour for this exam. There are no restrictions or special instructions.   Your physician has requested that you have an echocardiogram. Echocardiography is a painless test that uses sound waves to create images of your heart. It provides your doctor with information about the size and shape of your heart and how well your heart's chambers and valves are working. This procedure takes approximately one hour. There are no restrictions for this procedure.   ZIO XT- Long Term Monitor Instructions  Your physician has requested you wear a ZIO patch monitor for 14 days. Do not apply until after echocardiogram is complete. This is a single patch monitor. [employer] supplies one patch monitor per enrollment. Additional stickers are not available. Please do not apply patch if you will be having a Nuclear Stress Test,  Echocardiogram, Cardiac CT, MRI, or Chest Xray during the period you would be wearing the  monitor. The patch cannot be worn during these tests. You cannot remove and re-apply the  ZIO XT patch monitor.  Your ZIO patch monitor will be mailed 3 day USPS to your address on file. It may take 3-5 days  to receive your monitor after you have been enrolled.  Once you have received your monitor, please review the enclosed instructions. Your monitor  has already been registered assigning a specific monitor serial # to you.  Billing and Patient Assistance Program Information  We have supplied [employer] with any of your insurance information on file for billing purposes. [employer] offers a sliding scale Patient Assistance Program for patients that do not have  insurance, or whose insurance does not completely cover the cost of the ZIO monitor.  You must apply for  the Patient Assistance Program to qualify for this discounted rate.  To apply, please call [employer] at [phone], select option 4, select option 2, ask to apply for  Patient Assistance Program. Theodore Demark will ask your household income, and how many people  are in your household. They will quote your out-of-pocket cost based on that information.  [employer] will also be able to set up a 18-month interest-free payment plan if needed.  Applying the monitor   Shave hair from upper left chest.  Hold abrader disc by orange tab. Rub abrader in 40 strokes over the upper left chest as  indicated in your monitor instructions.  Clean area with 4 enclosed alcohol pads. Let dry.  Apply patch as indicated in monitor instructions. Patch will be placed under collarbone on left  side of chest with arrow pointing upward.  Rub patch adhesive wings for 2 minutes. Remove white label marked "1". Remove the white  label marked "2". Rub patch adhesive wings for 2 additional minutes.  While looking in a mirror, press and release button in center of patch. A small green light will  flash 3-4 times. This will be your only indicator that the monitor has been turned on.  Do not shower for the first 24 hours. You may shower after the first 24 hours.  Press the button if you feel a symptom. You will hear a small click. Record Date, Time and  Symptom in the Patient Logbook.  When you are ready to remove the patch, follow instructions on the last 2 pages of Patient  Logbook. Stick patch monitor onto the last page of Patient Logbook.  Place Patient Logbook in the blue and white box. Use locking tab on box and tape box closed  securely. The blue and white box has prepaid postage on it. Please place it in the mailbox as  soon as possible. Your physician should have your test results approximately 7 days after the  monitor has been mailed back to [facility].  Call [place] at [phone] if you have  questions regarding  your ZIO XT patch monitor. Call them immediately if you see an orange light blinking on your  monitor.  If your monitor falls off in less than 4 days, contact our Monitor department at [phone].  If your monitor becomes loose or falls off after 4 days call [employer] at [phone] for  suggestions on securing your monitor    Follow-Up: At [facility], you and your health needs are our priority.  As part of our continuing mission to provide you with exceptional heart care, we have created designated Provider Care Teams.  These Care Teams include your primary Cardiologist (physician) and Advanced Practice Providers (APPs -  Physician Assistants and Nurse Practitioners) who all work together to provide you with the care you need, when you need it.  We recommend signing up for the patient portal called "MyChart".  Sign up information is provided on this After Visit Summary.  MyChart is used to connect with patients for Virtual Visits (Telemedicine).  Patients are able to view lab/test results, encounter notes, upcoming appointments, etc.  Non-urgent messages can be sent to your provider as well.   To learn more about what you can do with MyChart, go to [email or web address].    Your next appointment:   6-8 week(s)  The format for your next appointment:   In Person  Provider:   You may see Ida Rogue, MD or one of the following Advanced Practice Providers on your designated Care Team:   Murray Hodgkins, NP Christell Faith, PA-C Cadence Kathlen Mody, PA-C Gerrie Nordmann, NP   Other Instructions 1.Try to wear compression stockings    Low-Sodium Eating Plan Sodium, which is an element that makes up salt, helps you maintain a healthy balance of fluids in your body. Too much sodium can increase your blood pressure and cause fluid and waste to be held in your body. Your health care provider or dietitian may recommend following this plan if you have high blood  pressure (hypertension), kidney disease, liver disease, or heart failure. Eating less sodium can help lower your blood pressure, reduce swelling, and protect your heart, liver, and kidneys. What are tips for following this plan? Reading food labels The Nutrition Facts label lists the amount of sodium in one serving of the food. If you eat more than one serving, you must multiply the listed amount of sodium by the number of servings. Choose foods with less than 140 mg of sodium per serving. Avoid foods with 300 mg of sodium or more per serving. Shopping  Look for lower-sodium products, often labeled as "low-sodium" or "no salt added." Always check the sodium content, even if foods are  labeled as "unsalted" or "no salt added." Buy fresh foods. Avoid canned foods and pre-made or frozen meals. Avoid canned, cured, or processed meats. Buy breads that have less than 80 mg of sodium per slice. Cooking  Eat more home-cooked food and less restaurant, buffet, and fast food. Avoid adding salt when cooking. Use salt-free seasonings or herbs instead of table salt or sea salt. Check with your health care provider or pharmacist before using salt substitutes. Cook with plant-based oils, such as canola, sunflower, or olive oil. Meal planning When eating at a restaurant, ask that your food be prepared with less salt or no salt, if possible. Avoid dishes labeled as brined, pickled, cured, smoked, or made with soy sauce, miso, or teriyaki sauce. Avoid foods that contain MSG (monosodium glutamate). MSG is sometimes added to [place] food, bouillon, and some canned foods. Make meals that can be grilled, baked, poached, roasted, or steamed. These are generally made with less sodium. General information Most people on this plan should limit their sodium intake to 1,500-2,000 mg (milligrams) of sodium each day. What foods should I eat? Fruits Fresh, frozen, or canned fruit. Fruit juice. Vegetables Fresh or frozen  vegetables. "No salt added" canned vegetables. "No salt added" tomato sauce and paste. Low-sodium or reduced-sodium tomato and vegetable juice. Grains Low-sodium cereals, including oats, puffed wheat and rice, and shredded wheat. Low-sodium crackers. Unsalted rice. Unsalted pasta. Low-sodium bread. Whole-grain breads and whole-grain pasta. Meats and other proteins Fresh or frozen (no salt added) meat, poultry, seafood, and fish. Low-sodium canned tuna and salmon. Unsalted nuts. Dried peas, beans, and lentils without added salt. Unsalted canned beans. Eggs. Unsalted nut butters. Dairy Milk. Soy milk. Cheese that is naturally low in sodium, such as ricotta cheese, fresh mozzarella, or Swiss cheese. Low-sodium or reduced-sodium cheese. Cream cheese. Yogurt. Seasonings and condiments Fresh and dried herbs and spices. Salt-free seasonings. Low-sodium mustard and ketchup. Sodium-free salad dressing. Sodium-free light mayonnaise. Fresh or refrigerated horseradish. Lemon juice. Vinegar. Other foods Homemade, reduced-sodium, or low-sodium soups. Unsalted popcorn and pretzels. Low-salt or salt-free chips. The items listed above may not be a complete list of foods and beverages you can eat. Contact a dietitian for more information. What foods should I avoid? Vegetables Sauerkraut, pickled vegetables, and relishes. Olives. [place] fries. Onion rings. Regular canned vegetables (not low-sodium or reduced-sodium). Regular canned tomato sauce and paste (not low-sodium or reduced-sodium). Regular tomato and vegetable juice (not low-sodium or reduced-sodium). Frozen vegetables in sauces. Grains Instant hot cereals. Bread stuffing, pancake, and biscuit mixes. Croutons. Seasoned rice or pasta mixes. Noodle soup cups. Boxed or frozen macaroni and cheese. Regular salted crackers. Self-rising flour. Meats and other proteins Meat or fish that is salted, canned, smoked, spiced, or pickled. Precooked or cured meat, such as  sausages or meat loaves. [place] Salines. Ham. Pepperoni. Hot dogs. Corned beef. Chipped beef. Salt pork. Jerky. Pickled herring. Anchovies and sardines. Regular canned tuna. Salted nuts. Dairy Processed cheese and cheese spreads. Hard cheeses. Cheese curds. Blue cheese. Feta cheese. String cheese. Regular cottage cheese. Buttermilk. Canned milk. Fats and oils Salted butter. Regular margarine. Ghee. Bacon fat. Seasonings and condiments Onion salt, garlic salt, seasoned salt, table salt, and sea salt. Canned and packaged gravies. Worcestershire sauce. Tartar sauce. Barbecue sauce. Teriyaki sauce. Soy sauce, including reduced-sodium. Steak sauce. Fish sauce. Oyster sauce. Cocktail sauce. Horseradish that you find on the shelf. Regular ketchup and mustard. Meat flavorings and tenderizers. Bouillon cubes. Hot sauce. Pre-made or packaged marinades. Pre-made or packaged taco seasonings. Relishes.  Regular salad dressings. Salsa. Other foods Salted popcorn and pretzels. Corn chips and puffs. Potato and tortilla chips. Canned or dried soups. Pizza. Frozen entrees and pot pies. The items listed above may not be a complete list of foods and beverages you should avoid. Contact a dietitian for more information. Summary Eating less sodium can help lower your blood pressure, reduce swelling, and protect your heart, liver, and kidneys. Most people on this plan should limit their sodium intake to 1,500-2,000 mg (milligrams) of sodium each day. Canned, boxed, and frozen foods are high in sodium. Restaurant foods, fast foods, and pizza are also very high in sodium. You also get sodium by adding salt to food. Try to cook at home, eat more fresh fruits and vegetables, and eat less fast food and canned, processed, or prepared foods. This information is not intended to replace advice given to you by your health care provider. Make sure you discuss any questions you have with your health care provider. Document Revised: 01/11/2020  Document Reviewed: 11/07/2019 Elsevier Patient Education  [place]         Signed, Finis Bud, NP  09/23/2022 4:08 PM    [place]

## 2022-09-23 ENCOUNTER — Other Ambulatory Visit
Admission: RE | Admit: 2022-09-23 | Discharge: 2022-09-23 | Disposition: A | Discharge status: Home / Self Care | Payer: PPO | Source: Ambulatory Visit | Attending: Nurse Practitioner | Admitting: Nurse Practitioner

## 2022-09-23 ENCOUNTER — Ambulatory Visit: Payer: PPO | Attending: Nurse Practitioner | Admitting: Nurse Practitioner

## 2022-09-23 ENCOUNTER — Ambulatory Visit (INDEPENDENT_AMBULATORY_CARE_PROVIDER_SITE_OTHER): Payer: PPO

## 2022-09-23 ENCOUNTER — Encounter: Payer: Self-pay | Admitting: Nurse Practitioner

## 2022-09-23 VITALS — BP 140/62 | HR 66 | Ht 73.0 in | Wt 224.8 lb

## 2022-09-23 DIAGNOSIS — R5383 Other fatigue: Secondary | ICD-10-CM

## 2022-09-23 DIAGNOSIS — Z79899 Other long term (current) drug therapy: Secondary | ICD-10-CM | POA: Insufficient documentation

## 2022-09-23 DIAGNOSIS — G4733 Obstructive sleep apnea (adult) (pediatric): Secondary | ICD-10-CM

## 2022-09-23 DIAGNOSIS — R42 Dizziness and giddiness: Secondary | ICD-10-CM

## 2022-09-23 DIAGNOSIS — I779 Disorder of arteries and arterioles, unspecified: Secondary | ICD-10-CM | POA: Diagnosis not present

## 2022-09-23 DIAGNOSIS — M25473 Effusion, unspecified ankle: Secondary | ICD-10-CM | POA: Diagnosis not present

## 2022-09-23 DIAGNOSIS — E785 Hyperlipidemia, unspecified: Secondary | ICD-10-CM

## 2022-09-23 DIAGNOSIS — I10 Essential (primary) hypertension: Secondary | ICD-10-CM | POA: Diagnosis not present

## 2022-09-23 LAB — CBC
HCT: 40.8 % (ref 39.0–52.0)
Hemoglobin: 13.5 g/dL (ref 13.0–17.0)
MCH: 30.7 pg (ref 26.0–34.0)
MCHC: 33.1 g/dL (ref 30.0–36.0)
MCV: 92.7 fL (ref 80.0–100.0)
Platelets: 213 10*3/uL (ref 150–400)
RBC: 4.4 MIL/uL (ref 4.22–5.81)
RDW: 12.8 % (ref 11.5–15.5)
WBC: 6.9 10*3/uL (ref 4.0–10.5)
nRBC: 0 % (ref 0.0–0.2)

## 2022-09-23 LAB — MAGNESIUM: Magnesium: 2 mg/dL (ref 1.7–2.4)

## 2022-09-23 NOTE — Patient Instructions (Addendum)
Medication Instructions:  Your physician recommends that you continue on your current medications as directed. Please refer to the Current Medication list given to you today.  *If you need a refill on your cardiac medications before your next appointment, please call your pharmacy*   Lab Work: CBC and Mag today If you have labs (blood work) drawn today and your tests are completely normal, you will receive your results only by: [place] (if you have MyChart) OR A paper copy in the mail If you have any lab test that is abnormal or we need to change your treatment, we will call you to review the results.   Testing/Procedures: Your physician has requested that you have a carotid duplex. This test is an ultrasound of the carotid arteries in your neck. It looks at blood flow through these arteries that supply the brain with blood. Allow one hour for this exam. There are no restrictions or special instructions.   Your physician has requested that you have an echocardiogram. Echocardiography is a painless test that uses sound waves to create images of your heart. It provides your doctor with information about the size and shape of your heart and how well your heart's chambers and valves are working. This procedure takes approximately one hour. There are no restrictions for this procedure.   ZIO XT- Long Term Monitor Instructions  Your physician has requested you wear a ZIO patch monitor for 14 days. Do not apply until after echocardiogram is complete. This is a single patch monitor. [employer] supplies one patch monitor per enrollment. Additional stickers are not available. Please do not apply patch if you will be having a Nuclear Stress Test,  Echocardiogram, Cardiac CT, MRI, or Chest Xray during the period you would be wearing the  monitor. The patch cannot be worn during these tests. You cannot remove and re-apply the  ZIO XT patch monitor.  Your ZIO patch monitor will be mailed 3 day USPS  to your address on file. It may take 3-5 days  to receive your monitor after you have been enrolled.  Once you have received your monitor, please review the enclosed instructions. Your monitor  has already been registered assigning a specific monitor serial # to you.  Billing and Patient Assistance Program Information  We have supplied [employer] with any of your insurance information on file for billing purposes. [employer] offers a sliding scale Patient Assistance Program for patients that do not have  insurance, or whose insurance does not completely cover the cost of the ZIO monitor.  You must apply for the Patient Assistance Program to qualify for this discounted rate.  To apply, please call [employer] at [phone], select option 4, select option 2, ask to apply for  Patient Assistance Program. Theodore Demark will ask your household income, and how many people  are in your household. They will quote your out-of-pocket cost based on that information.  [employer] will also be able to set up a 47-month interest-free payment plan if needed.  Applying the monitor   Shave hair from upper left chest.  Hold abrader disc by orange tab. Rub abrader in 40 strokes over the upper left chest as  indicated in your monitor instructions.  Clean area with 4 enclosed alcohol pads. Let dry.  Apply patch as indicated in monitor instructions. Patch will be placed under collarbone on left  side of chest with arrow pointing upward.  Rub patch adhesive wings for 2 minutes. Remove white label marked "1". Remove the white  label marked "2". Rub patch adhesive wings for 2 additional minutes.  While looking in a mirror, press and release button in center of patch. A small green light will  flash 3-4 times. This will be your only indicator that the monitor has been turned on.  Do not shower for the first 24 hours. You may shower after the first 24 hours.  Press the button if you feel a symptom. You will hear a small click.  Record Date, Time and  Symptom in the Patient Logbook.  When you are ready to remove the patch, follow instructions on the last 2 pages of Patient  Logbook. Stick patch monitor onto the last page of Patient Logbook.  Place Patient Logbook in the blue and white box. Use locking tab on box and tape box closed  securely. The blue and white box has prepaid postage on it. Please place it in the mailbox as  soon as possible. Your physician should have your test results approximately 7 days after the  monitor has been mailed back to [facility].  Call [place] at [phone] if you have questions regarding  your ZIO XT patch monitor. Call them immediately if you see an orange light blinking on your  monitor.  If your monitor falls off in less than 4 days, contact our Monitor department at [phone].  If your monitor becomes loose or falls off after 4 days call [employer] at [phone] for  suggestions on securing your monitor    Follow-Up: At [facility], you and your health needs are our priority.  As part of our continuing mission to provide you with exceptional heart care, we have created designated Provider Care Teams.  These Care Teams include your primary Cardiologist (physician) and Advanced Practice Providers (APPs -  Physician Assistants and Nurse Practitioners) who all work together to provide you with the care you need, when you need it.  We recommend signing up for the patient portal called "MyChart".  Sign up information is provided on this After Visit Summary.  MyChart is used to connect with patients for Virtual Visits (Telemedicine).  Patients are able to view lab/test results, encounter notes, upcoming appointments, etc.  Non-urgent messages can be sent to your provider as well.   To learn more about what you can do with MyChart, go to [email or web address].    Your next appointment:   6-8 week(s)  The format for your next appointment:    In Person  Provider:   You may see Ida Rogue, MD or one of the following Advanced Practice Providers on your designated Care Team:   Murray Hodgkins, NP Christell Faith, PA-C Cadence Kathlen Mody, PA-C Gerrie Nordmann, NP   Other Instructions 1.Try to wear compression stockings    Low-Sodium Eating Plan Sodium, which is an element that makes up salt, helps you maintain a healthy balance of fluids in your body. Too much sodium can increase your blood pressure and cause fluid and waste to be held in your body. Your health care provider or dietitian may recommend following this plan if you have high blood pressure (hypertension), kidney disease, liver disease, or heart failure. Eating less sodium can help lower your blood pressure, reduce swelling, and protect your heart, liver, and kidneys. What are tips for following this plan? Reading food labels The Nutrition Facts label lists the amount of sodium in one serving of the food. If you eat more than one serving, you must multiply the listed amount of sodium by the  number of servings. Choose foods with less than 140 mg of sodium per serving. Avoid foods with 300 mg of sodium or more per serving. Shopping  Look for lower-sodium products, often labeled as "low-sodium" or "no salt added." Always check the sodium content, even if foods are labeled as "unsalted" or "no salt added." Buy fresh foods. Avoid canned foods and pre-made or frozen meals. Avoid canned, cured, or processed meats. Buy breads that have less than 80 mg of sodium per slice. Cooking  Eat more home-cooked food and less restaurant, buffet, and fast food. Avoid adding salt when cooking. Use salt-free seasonings or herbs instead of table salt or sea salt. Check with your health care provider or pharmacist before using salt substitutes. Cook with plant-based oils, such as canola, sunflower, or olive oil. Meal planning When eating at a restaurant, ask that your food be prepared with  less salt or no salt, if possible. Avoid dishes labeled as brined, pickled, cured, smoked, or made with soy sauce, miso, or teriyaki sauce. Avoid foods that contain MSG (monosodium glutamate). MSG is sometimes added to [place] food, bouillon, and some canned foods. Make meals that can be grilled, baked, poached, roasted, or steamed. These are generally made with less sodium. General information Most people on this plan should limit their sodium intake to 1,500-2,000 mg (milligrams) of sodium each day. What foods should I eat? Fruits Fresh, frozen, or canned fruit. Fruit juice. Vegetables Fresh or frozen vegetables. "No salt added" canned vegetables. "No salt added" tomato sauce and paste. Low-sodium or reduced-sodium tomato and vegetable juice. Grains Low-sodium cereals, including oats, puffed wheat and rice, and shredded wheat. Low-sodium crackers. Unsalted rice. Unsalted pasta. Low-sodium bread. Whole-grain breads and whole-grain pasta. Meats and other proteins Fresh or frozen (no salt added) meat, poultry, seafood, and fish. Low-sodium canned tuna and salmon. Unsalted nuts. Dried peas, beans, and lentils without added salt. Unsalted canned beans. Eggs. Unsalted nut butters. Dairy Milk. Soy milk. Cheese that is naturally low in sodium, such as ricotta cheese, fresh mozzarella, or Swiss cheese. Low-sodium or reduced-sodium cheese. Cream cheese. Yogurt. Seasonings and condiments Fresh and dried herbs and spices. Salt-free seasonings. Low-sodium mustard and ketchup. Sodium-free salad dressing. Sodium-free light mayonnaise. Fresh or refrigerated horseradish. Lemon juice. Vinegar. Other foods Homemade, reduced-sodium, or low-sodium soups. Unsalted popcorn and pretzels. Low-salt or salt-free chips. The items listed above may not be a complete list of foods and beverages you can eat. Contact a dietitian for more information. What foods should I avoid? Vegetables Sauerkraut, pickled vegetables, and  relishes. Olives. [place] fries. Onion rings. Regular canned vegetables (not low-sodium or reduced-sodium). Regular canned tomato sauce and paste (not low-sodium or reduced-sodium). Regular tomato and vegetable juice (not low-sodium or reduced-sodium). Frozen vegetables in sauces. Grains Instant hot cereals. Bread stuffing, pancake, and biscuit mixes. Croutons. Seasoned rice or pasta mixes. Noodle soup cups. Boxed or frozen macaroni and cheese. Regular salted crackers. Self-rising flour. Meats and other proteins Meat or fish that is salted, canned, smoked, spiced, or pickled. Precooked or cured meat, such as sausages or meat loaves. Berniece Salines. Ham. Pepperoni. Hot dogs. Corned beef. Chipped beef. Salt pork. Jerky. Pickled herring. Anchovies and sardines. Regular canned tuna. Salted nuts. Dairy Processed cheese and cheese spreads. Hard cheeses. Cheese curds. Blue cheese. Feta cheese. String cheese. Regular cottage cheese. Buttermilk. Canned milk. Fats and oils Salted butter. Regular margarine. Ghee. Bacon fat. Seasonings and condiments Onion salt, garlic salt, seasoned salt, table salt, and sea salt. Canned and packaged gravies. Worcestershire  sauce. Tartar sauce. Barbecue sauce. Teriyaki sauce. Soy sauce, including reduced-sodium. Steak sauce. Fish sauce. Oyster sauce. Cocktail sauce. Horseradish that you find on the shelf. Regular ketchup and mustard. Meat flavorings and tenderizers. Bouillon cubes. Hot sauce. Pre-made or packaged marinades. Pre-made or packaged taco seasonings. Relishes. Regular salad dressings. Salsa. Other foods Salted popcorn and pretzels. Corn chips and puffs. Potato and tortilla chips. Canned or dried soups. Pizza. Frozen entrees and pot pies. The items listed above may not be a complete list of foods and beverages you should avoid. Contact a dietitian for more information. Summary Eating less sodium can help lower your blood pressure, reduce swelling, and protect your heart, liver,  and kidneys. Most people on this plan should limit their sodium intake to 1,500-2,000 mg (milligrams) of sodium each day. Canned, boxed, and frozen foods are high in sodium. Restaurant foods, fast foods, and pizza are also very high in sodium. You also get sodium by adding salt to food. Try to cook at home, eat more fresh fruits and vegetables, and eat less fast food and canned, processed, or prepared foods. This information is not intended to replace advice given to you by your health care provider. Make sure you discuss any questions you have with your health care provider. Document Revised: 01/11/2020 Document Reviewed: 11/07/2019 Elsevier Patient Education  [place]

## 2022-09-29 DIAGNOSIS — B9689 Other specified bacterial agents as the cause of diseases classified elsewhere: Secondary | ICD-10-CM | POA: Diagnosis not present

## 2022-09-29 DIAGNOSIS — H8309 Labyrinthitis, unspecified ear: Secondary | ICD-10-CM | POA: Diagnosis not present

## 2022-09-29 DIAGNOSIS — H66003 Acute suppurative otitis media without spontaneous rupture of ear drum, bilateral: Secondary | ICD-10-CM | POA: Diagnosis not present

## 2022-09-29 DIAGNOSIS — J209 Acute bronchitis, unspecified: Secondary | ICD-10-CM | POA: Diagnosis not present

## 2022-09-29 DIAGNOSIS — J019 Acute sinusitis, unspecified: Secondary | ICD-10-CM | POA: Diagnosis not present

## 2022-09-29 DIAGNOSIS — Z03818 Encounter for observation for suspected exposure to other biological agents ruled out: Secondary | ICD-10-CM | POA: Diagnosis not present

## 2022-09-30 ENCOUNTER — Encounter: Payer: Self-pay | Admitting: Internal Medicine

## 2022-09-30 ENCOUNTER — Ambulatory Visit: Payer: PPO | Admitting: Internal Medicine

## 2022-09-30 VITALS — BP 124/80 | HR 72 | Temp 97.8°F | Ht 72.0 in | Wt 223.4 lb

## 2022-09-30 DIAGNOSIS — G4733 Obstructive sleep apnea (adult) (pediatric): Secondary | ICD-10-CM | POA: Diagnosis not present

## 2022-09-30 NOTE — Progress Notes (Signed)
Name: Brian Pearson MRN: [id number] DOB: 11-25-1936     CONSULTATION DATE: 09/30/2022  REFERRING MD :[place] FOR SEVERE SLEEP APNEA Patient diagnosed with severe sleep apnea AHI of 40 178 desaturation episodes 300 total episodes of apnea and hypopnea Excellent compliance report previous office visit 100%   CHIEF COMPLAINT:  Follow-up sleep apnea   HISTORY OF PRESENT ILLNESS:  No exacerbation at this time No evidence of heart failure at this time No evidence or signs of infection at this time No respiratory distress No fevers, chills, nausea, vomiting, diarrhea No evidence of lower extremity edema No evidence hemoptysis  Compliance report reviewed in detail with patient First of the month there was issues with the machine Otherwise he has been 100% compliance for days greater than 4 hours    PAST MEDICAL HISTORY :   has a past medical history of Arthritis, BPH with obstruction/lower urinary tract symptoms, GERD (gastroesophageal reflux disease), Hyperlipidemia, Hypertension, Obesity, Pneumonia, Sleep apnea, Urinary frequency, and Urine incontinence.  has a past surgical history that includes Hernia repair; Colonoscopy with propofol (N/A, 02/09/2016); Esophagogastroduodenoscopy (egd) with propofol (N/A, 02/09/2016); and Knee Arthroplasty (Left, 04/01/2021). Prior to Admission medications   Medication Sig Start Date End Date Taking? Authorizing Provider  amLODipine (NORVASC) 2.5 MG tablet TAKE 1 TABLET BY MOUTH ONCE DAILY 12/18/18   [name], MD  aspirin EC 81 MG tablet Take by mouth.    [provider]  Cholecalciferol (VITAMIN D-3) 1000 units CAPS Take 1 capsule by mouth.    [provider]  Coenzyme Q10 (COQ10 PO) Take 1 capsule by mouth daily.    [provider]  fesoterodine (TOVIAZ) 4 MG TB24 tablet Take 1 tablet (4 mg total) by mouth daily. 05/22/18   [name], PA-C  finasteride  (PROSCAR) 5 MG tablet Take 1 tablet (5 mg total) by mouth daily. 05/01/19   [name], PA-C  Multiple Vitamin (MULTI-VITAMINS) TABS Take by mouth.    [provider]  Omega-3 Fatty Acids (FISH OIL PO) Take by mouth.    [provider]  omeprazole (PRILOSEC) 20 MG capsule TAKE 1 CAPSULE BY MOUTH ONCE DAILY 11/14/18   [name], MD  simvastatin (ZOCOR) 20 MG tablet TAKE 1 TABLET BY MOUTH AT BEDTIME NEEDS  APPOINTMENT  FOR  ADDITIONAL  OR  90  DAY  REFILLS 04/04/19   [name], MD  tamsulosin (FLOMAX) 0.4 MG CAPS capsule Take 1 capsule (0.4 mg total) by mouth daily. 05/01/19   [name], PA-C   Allergies  Allergen Reactions   Oxycodone Other (See Comments)    Patient felt as if he was "burning on the insides" and refuses to take again   Amoxicillin Rash   SOCIAL HISTORY:  reports that he has never smoked. He has never used smokeless tobacco. He reports current alcohol use. He reports that he does not use drugs.     Review of Systems: Gen:  Denies  fever, sweats, chills weight loss  HEENT: Denies blurred vision, double vision, ear pain, eye pain, hearing loss, nose bleeds, sore throat Cardiac:  No dizziness, chest pain or heaviness, chest tightness,edema, No JVD Resp:   No cough, -sputum production, -shortness of breath,-wheezing, -hemoptysis,  Other:  All other systems negative  BP 124/80 (BP Location: Left Arm, Cuff Size: Normal)   Pulse 72   Temp 97.8 F (36.6 C) (Temporal)   Ht 6' (1.829 m)   Wt  223 lb 6.4 oz (101.3 kg)   SpO2 96%   BMI 30.30 kg/m    Physical Examination:   General Appearance: No distress  EYES PERRLA, EOM intact.   NECK Supple, No JVD Pulmonary: normal breath sounds, No wheezing.  CardiovascularNormal S1,S2.  No m/r/g.   Abdomen: Benign, Soft, non-tender. ALL OTHER ROS ARE NEGATIVE    ASSESSMENT AND PLAN SYNOPSIS 86 year old pleasant white male seen today for underlying severe sleep apnea   Encouraged  proper weight management.  Important to get eight or more hours of sleep  Limiting the use of the computer and television before bedtime.  Decrease naps during the day, so night time sleep will become enhanced.  Limit caffeine, and sleep deprivation.  HTN, stroke, uncontrolled diabetes and heart failure are potential risk factors.  Risk of untreated sleep apnea including cardiac arrhthymias, stroke, DM, pulm HTN.   Hypertension Sleep apnea can contribute to hypertension therefore treatment of sleep apnea is important part of hypertension management    MEDICATION ADJUSTMENTS/LABS AND TESTS ORDERED: Continue CPAP as prescribed  CURRENT MEDICATIONS REVIEWED AT LENGTH WITH PATIENT TODAY   Patient satisfied with Plan of action and management. All questions answered  Follow-up in 1 year  Total time spent 15 minutes     Kelley Knoth Patricia Pesa, M.D.  [name]  Medical Director [place] Director [facility] Cardio-Pulmonary Department

## 2022-09-30 NOTE — Patient Instructions (Signed)
Continue CPAP as prescribed Excellent job A+

## 2022-10-06 ENCOUNTER — Telehealth: Payer: Self-pay | Admitting: Family

## 2022-10-06 ENCOUNTER — Other Ambulatory Visit: Payer: Self-pay | Admitting: Internal Medicine

## 2022-10-06 DIAGNOSIS — R1310 Dysphagia, unspecified: Secondary | ICD-10-CM

## 2022-10-11 ENCOUNTER — Telehealth: Payer: Self-pay

## 2022-10-11 ENCOUNTER — Ambulatory Visit (INDEPENDENT_AMBULATORY_CARE_PROVIDER_SITE_OTHER): Payer: PPO

## 2022-10-11 VITALS — Ht 72.0 in | Wt 223.0 lb

## 2022-10-11 DIAGNOSIS — Z0000 Encounter for general adult medical examination without abnormal findings: Secondary | ICD-10-CM

## 2022-10-11 NOTE — Telephone Encounter (Signed)
Opened in error

## 2022-10-11 NOTE — Progress Notes (Signed)
Subjective:   Brian Pearson is a 86 y.o. male who presents for Medicare Annual/Subsequent preventive examination.  Review of Systems    No ROS.  Medicare Wellness Virtual Visit.  Visual/audio telehealth visit, UTA vital signs.   See social history for additional risk factors.   Cardiac Risk Factors include: advanced age (>30mn, >>82women);male gender     Objective:    Today's Vitals   10/11/22 0922  Weight: 223 lb (101.2 kg)  Height: 6' (1.829 m)   Body mass index is 30.24 kg/m.     10/11/2022    9:32 AM 03/10/2022    2:51 PM 10/08/2021    9:11 AM 04/13/2021    9:29 AM 04/01/2021   12:42 PM 03/23/2021    8:32 AM 10/05/2019    9:13 AM  Advanced Directives  Does Patient Have a Medical Advance Directive? Yes Yes Yes  Yes Yes Yes  Type of AParamed[place]of [place] will  [place] Living will;Healthcare Power of Attorney Living will Living will [place]Living will  Does patient want to make changes to medical advance directive? No - Patient declined No - Patient declined No - Patient declined  No - Patient declined  No - Patient declined  Copy of [place] Chart? No - copy requested  No - copy requested    Yes - validated most recent copy scanned in chart (See row information)    Current Medications (verified) Outpatient Encounter Medications as of 10/11/2022  Medication Sig   amLODipine (NORVASC) 10 MG tablet Take 1 tablet by mouth once daily   Coenzyme Q10 (COQ10 PO) Take 1 capsule by mouth daily.   Cyanocobalamin 5000 MCG SUBL Place 5,000 mcg under the tongue daily.   finasteride (PROSCAR) 5 MG tablet Take 1 tablet by mouth once daily   losartan (COZAAR) 50 MG tablet Take 1 tablet (50 mg total) by mouth daily.   moxifloxacin (AVELOX) 400 MG tablet Take 400 mg by mouth daily.   Multiple Vitamin (MULTI-VITAMINS) TABS Take 1 tablet by mouth daily.   Omega-3 Fatty Acids  (FISH OIL PO) Take 1 capsule by mouth daily.   omeprazole (PRILOSEC) 20 MG capsule Take 1 capsule by mouth once daily   simvastatin (ZOCOR) 20 MG tablet TAKE ONE TABLET BY MOUTH AT BEDTIME. APPOINTMENT REQUIRED FOR FUTURE REFILLS   tamsulosin (FLOMAX) 0.4 MG CAPS capsule Take 1 capsule by mouth once daily   No facility-administered encounter medications on file as of 10/11/2022.    Allergies (verified) Oxycodone and Amoxicillin   History: Past Medical History:  Diagnosis Date   Arthritis    BPH with obstruction/lower urinary tract symptoms    GERD (gastroesophageal reflux disease)    Hyperlipidemia    taken off simvastatin last year   Hypertension    Obesity    Pneumonia    hospitalized    Sleep apnea    uses cpap machine    Urinary frequency    Urine incontinence    Past Surgical History:  Procedure Laterality Date   COLONOSCOPY WITH PROPOFOL N/A 02/09/2016   Procedure: COLONOSCOPY WITH PROPOFOL;  Surgeon: PHulen Luster MD;  [facility]  Service: Gastroenterology;  Laterality: N/A;   ESOPHAGOGASTRODUODENOSCOPY (EGD) WITH PROPOFOL N/A 02/09/2016   Procedure: ESOPHAGOGASTRODUODENOSCOPY (EGD) WITH PROPOFOL;  Surgeon: PHulen Luster MD;  [facility];  Service: Gastroenterology;  Laterality: N/A;   HERNIA REPAIR     1990's -  3 surgeries   KNEE ARTHROPLASTY Left 04/01/2021   Procedure: COMPUTER ASSISTED TOTAL KNEE ARTHROPLASTY;  Surgeon: Dereck Leep, MD;  Location: [facility];  Service: Orthopedics;  Laterality: Left;   Family History  Problem Relation Age of Onset   Arthritis Mother    Heart disease Mother        CHF   Cancer Father        Prostate   Dementia Father    Alzheimer's disease Father    Hypotension Sister    Kidney cancer Neg Hx    Bladder Cancer Neg Hx    Social History   Socioeconomic History   Marital status: Married    Spouse name: Not on file   Number of children: Not on file   Years of education: Not on file   Highest  education level: Not on file  Occupational History   Not on file  Tobacco Use   Smoking status: Never   Smokeless tobacco: Never  Vaping Use   Vaping Use: Never used  Substance and Sexual Activity   Alcohol use: Yes    Alcohol/week: 0.0 standard drinks of alcohol    Comment: rarely    Drug use: No   Sexual activity: Yes  Other Topics Concern   Not on file  Social History Narrative   Lives in [place].      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      [place] - regular      Served in [employer] [place] Strain: [place]  (10/11/2022)   Overall Financial Resource Strain (CARDIA)    Difficulty of Paying Living Expenses: Not hard at all  Food Insecurity: No Food Insecurity (10/11/2022)   Hunger Vital Sign    Worried About Running Out of Food in the Last Year: Never true    [place] in the Last Year: Never true  Transportation Needs: No Transportation Needs (10/11/2022)   PRAPARE - Hydrologist (Medical): No    Lack of Transportation (Non-Medical): No  Physical Activity: Sufficiently Active (10/11/2022)   Exercise Vital Sign    Days of Exercise per Week: 5 days    Minutes of Exercise per Session: 30 min  Stress: No Stress Concern Present (10/11/2022)   [place]    Feeling of Stress : Not at all  Social Connections: Moderately Integrated (10/11/2022)   Social Connection and Isolation Panel [NHANES]    Frequency of Communication with Friends and Family: Three times a week    Frequency of Social Gatherings with Friends and Family: More than three times a week    Attends Religious Services: 1 to 4 times per year    Active Member of [employer] or Organizations: No    Attends Music therapist: Never    Marital Status: Married     Tobacco Counseling Counseling given: Not Answered   Clinical Intake:  Pre-visit preparation completed: Yes        Diabetes: No  How often do you need to have someone help you when you read instructions, pamphlets, or other written materials from your doctor or pharmacy?: 1 - Never    Interpreter Needed?: No      Activities of Daily Living  10/11/2022    9:24 AM  In your present state of health, do you have any difficulty performing the following activities:  Hearing? 1  Comment Hearing aids  Vision? 0  Difficulty concentrating or making decisions? 0  Walking or climbing stairs? 0  Dressing or bathing? 0  Doing errands, shopping? 0  Preparing Food and eating ? N  Using the Toilet? N  In the past six months, have you accidently leaked urine? N  Do you have problems with loss of bowel control? N  Managing your Medications? N  Managing your Finances? N  Housekeeping or managing your Housekeeping? N    Patient Care Team: [name], MD as PCP - General (Internal Medicine) Minna Merritts, MD as PCP - Cardiology (Cardiology)  Indicate any recent Medical Services you may have received from other than [facility] providers in the past year (date may be approximate).     Assessment:   This is a routine wellness examination for [place].  I connected with  [name] on 10/11/22 by a audio enabled telemedicine application and verified that I am speaking with the correct person using two identifiers.  Patient Location: Home  Provider Location: Office/Clinic  I discussed the limitations of evaluation and management by telemedicine. The patient expressed understanding and agreed to proceed.   Hearing/Vision screen Hearing Screening - Comments:: [facility]  Hearing aid, bilateral Vision Screening - Comments:: [facility])  Wears corrective lenses Annual exams They have regular follow up with the  ophthalmologist  Dietary issues and exercise activities discussed: Current Exercise Habits: Home exercise routine, Type of exercise: calisthenics;stretching, Time (Minutes): 20, Frequency (Times/Week): 5, Weekly Exercise (Minutes/Week): 100, Intensity: Moderate   Goals Addressed             This Visit's Progress    Maintain Healthy Lifestyle   On track    Patient states he want to continue exercise and keep a healthy diet.       Depression Screen    10/11/2022    9:29 AM 09/15/2022    8:58 AM 05/07/2022   10:06 AM 10/08/2021    9:13 AM 10/08/2021    9:05 AM 06/29/2021   12:11 PM 10/07/2020    9:11 AM  PHQ 2/9 Scores  PHQ - 2 Score 0 0 0 0 0 0 0    Fall Risk    10/11/2022    9:28 AM 09/15/2022    8:58 AM 05/07/2022   10:06 AM 10/08/2021    9:13 AM 06/29/2021   12:11 PM  Fall Risk   Falls in the past year? 0 0 0 0 0  Number falls in past yr: 0   0 0  Injury with Fall? 0   0 0  Risk for fall due to : No Fall Risks No Fall Risks No Fall Risks    Follow up Falls evaluation completed Falls evaluation completed Falls evaluation completed  Falls evaluation completed    [place]: Home free of loose throw rugs in walkways, pet beds, electrical cords, etc? Yes  Adequate lighting in your home to reduce risk of falls? Yes   ASSISTIVE DEVICES UTILIZED TO PREVENT FALLS: Life alert? No  Use of a cane, walker or w/c? No  Grab bars in the bathroom? Yes  Shower chair or bench in shower? No  Elevated toilet seat or a handicapped toilet? Yes   TIMED UP AND GO: Was the  test performed? No .    Cognitive Function:    08/30/2016    9:00 AM 08/28/2015    9:09 AM  MMSE - Mini Mental State Exam  Orientation to time 5 5  Orientation to Place 5 5  Registration 3 3  Attention/ Calculation 5 5  Recall 3 3  Language- name 2 objects 2 2  Language- repeat 1 1  Language- follow 3 step command 3 3  Language- read & follow direction 1 1  Write a  sentence 1 1  Copy design 1 1  Total score 30 30        10/11/2022    9:33 AM 10/08/2021    9:19 AM 10/07/2020    9:19 AM 10/05/2019    9:16 AM 08/31/2018   11:12 AM  6CIT Screen  What Year? 0 points 0 points 0 points 0 points 0 points  What month? 0 points 0 points 0 points 0 points 0 points  What time? 0 points 0 points  0 points 0 points  Count back from 20 0 points 0 points  0 points 0 points  Months in reverse 0 points 0 points 0 points 0 points 2 points  Repeat phrase 0 points 0 points 0 points  0 points  Total Score 0 points 0 points   2 points    Immunizations Immunization History  Administered Date(s) Administered   Fluad Quad(high Dose 65+) 10/05/2019, 09/19/2020, 09/15/2022   Influenza, High Dose Seasonal PF 08/28/2015, 08/30/2016, 08/30/2017, 08/31/2018   Influenza-Unspecified 09/21/2014, 10/20/2021   PFIZER(Purple Top)SARS-COV-2 Vaccination 01/17/2020, 02/11/2020   Pneumococcal Conjugate-13 10/22/2014   Pneumococcal-Unspecified 10/23/2011   Tdap 08/28/2015   Covid-19 vaccine status: Completed vaccines x2.  Shingrix Completed?: No.    Education has been provided regarding the importance of this vaccine. Patient has been advised to call insurance company to determine out of pocket expense if they have not yet received this vaccine. Advised may also receive vaccine at local pharmacy or Health Dept. Verbalized acceptance and understanding.  Screening Tests Health Maintenance  Topic Date Due   COVID-19 Vaccine (3 - Pfizer risk series) 10/16/2022 (Originally 03/10/2020)   Pneumonia Vaccine 66+ Years old (2 - PPSV23 or PCV20) 11/06/2022 (Originally 10/23/2015)   Zoster Vaccines- Shingrix (1 of 2) 01/11/2023 (Originally 03/16/1955)   TETANUS/TDAP  08/27/2025   INFLUENZA VACCINE  Completed   HPV VACCINES  Aged Out    Health Maintenance There are no preventive care reminders to display for this patient.   Lung Cancer Screening: (Low Dose CT Chest recommended if Age  56-80 years, 30 pack-year currently smoking OR have quit w/in 15years.) does not qualify.   Hepatitis C Screening: does not qualify.  Vision Screening: Recommended annual ophthalmology exams for early detection of glaucoma and other disorders of the eye.  Dental Screening: Recommended annual dental exams for proper oral hygiene.  Community Resource Referral / Chronic Care Management: CRR required this visit?  No   CCM required this visit?  No      Plan:     I have personally reviewed and noted the following in the patient's chart:   Medical and social history Use of alcohol, tobacco or illicit drugs  Current medications and supplements including opioid prescriptions. Patient is not currently taking opioid prescriptions. Functional ability and status Nutritional status Physical activity Advanced directives List of other physicians Hospitalizations, surgeries, and ER visits in previous 12 months Vitals Screenings to include cognitive, depression, and falls Referrals and appointments  In addition,  I have reviewed and discussed with patient certain preventive protocols, quality metrics, and best practice recommendations. A written personalized care plan for preventive services as well as general preventive health recommendations were provided to patient.     Leta Jungling, LPN   [id number]

## 2022-10-11 NOTE — Patient Instructions (Addendum)
Brian Pearson , Thank you for taking time to come for your Medicare Wellness Visit. I appreciate your ongoing commitment to your health goals. Please review the following plan we discussed and let me know if I can assist you in the future.   These are the goals we discussed:  Goals      Low Carb Diet     Maintain Healthy Lifestyle     Patient states he want to continue exercise and keep a healthy diet.        This is a list of the screening recommended for you and due dates:  Health Maintenance  Topic Date Due   COVID-19 Vaccine (3 - Pfizer risk series) 10/16/2022*   Pneumonia Vaccine (2 - PPSV23 or PCV20) 11/06/2022*   Zoster (Shingles) Vaccine (1 of 2) 01/11/2023*   Tetanus Vaccine  08/27/2025   Flu Shot  Completed   HPV Vaccine  Aged Out  *Topic was postponed. The date shown is not the original due date.    Advanced directives: on file.  Conditions/risks identified: none new.  Next appointment: Follow up in one year for your annual wellness visit.   Preventive Care 50 Years and Older, Male  Preventive care refers to lifestyle choices and visits with your health care provider that can promote health and wellness. What does preventive care include? A yearly physical exam. This is also called an annual well check. Dental exams once or twice a year. Routine eye exams. Ask your health care provider how often you should have your eyes checked. Personal lifestyle choices, including: Daily care of your teeth and gums. Regular physical activity. Eating a healthy diet. Avoiding tobacco and drug use. Limiting alcohol use. Practicing safe sex. Taking low doses of aspirin every day. Taking vitamin and mineral supplements as recommended by your health care provider. What happens during an annual well check? The services and screenings done by your health care provider during your annual well check will depend on your age, overall health, lifestyle risk factors, and family history  of disease. Counseling  Your health care provider may ask you questions about your: Alcohol use. Tobacco use. Drug use. Emotional well-being. Home and relationship well-being. Sexual activity. Eating habits. History of falls. Memory and ability to understand (cognition). Work and work Statistician. Screening  You may have the following tests or measurements: Height, weight, and BMI. Blood pressure. Lipid and cholesterol levels. These may be checked every 5 years, or more frequently if you are over 48 years old. Skin check. Lung cancer screening. You may have this screening every year starting at age 35 if you have a 30-pack-year history of smoking and currently smoke or have quit within the past 15 years. Fecal occult blood test (FOBT) of the stool. You may have this test every year starting at age 19. Flexible sigmoidoscopy or colonoscopy. You may have a sigmoidoscopy every 5 years or a colonoscopy every 10 years starting at age 58. Prostate cancer screening. Recommendations will vary depending on your family history and other risks. Hepatitis C blood test. Hepatitis B blood test. Sexually transmitted disease (STD) testing. Diabetes screening. This is done by checking your blood sugar (glucose) after you have not eaten for a while (fasting). You may have this done every 1-3 years. Abdominal aortic aneurysm (AAA) screening. You may need this if you are a current or former smoker. Osteoporosis. You may be screened starting at age 74 if you are at high risk. Talk with your health care provider about  your test results, treatment options, and if necessary, the need for more tests. Vaccines  Your health care provider may recommend certain vaccines, such as: Influenza vaccine. This is recommended every year. Tetanus, diphtheria, and acellular pertussis (Tdap, Td) vaccine. You may need a Td booster every 10 years. Zoster vaccine. You may need this after age 43. Pneumococcal 13-valent  conjugate (PCV13) vaccine. One dose is recommended after age 53. Pneumococcal polysaccharide (PPSV23) vaccine. One dose is recommended after age 31. Talk to your health care provider about which screenings and vaccines you need and how often you need them. This information is not intended to replace advice given to you by your health care provider. Make sure you discuss any questions you have with your health care provider. Document Released: 01/02/2016 Document Revised: 08/25/2016 Document Reviewed: 10/07/2015 Elsevier Interactive Patient Education  2017 [place] Prevention in the Home Falls can cause injuries. They can happen to people of all ages. There are many things you can do to make your home safe and to help prevent falls. What can I do on the outside of my home? Regularly fix the edges of walkways and driveways and fix any cracks. Remove anything that might make you trip as you walk through a door, such as a raised step or threshold. Trim any bushes or trees on the path to your home. Use bright outdoor lighting. Clear any walking paths of anything that might make someone trip, such as rocks or tools. Regularly check to see if handrails are loose or broken. Make sure that both sides of any steps have handrails. Any raised decks and porches should have guardrails on the edges. Have any leaves, snow, or ice cleared regularly. Use sand or salt on walking paths during winter. Clean up any spills in your garage right away. This includes oil or grease spills. What can I do in the bathroom? Use night lights. Install grab bars by the toilet and in the tub and shower. Do not use towel bars as grab bars. Use non-skid mats or decals in the tub or shower. If you need to sit down in the shower, use a plastic, non-slip stool. Keep the floor dry. Clean up any water that spills on the floor as soon as it happens. Remove soap buildup in the tub or shower regularly. Attach bath mats  securely with double-sided non-slip rug tape. Do not have throw rugs and other things on the floor that can make you trip. What can I do in the bedroom? Use night lights. Make sure that you have a light by your bed that is easy to reach. Do not use any sheets or blankets that are too big for your bed. They should not hang down onto the floor. Have a firm chair that has side arms. You can use this for support while you get dressed. Do not have throw rugs and other things on the floor that can make you trip. What can I do in the kitchen? Clean up any spills right away. Avoid walking on wet floors. Keep items that you use a lot in easy-to-reach places. If you need to reach something above you, use a strong step stool that has a grab bar. Keep electrical cords out of the way. Do not use floor polish or wax that makes floors slippery. If you must use wax, use non-skid floor wax. Do not have throw rugs and other things on the floor that can make you trip. What can I do with  my stairs? Do not leave any items on the stairs. Make sure that there are handrails on both sides of the stairs and use them. Fix handrails that are broken or loose. Make sure that handrails are as long as the stairways. Check any carpeting to make sure that it is firmly attached to the stairs. Fix any carpet that is loose or worn. Avoid having throw rugs at the top or bottom of the stairs. If you do have throw rugs, attach them to the floor with carpet tape. Make sure that you have a light switch at the top of the stairs and the bottom of the stairs. If you do not have them, ask someone to add them for you. What else can I do to help prevent falls? Wear shoes that: Do not have high heels. Have rubber bottoms. Are comfortable and fit you well. Are closed at the toe. Do not wear sandals. If you use a stepladder: Make sure that it is fully opened. Do not climb a closed stepladder. Make sure that both sides of the stepladder  are locked into place. Ask someone to hold it for you, if possible. Clearly mark and make sure that you can see: Any grab bars or handrails. First and last steps. Where the edge of each step is. Use tools that help you move around (mobility aids) if they are needed. These include: Canes. Walkers. Scooters. Crutches. Turn on the lights when you go into a dark area. Replace any light bulbs as soon as they burn out. Set up your furniture so you have a clear path. Avoid moving your furniture around. If any of your floors are uneven, fix them. If there are any pets around you, be aware of where they are. Review your medicines with your doctor. Some medicines can make you feel dizzy. This can increase your chance of falling. Ask your doctor what other things that you can do to help prevent falls. This information is not intended to replace advice given to you by your health care provider. Make sure you discuss any questions you have with your health care provider. Document Released: 10/02/2009 Document Revised: 05/13/2016 Document Reviewed: 01/10/2015 Elsevier Interactive Patient Education  2017 [employer].

## 2022-10-12 NOTE — Telephone Encounter (Signed)
Rx sent for amLODipine

## 2022-10-12 NOTE — Telephone Encounter (Signed)
Refill sent.

## 2022-10-13 ENCOUNTER — Other Ambulatory Visit: Payer: Self-pay | Admitting: Urology

## 2022-10-13 DIAGNOSIS — N401 Enlarged prostate with lower urinary tract symptoms: Secondary | ICD-10-CM

## 2022-11-09 ENCOUNTER — Ambulatory Visit: Payer: PPO | Attending: Nurse Practitioner

## 2022-11-09 ENCOUNTER — Ambulatory Visit (INDEPENDENT_AMBULATORY_CARE_PROVIDER_SITE_OTHER): Payer: PPO

## 2022-11-09 DIAGNOSIS — R42 Dizziness and giddiness: Secondary | ICD-10-CM | POA: Diagnosis not present

## 2022-11-09 LAB — ECHOCARDIOGRAM COMPLETE
AR max vel: 2.98 cm2
AV Area VTI: 2.92 cm2
AV Area mean vel: 2.97 cm2
AV Mean grad: 4 mmHg
AV Peak grad: 7.4 mmHg
AV Vena cont: 0.4 cm
Ao pk vel: 1.36 m/s
Area-P 1/2: 3.83 cm2
Calc EF: 51.4 %
P 1/2 time: 392 msec
S' Lateral: 2.8 cm
Single Plane A2C EF: 52.7 %
Single Plane A4C EF: 51 %

## 2022-11-10 ENCOUNTER — Ambulatory Visit: Payer: PPO | Attending: Cardiology | Admitting: Cardiology

## 2022-11-10 ENCOUNTER — Encounter: Payer: Self-pay | Admitting: Cardiology

## 2022-11-10 VITALS — BP 148/62 | HR 77 | Ht 73.0 in | Wt 225.4 lb

## 2022-11-10 DIAGNOSIS — R42 Dizziness and giddiness: Secondary | ICD-10-CM

## 2022-11-10 DIAGNOSIS — R609 Edema, unspecified: Secondary | ICD-10-CM

## 2022-11-10 DIAGNOSIS — I779 Disorder of arteries and arterioles, unspecified: Secondary | ICD-10-CM

## 2022-11-10 DIAGNOSIS — G4733 Obstructive sleep apnea (adult) (pediatric): Secondary | ICD-10-CM | POA: Diagnosis not present

## 2022-11-10 DIAGNOSIS — E785 Hyperlipidemia, unspecified: Secondary | ICD-10-CM | POA: Diagnosis not present

## 2022-11-10 DIAGNOSIS — I10 Essential (primary) hypertension: Secondary | ICD-10-CM

## 2022-11-10 NOTE — Progress Notes (Signed)
Cardiology Clinic Note   Patient Name: Brian Pearson Date of Encounter: 11/10/2022  Primary Care Provider:  [name], MD Primary Cardiologist:  Ida Rogue, MD  Patient Profile    86 year old male with a history of hypertension, hyperlipidemia, near syncope, OSA on CPAP, and vertigo, who is here today for follow-up.  Past Medical History    Past Medical History:  Diagnosis Date   Arthritis    BPH with obstruction/lower urinary tract symptoms    GERD (gastroesophageal reflux disease)    Hyperlipidemia    taken off simvastatin last year   Hypertension    Obesity    Pneumonia    hospitalized    Sleep apnea    uses cpap machine    Urinary frequency    Urine incontinence    Past Surgical History:  Procedure Laterality Date   COLONOSCOPY WITH PROPOFOL N/A 02/09/2016   Procedure: COLONOSCOPY WITH PROPOFOL;  Surgeon: Hulen Luster, MD;  Location: [facility] ENDOSCOPY;  Service: Gastroenterology;  Laterality: N/A;   ESOPHAGOGASTRODUODENOSCOPY (EGD) WITH PROPOFOL N/A 02/09/2016   Procedure: ESOPHAGOGASTRODUODENOSCOPY (EGD) WITH PROPOFOL;  Surgeon: Hulen Luster, MD;  Location: [facility] ENDOSCOPY;  Service: Gastroenterology;  Laterality: N/A;   HERNIA REPAIR     1990's - 3 surgeries   KNEE ARTHROPLASTY Left 04/01/2021   Procedure: COMPUTER ASSISTED TOTAL KNEE ARTHROPLASTY;  Surgeon: Dereck Leep, MD;  Location: [facility] ORS;  Service: Orthopedics;  Laterality: Left;    Allergies  Allergies  Allergen Reactions   Oxycodone Other (See Comments)    Patient felt as if he was "burning on the insides" and refuses to take again   Amoxicillin Rash    History of Present Illness    Brian Pearson is an 86 year old male with the previously mentioned past medical history of hypertension, hyperlipidemia, near syncope, obstructive sleep apnea on CPAP, and vertigo.  He was initially seen in April 2023 by Dr. Rockey Situ.  He was referred from his primary provider for dizziness and a right  bundle branch block found on EKG.  He was scheduled to have carpal tunnel release but told to cancel today abnormal EKG.  He has a longstanding history of vertigo.  He was last seen in clinic in 09/23/2022 with complaints of dizziness and low energy for the past several months.  With his complaints he was placed on 14-day ZIO monitor, carotid duplex ordered, blood work, and echocardiogram.  Echocardiogram completed in 11/09/2022 which revealed LVEF of 55-60%, no regional wall motion abnormalities, G1 DD, mildly elevated pulmonary artery systolic pressure, mild to moderate mitral regurgitation.  Carotid duplex completed on 11/09/2022 which revealed the right ICA is consistent with 1-39% stenosis, ECA appears<50% stenosis, no hemodynamically significant plaque<50% noted in the CCA.  Left carotid consistent with 1-39% stenosis, not hemodynamically significant<.  Percent noted in the CCA, ECA appears<50% stenosis.  He returns to clinic today stating that overall he is doing fairly well.  He has had no further episodes of lightheadedness, or dizziness.  He currently denies chest pain, shortness of breath, or dyspnea on exertion.  He does endorse fatigue and peripheral edema.  States that he is trying to elevate his extremities at home during the evening when he is in the recliner and that does help with the swelling.  He denies any recent hospitalizations or visits to the emergency department.  Home Medications    Current Outpatient Medications  Medication Sig Dispense Refill   amLODipine (NORVASC) 10 MG tablet Take 1  tablet by mouth once daily 90 tablet 0   Coenzyme Q10 (COQ10 PO) Take 1 capsule by mouth daily.     Cyanocobalamin 5000 MCG SUBL Place 5,000 mcg under the tongue daily.     finasteride (PROSCAR) 5 MG tablet Take 1 tablet by mouth once daily 90 tablet 0   losartan (COZAAR) 50 MG tablet Take 1 tablet (50 mg total) by mouth daily. 90 tablet 1   Multiple Vitamin (MULTI-VITAMINS) TABS Take 1  tablet by mouth daily.     Omega-3 Fatty Acids (FISH OIL PO) Take 1 capsule by mouth daily.     omeprazole (PRILOSEC) 20 MG capsule Take 1 capsule by mouth once daily 90 capsule 0   simvastatin (ZOCOR) 20 MG tablet TAKE ONE TABLET BY MOUTH AT BEDTIME. APPOINTMENT REQUIRED FOR FUTURE REFILLS 90 tablet 0   tamsulosin (FLOMAX) 0.4 MG CAPS capsule Take 1 capsule by mouth once daily 90 capsule 0   No current facility-administered medications for this visit.     Family History    Family History  Problem Relation Age of Onset   Arthritis Mother    Heart disease Mother        CHF   Cancer Father        Prostate   Dementia Father    Alzheimer's disease Father    Hypotension Sister    Kidney cancer Neg Hx    Bladder Cancer Neg Hx    He indicated that his mother is deceased. He indicated that his father is deceased. He indicated that his sister is alive. He indicated that the status of his neg hx is unknown.  Social History    Social History   Socioeconomic History   Marital status: Married    Spouse name: Not on file   Number of children: Not on file   Years of education: Not on file   Highest education level: Not on file  Occupational History   Not on file  Tobacco Use   Smoking status: Never   Smokeless tobacco: Never  Vaping Use   Vaping Use: Never used  Substance and Sexual Activity   Alcohol use: Yes    Alcohol/week: 0.0 standard drinks of alcohol    Comment: rarely    Drug use: No   Sexual activity: Yes  Other Topics Concern   Not on file  Social History Narrative   Lives in [place].      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      [place] - regular      Served in [employer], [place] Strain: [place]  (10/11/2022)   Overall Financial Resource Strain (CARDIA)    Difficulty of Paying Living Expenses: Not hard at all  Food  Insecurity: No Food Insecurity (10/11/2022)   Hunger Vital Sign    Worried About Running Out of Food in the Last Year: Never true    [place] in the Last Year: Never true  Transportation Needs: No Transportation Needs (10/11/2022)   PRAPARE - Hydrologist (Medical): No    Lack of Transportation (Non-Medical): No  Physical Activity: Sufficiently Active (10/11/2022)   Exercise Vital Sign    Days of Exercise per Week: 5 days    Minutes of Exercise per Session: 30 min  Stress: No Stress Concern Present (10/11/2022)   [place]    Feeling of Stress : Not at all  Social Connections: Moderately Integrated (10/11/2022)   Social Connection and Isolation Panel [NHANES]    Frequency of Communication with Friends and Family: Three times a week    Frequency of Social Gatherings with Friends and Family: More than three times a week    Attends Religious Services: 1 to 4 times per year    Active Member of [employer] or Organizations: No    Attends Archivist Meetings: Never    Marital Status: Married  Human resources officer Violence: Not At Risk (10/11/2022)   Humiliation, Afraid, Rape, and Kick questionnaire    Fear of Current or Ex-Partner: No    Emotionally Abused: No    Physically Abused: No    Sexually Abused: No     Review of Systems    General:  No chills, fever, night sweats or weight changes.  Endorses fatigue Cardiovascular:  No chest pain, dyspnea on exertion, endorses chronic peripheral edema, orthopnea, palpitations, paroxysmal nocturnal dyspnea. Dermatological: No rash, lesions/masses Respiratory: No cough, dyspnea Urologic: No hematuria, dysuria Abdominal:   No nausea, vomiting, diarrhea, bright red blood per rectum, melena, or hematemesis Neurologic:  No visual changes, wkns, changes in mental status.  This is occasional dizziness and lightheadedness All other systems reviewed  and are otherwise negative except as noted above.   Physical Exam    VS:  BP (!) 148/62 (BP Location: Left Arm, Patient Position: Sitting, Cuff Size: Large)   Pulse 77   Ht '6\' 1"'$  (1.854 m)   Wt 225 lb 6 oz (102.2 kg)   SpO2 99%   BMI 29.73 kg/m  , BMI Body mass index is 29.73 kg/m.     GEN: Well nourished, well developed, in no acute distress. HEENT: normal. Neck: Supple, no JVD, carotid bruits, or masses. Cardiac: RRR, no murmurs, rubs, or gallops. No clubbing, cyanosis, 1+ pitting edema to the bilateral lower extremities.  Radials/DP/PT 2+ and equal bilaterally.  Respiratory:  Respirations regular and unlabored, clear to auscultation bilaterally. GI: Soft, nontender, nondistended, BS + x 4. MS: no deformity or atrophy. Skin: warm and dry, no rash. Neuro:  Strength and sensation are intact. Psych: Normal affect.  Accessory Clinical Findings    ECG personally reviewed by me today-no new tracings were completed today  Lab Results  Component Value Date   WBC 6.9 09/23/2022   HGB 13.5 09/23/2022   HCT 40.8 09/23/2022   MCV 92.7 09/23/2022   PLT 213 09/23/2022   Lab Results  Component Value Date   CREATININE 0.94 09/15/2022   BUN 14 09/15/2022   NA 140 09/15/2022   K 4.1 09/15/2022   CL 106 09/15/2022   CO2 26 09/15/2022   Lab Results  Component Value Date   ALT 12 09/15/2022   AST 16 09/15/2022   ALKPHOS 83 09/15/2022   BILITOT 0.8 09/15/2022   Lab Results  Component Value Date   CHOL 157 09/15/2022   HDL 55.40 09/15/2022   LDLCALC 72 09/15/2022   TRIG 149.0 09/15/2022   CHOLHDL 3 09/15/2022    Lab Results  Component Value Date   HGBA1C 6.1 09/15/2022    Assessment & Plan   1.  Dizziness and lightheadedness with noted  improvement.  Echocardiogram and carotid ultrasound were completed and were unrevealing CBC did not reveal any anemia when previously drawn.  He continues to have  a ZIO monitor at home and states that he will place it this weekend.  Also  encouraged to continue to remain hydrated as he states that he does not drink adequate amount of water during the day.  2.  Hypertension with blood pressure today of 148/62.  He is continue to monitor his pressure at home he remains slightly elevated today but he does admit to having whitecoat hypertension.  He is continued on amlodipine 10 mg daily, losartan 50 mg daily.  He is also been advised to continue to monitor his pressures at home.  3.  Hyperlipidemia with an LDL in June 2023 was 66 he is to continue on his simvastatin 20 mg daily and co-Q10 daily.  This is currently been managed by his PCP.  4.  Carotid artery disease with repeat carotid duplex which revealed 1 to 39% plaque and less than 50% stenosis no hemodynamically significant stenosis noted on duplex study.  She is to continue with statin therapy.  5.  Peripheral edema that is a chronic issue that he states is unchanged today.  1+ pitting edema noted on exam.  He has been encouraged to participate in conservative therapy of elevating his extremities, foot Pumps, decreasing his sodium intake, and compression stockings.  6.  OSA on CPAP encouraged to continue with compliance.  7.  Disposition patient return to clinic to see MD/APP in 3 months or sooner if needed.  Appointment will be moved up earlier if after his ZIO monitor is returned if any abnormalities are noted.  Elisa Kutner, NP 11/10/2022, 11:03 AM

## 2022-11-10 NOTE — Patient Instructions (Signed)
Medication Instructions:  Your physician recommends that you continue on your current medications as directed. Please refer to the Current Medication list given to you today.  *If you need a refill on your cardiac medications before your next appointment, please call your pharmacy*   Lab Work: None ordered  If you have labs (blood work) drawn today and your tests are completely normal, you will receive your results only by: [place] (if you have MyChart) OR A paper copy in the mail If you have any lab test that is abnormal or we need to change your treatment, we will call you to review the results.   Testing/Procedures: Your physician has recommended that you wear the Zio monitor that was previously mailed to your home.  This monitor is a medical device that records the heart's electrical activity. Doctors most often use these monitors to diagnose arrhythmias. Arrhythmias are problems with the speed or rhythm of the heartbeat. The monitor is a small device applied to your chest. You can wear one while you do your normal daily activities. While wearing this monitor if you have any symptoms to push the button and record what you felt. Once you have worn this monitor for the period of time provider prescribed (Usually 14 days), you will return the monitor device in the postage paid box. Once it is returned they will download the data collected and provide [place] with a report which the provider will then review and we will call you with those results. Important tips:  Avoid showering during the first 24 hours of wearing the monitor. Avoid excessive sweating to help maximize wear time. Do not submerge the device, no hot tubs, and no swimming pools. Keep any lotions or oils away from the patch. After 24 hours you may shower with the patch on. Take brief showers with your back facing the shower head.  Do not remove patch once it has been placed because that will interrupt data and decrease  adhesive wear time. Push the button when you have any symptoms and write down what you were feeling. Once you have completed wearing your monitor, remove and place into box which has postage paid and place in your outgoing mailbox.  If for some reason you have misplaced your box then call our office and we can provide another box and/or mail it off for you.  Follow-Up: At [facility], you and your health needs are our priority.  As part of our continuing mission to provide you with exceptional heart care, we have created designated Provider Care Teams.  These Care Teams include your primary Cardiologist (physician) and Advanced Practice Providers (APPs -  Physician Assistants and Nurse Practitioners) who all work together to provide you with the care you need, when you need it.  We recommend signing up for the patient portal called "MyChart".  Sign up information is provided on this After Visit Summary.  MyChart is used to connect with patients for Virtual Visits (Telemedicine).  Patients are able to view lab/test results, encounter notes, upcoming appointments, etc.  Non-urgent messages can be sent to your provider as well.   To learn more about what you can do with MyChart, go to [email or web address].    Your next appointment:   3 month(s)  The format for your next appointment:   In Person  Provider:   You may see Ida Rogue, MD or one of the following Advanced Practice Providers on your designated Care Team:   Murray Hodgkins, NP Thurmond Butts  [place], PA-C Cadence Furth, PA-C Gerrie Nordmann, NP   Important Information About Sugar

## 2022-11-15 ENCOUNTER — Other Ambulatory Visit: Payer: Self-pay | Admitting: Urology

## 2022-11-15 DIAGNOSIS — N401 Enlarged prostate with lower urinary tract symptoms: Secondary | ICD-10-CM

## 2022-11-17 ENCOUNTER — Ambulatory Visit (INDEPENDENT_AMBULATORY_CARE_PROVIDER_SITE_OTHER): Payer: PPO | Admitting: Internal Medicine

## 2022-11-17 ENCOUNTER — Encounter: Payer: Self-pay | Admitting: Internal Medicine

## 2022-11-17 VITALS — BP 124/72 | HR 72 | Temp 98.0°F | Resp 15 | Ht 72.0 in | Wt 225.6 lb

## 2022-11-17 DIAGNOSIS — E785 Hyperlipidemia, unspecified: Secondary | ICD-10-CM

## 2022-11-17 DIAGNOSIS — R739 Hyperglycemia, unspecified: Secondary | ICD-10-CM | POA: Diagnosis not present

## 2022-11-17 DIAGNOSIS — D649 Anemia, unspecified: Secondary | ICD-10-CM | POA: Diagnosis not present

## 2022-11-17 DIAGNOSIS — N400 Enlarged prostate without lower urinary tract symptoms: Secondary | ICD-10-CM | POA: Diagnosis not present

## 2022-11-17 DIAGNOSIS — I10 Essential (primary) hypertension: Secondary | ICD-10-CM

## 2022-11-17 DIAGNOSIS — G4733 Obstructive sleep apnea (adult) (pediatric): Secondary | ICD-10-CM | POA: Diagnosis not present

## 2022-11-17 NOTE — Progress Notes (Signed)
Patient ID: Brian Pearson, male   DOB: Dec 21, 1935, 86 y.o.   MRN: [id number]   Subjective:    Patient ID: Brian Pearson, male    DOB: 1936-05-06, 86 y.o.   MRN: [id number]   Patient here for  Chief Complaint  Patient presents with   Hypertension   .   HPI Here to follow up regarding hypercholesterolemia and hypertension. Dizziness is better after vestibular rehab.  Saw cardiology 11/10/22 - echo and carotid ultrasound - completed - unrevealing.  Wearing zio monitor.  No chest pain.  Breathing stable.  Eating.  No nausea or vomiting.  Bowels moving.  Continue cpap.    Past Medical History:  Diagnosis Date   Arthritis    BPH with obstruction/lower urinary tract symptoms    GERD (gastroesophageal reflux disease)    Hyperlipidemia    taken off simvastatin last year   Hypertension    Obesity    Pneumonia    hospitalized    Sleep apnea    uses cpap machine    Urinary frequency    Urine incontinence    Past Surgical History:  Procedure Laterality Date   COLONOSCOPY WITH PROPOFOL N/A 02/09/2016   Procedure: COLONOSCOPY WITH PROPOFOL;  Surgeon: Hulen Luster, MD;  Location: [facility] ENDOSCOPY;  Service: Gastroenterology;  Laterality: N/A;   ESOPHAGOGASTRODUODENOSCOPY (EGD) WITH PROPOFOL N/A 02/09/2016   Procedure: ESOPHAGOGASTRODUODENOSCOPY (EGD) WITH PROPOFOL;  Surgeon: Hulen Luster, MD;  [facility] ENDOSCOPY;  Service: Gastroenterology;  Laterality: N/A;   HERNIA REPAIR     1990's - 3 surgeries   KNEE ARTHROPLASTY Left 04/01/2021   Procedure: COMPUTER ASSISTED TOTAL KNEE ARTHROPLASTY;  Surgeon: Dereck Leep, MD;  Location: [facility] ORS;  Service: Orthopedics;  Laterality: Left;   Family History  Problem Relation Age of Onset   Arthritis Mother    Heart disease Mother        CHF   Cancer Father        Prostate   Dementia Father    Alzheimer's disease Father    Hypotension Sister    Kidney cancer Neg Hx    Bladder Cancer Neg Hx    Social History   Socioeconomic History    Marital status: Married    Spouse name: Not on file   Number of children: Not on file   Years of education: Not on file   Highest education level: Not on file  Occupational History   Not on file  Tobacco Use   Smoking status: Never   Smokeless tobacco: Never  Vaping Use   Vaping Use: Never used  Substance and Sexual Activity   Alcohol use: Yes    Alcohol/week: 0.0 standard drinks of alcohol    Comment: rarely    Drug use: No   Sexual activity: Yes  Other Topics Concern   Not on file  Social History Narrative   Lives in [place].      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      [place] - regular      [place] in [employer], [place] Strain: [place]  (10/11/2022)   Overall Financial Resource Strain (CARDIA)    Difficulty of Paying Living Expenses: Not hard at all  Food Insecurity: No Food Insecurity (10/11/2022)   Hunger Vital Sign    Worried  About Running Out of Food in the Last Year: Never true    Ran Out of Food in the Last Year: Never true  Transportation Needs: No Transportation Needs (10/11/2022)   PRAPARE - Hydrologist (Medical): No    Lack of Transportation (Non-Medical): No  Physical Activity: Sufficiently Active (10/11/2022)   Exercise Vital Sign    Days of Exercise per Week: 5 days    Minutes of Exercise per Session: 30 min  Stress: No Stress Concern Present (10/11/2022)   [place]    Feeling of Stress : Not at all  Social Connections: Moderately Integrated (10/11/2022)   Social Connection and Isolation Panel [NHANES]    Frequency of Communication with Friends and Family: Three times a week    Frequency of Social Gatherings with Friends and Family: More than three times a week    Attends Religious Services: 1 to 4 times per year     Active Member of [employer] or Organizations: No    Attends Archivist Meetings: Never    Marital Status: Married     Review of Systems  Constitutional:  Negative for appetite change and unexpected weight change.  HENT:  Negative for congestion and sinus pressure.   Respiratory:  Negative for cough, chest tightness and shortness of breath.   Cardiovascular:  Negative for chest pain and palpitations.       No increased swelling.   Gastrointestinal:  Negative for abdominal pain, diarrhea, nausea and vomiting.  Genitourinary:  Negative for difficulty urinating and dysuria.  Musculoskeletal:  Negative for joint swelling and myalgias.  Skin:  Negative for color change and rash.  Neurological:  Negative for headaches.       Dizziness better.   Psychiatric/Behavioral:  Negative for agitation and dysphoric mood.        Objective:     BP 124/72 (BP Location: Left Arm, Patient Position: Sitting, Cuff Size: Large)   Pulse 72   Temp 98 F (36.7 C) (Temporal)   Resp 15   Ht 6' (1.829 m)   Wt 225 lb 9.6 oz (102.3 kg)   SpO2 98%   BMI 30.60 kg/m  Wt Readings from Last 3 Encounters:  11/17/22 225 lb 9.6 oz (102.3 kg)  11/10/22 225 lb 6 oz (102.2 kg)  10/11/22 223 lb (101.2 kg)    Physical Exam Vitals reviewed.  Constitutional:      General: He is not in acute distress.    Appearance: Normal appearance. He is well-developed.  HENT:     Head: Normocephalic and atraumatic.     Right Ear: External ear normal.     Left Ear: External ear normal.  Eyes:     General: No scleral icterus.       Right eye: No discharge.        Left eye: No discharge.     Conjunctiva/sclera: Conjunctivae normal.  Cardiovascular:     Rate and Rhythm: Normal rate and regular rhythm.  Pulmonary:     Effort: Pulmonary effort is normal. No respiratory distress.     Breath sounds: Normal breath sounds.  Abdominal:     General: Bowel sounds are normal.     Palpations: Abdomen is soft.      Tenderness: There is no abdominal tenderness.  Musculoskeletal:        General: No swelling or tenderness.     Cervical back: Neck supple. No tenderness.  Lymphadenopathy:  Cervical: No cervical adenopathy.  Skin:    Findings: No erythema or rash.  Neurological:     Mental Status: He is alert.  Psychiatric:        Mood and Affect: Mood normal.        Behavior: Behavior normal.      Outpatient Encounter Medications as of 11/17/2022  Medication Sig   amLODipine (NORVASC) 10 MG tablet Take 1 tablet by mouth once daily   Coenzyme Q10 (COQ10 PO) Take 1 capsule by mouth daily.   Cyanocobalamin 5000 MCG SUBL Place 5,000 mcg under the tongue daily.   finasteride (PROSCAR) 5 MG tablet Take 1 tablet by mouth once daily   losartan (COZAAR) 50 MG tablet Take 1 tablet (50 mg total) by mouth daily.   Multiple Vitamin (MULTI-VITAMINS) TABS Take 1 tablet by mouth daily.   Omega-3 Fatty Acids (FISH OIL PO) Take 1 capsule by mouth daily.   omeprazole (PRILOSEC) 20 MG capsule Take 1 capsule by mouth once daily   simvastatin (ZOCOR) 20 MG tablet TAKE ONE TABLET BY MOUTH AT BEDTIME. APPOINTMENT REQUIRED FOR FUTURE REFILLS   tamsulosin (FLOMAX) 0.4 MG CAPS capsule Take 1 capsule by mouth once daily   No facility-administered encounter medications on file as of 11/17/2022.     Lab Results  Component Value Date   WBC 6.9 09/23/2022   HGB 13.5 09/23/2022   HCT 40.8 09/23/2022   PLT 213 09/23/2022   GLUCOSE 100 (H) 09/15/2022   CHOL 157 09/15/2022   TRIG 149.0 09/15/2022   HDL 55.40 09/15/2022   LDLCALC 72 09/15/2022   ALT 12 09/15/2022   AST 16 09/15/2022   NA 140 09/15/2022   K 4.1 09/15/2022   CL 106 09/15/2022   CREATININE 0.94 09/15/2022   BUN 14 09/15/2022   CO2 26 09/15/2022   TSH 3.79 05/31/2022   PSA 1.24 06/26/2021   INR 1.0 03/23/2021   HGBA1C 6.1 09/15/2022   MICROALBUR 0.7 10/22/2014      Assessment & Plan:   Problem List Items Addressed This Visit     Anemia     Recheck cbc.       Benign prostatic hyperplasia without urinary obstruction    Has been followed by urology.  On tamsulosin.  Stable.        Essential hypertension    Continue losartan and amlodipine.  Pressure as outlined.   Follow pressures.  Follow metabolic panel.       Relevant Orders   Basic Metabolic Panel (BMET)   Hyperglycemia - Primary    Follow met b and a1c.       Relevant Orders   HgB A1c   Hyperlipidemia    On simvastatin.  Low cholesterol diet and exercise.  Follow lipid panel and liver function tests.        Relevant Orders   Lipid Profile   Hepatic function panel   OSA on CPAP    Continue cpap.  Seeing pulmonary.         [name], MD

## 2022-11-18 ENCOUNTER — Telehealth: Payer: Self-pay | Admitting: Urology

## 2022-11-18 DIAGNOSIS — N401 Enlarged prostate with lower urinary tract symptoms: Secondary | ICD-10-CM

## 2022-11-18 NOTE — Telephone Encounter (Signed)
Pt LMOM that he needs a refill for Tamsulosin and pharmacy told him it needs Dr approval.

## 2022-11-18 NOTE — Telephone Encounter (Signed)
Attempted to reach pt unsuccessfully at all numbers listed in the chart.

## 2022-11-22 ENCOUNTER — Telehealth: Payer: Self-pay | Admitting: Family Medicine

## 2022-11-22 ENCOUNTER — Encounter: Payer: Self-pay | Admitting: Internal Medicine

## 2022-11-22 NOTE — Assessment & Plan Note (Signed)
On simvastatin.  Low cholesterol diet and exercise.  Follow lipid panel and liver function tests.   

## 2022-11-22 NOTE — Telephone Encounter (Signed)
Patient called asking about refilling the Tamsulosin. I informed him that it has been denied because he has a history of Dizziness. Patient requested an appointment to discuss urinary incontinence and to see if there is another medication he can try.

## 2022-11-22 NOTE — Assessment & Plan Note (Signed)
Continue cpap.  Seeing pulmonary.  

## 2022-11-22 NOTE — Progress Notes (Unsigned)
11/23/22 9:04 AM   Brian Pearson 11-07-1936 [id number]  Referring provider:  [name], MD [address] [place],  [place] [address]  Urological history:  1. BPH with LU TS  -PSA 1.24 in 06/2021  -I PSS 15/3 -PVR 35 mL  -tamsulosin 0.4 mg discontinued due to dizziness -finasteride 5 mg daily     2. Nocturia  -improved with use of CPAP     3. ED  -contributing factors of age, BPH, HTN and HLD  -not sexually active     4. Urinary retention  -post-op 03/2020   Benign Prostatic Hypertrophy    HPI: Brian Pearson is a 86 y.o.male who presents today for discussion for alternative medications for tamsulosin as he is having issues with dizzy spells and urinary frequency.  He had issues with dizziness and lightheadedness which he stated were improved with vestibular PT.  He had been worked up by cardiology with an echocardiogram and carotid ultrasound and they were on revealing.  A CBC also did not reveal any anemia.  He has been wearing a ZIO monitor.  He is having issues with urge incontinence.  Patient denies any modifying or aggravating factors.  Patient denies any gross hematuria, dysuria or suprapubic/flank pain.  Patient denies any fevers, chills, nausea or vomiting.    I PSS 15/3  PVR 35 mL    UA yellow clear, SG 1.015, pH 5.5, 0-5 WBC's, 0-2 RBC's, 0-10 epithelial cells, mucus threads present and few bacteria.    IPSS     Row Name 11/23/22 0800         International Prostate Symptom Score   How often have you had the sensation of not emptying your bladder? Not at All     How often have you had to urinate less than every two hours? About half the time     How often have you found you stopped and started again several times when you urinated? Less than half the time     How often have you found it difficult to postpone urination? More than half the time     How often have you had a weak urinary stream? Less than half the time     How  often have you had to strain to start urination? Less than 1 in 5 times     How many times did you typically get up at night to urinate? 3 Times     Total IPSS Score 15       Quality of Life due to urinary symptoms   If you were to spend the rest of your life with your urinary condition just the way it is now how would you feel about that? Mixed               Score:  1-7 Mild 8-19 Moderate 20-35 Severe   PMH: Past Medical History:  Diagnosis Date   Arthritis    BPH with obstruction/lower urinary tract symptoms    GERD (gastroesophageal reflux disease)    Hyperlipidemia    taken off simvastatin last year   Hypertension    Obesity    Pneumonia    hospitalized    Sleep apnea    uses cpap machine    Urinary frequency    Urine incontinence     Surgical History: Past Surgical History:  Procedure Laterality Date   COLONOSCOPY WITH PROPOFOL N/A 02/09/2016   Procedure: COLONOSCOPY WITH PROPOFOL;  Surgeon: Hulen Luster, MD;  Location:  [facility] ENDOSCOPY;  Service: Gastroenterology;  Laterality: N/A;   ESOPHAGOGASTRODUODENOSCOPY (EGD) WITH PROPOFOL N/A 02/09/2016   Procedure: ESOPHAGOGASTRODUODENOSCOPY (EGD) WITH PROPOFOL;  Surgeon: Hulen Luster, MD;  Location: [facility] ENDOSCOPY;  Service: Gastroenterology;  Laterality: N/A;   HERNIA REPAIR     1990's - 3 surgeries   KNEE ARTHROPLASTY Left 04/01/2021   Procedure: COMPUTER ASSISTED TOTAL KNEE ARTHROPLASTY;  Surgeon: Dereck Leep, MD;  Location: [facility] ORS;  Service: Orthopedics;  Laterality: Left;    Home Medications:  Allergies as of 11/23/2022       Reactions   Oxycodone Other (See Comments)   Patient felt as if he was "burning on the insides" and refuses to take again   Amoxicillin Rash        Medication List        Accurate as of November 23, 2022  9:04 AM. If you have any questions, ask your nurse or doctor.          amLODipine 10 MG tablet Commonly known as: NORVASC Take 1 tablet by mouth once daily   COQ10  PO Take 1 capsule by mouth daily.   Cyanocobalamin 5000 MCG Subl Place 5,000 mcg under the tongue daily.   finasteride 5 MG tablet Commonly known as: PROSCAR Take 1 tablet by mouth once daily   FISH OIL PO Take 1 capsule by mouth daily.   losartan 50 MG tablet Commonly known as: COZAAR Take 1 tablet (50 mg total) by mouth daily.   mirabegron ER 25 MG Tb24 tablet Commonly known as: MYRBETRIQ Take 1 tablet (25 mg total) by mouth daily.   Multi-Vitamins Tabs Take 1 tablet by mouth daily.   omeprazole 20 MG capsule Commonly known as: PRILOSEC Take 1 capsule by mouth once daily   simvastatin 20 MG tablet Commonly known as: ZOCOR TAKE ONE TABLET BY MOUTH AT BEDTIME. APPOINTMENT REQUIRED FOR FUTURE REFILLS   tamsulosin 0.4 MG Caps capsule Commonly known as: FLOMAX Take 1 capsule by mouth once daily        Allergies:  Allergies  Allergen Reactions   Oxycodone Other (See Comments)    Patient felt as if he was "burning on the insides" and refuses to take again   Amoxicillin Rash    Family History: Family History  Problem Relation Age of Onset   Arthritis Mother    Heart disease Mother        CHF   Cancer Father        Prostate   Dementia Father    Alzheimer's disease Father    Hypotension Sister    Kidney cancer Neg Hx    Bladder Cancer Neg Hx     Social History:  reports that he has never smoked. He has never used smokeless tobacco. He reports current alcohol use. He reports that he does not use drugs.   Physical Exam: BP (!) 168/73   Pulse 92   Ht '6\' 1"'$  (1.854 m)   Wt 225 lb (102.1 kg)   BMI 29.69 kg/m   Constitutional:  Well nourished. Alert and oriented, No acute distress. HEENT: [place] AT, moist mucus membranes.  Trachea midline Cardiovascular: No clubbing, cyanosis, or edema. Respiratory: Normal respiratory effort, no increased work of breathing. Neurologic: Grossly intact, no focal deficits, moving all 4 extremities. Psychiatric: Normal mood and  affect.   Laboratory Data: Urinalysis See EPIC and HPI I have reviewed the labs.    Pertinent Imaging: Results for orders placed or performed in visit on 11/23/22  Bladder Scan (Post Void Residual) in office  Result Value Ref Range   Scan Result 71m       Assessment & Plan:    1. BPH with LUTS -aged out of screening -PVR < 300 cc -symptoms - frequency, urge incontinence and weak stream -continue conservative management, avoiding bladder irritants and timed voiding's -continue finasteride 5 mg daily    2. Urge incontinence -UA benign  -most bothersome symptom is urge incontinence -Bladder scan demonstrates significant bladder emptying, so we will have a trial of Myrbetriq 25 mg daily, 6 weeks samples are given  Return in about 6 weeks (around 01/04/2023) for IPSS and PVR.  Caffie Sotto, [place] [place] [phone]

## 2022-11-22 NOTE — Assessment & Plan Note (Signed)
Recheck cbc.  

## 2022-11-22 NOTE — Assessment & Plan Note (Signed)
Continue losartan and amlodipine.  Pressure as outlined.   Follow pressures.  Follow metabolic panel.  

## 2022-11-22 NOTE — Assessment & Plan Note (Signed)
Has been followed by urology.  On tamsulosin.  Stable.

## 2022-11-22 NOTE — Assessment & Plan Note (Signed)
Follow met b and a1c.  

## 2022-11-23 ENCOUNTER — Encounter: Payer: Self-pay | Admitting: Urology

## 2022-11-23 ENCOUNTER — Ambulatory Visit (INDEPENDENT_AMBULATORY_CARE_PROVIDER_SITE_OTHER): Payer: PPO | Admitting: Urology

## 2022-11-23 ENCOUNTER — Other Ambulatory Visit: Payer: Self-pay | Admitting: *Deleted

## 2022-11-23 VITALS — BP 168/73 | HR 92 | Ht 73.0 in | Wt 225.0 lb

## 2022-11-23 DIAGNOSIS — N3941 Urge incontinence: Secondary | ICD-10-CM

## 2022-11-23 DIAGNOSIS — N401 Enlarged prostate with lower urinary tract symptoms: Secondary | ICD-10-CM | POA: Diagnosis not present

## 2022-11-23 DIAGNOSIS — I779 Disorder of arteries and arterioles, unspecified: Secondary | ICD-10-CM

## 2022-11-23 LAB — MICROSCOPIC EXAMINATION

## 2022-11-23 LAB — URINALYSIS, COMPLETE
Bilirubin, UA: NEGATIVE
Glucose, UA: NEGATIVE
Ketones, UA: NEGATIVE
Leukocytes,UA: NEGATIVE
Nitrite, UA: NEGATIVE
Protein,UA: NEGATIVE
RBC, UA: NEGATIVE
Specific Gravity, UA: 1.015 (ref 1.005–1.030)
Urobilinogen, Ur: 0.2 mg/dL (ref 0.2–1.0)
pH, UA: 5.5 (ref 5.0–7.5)

## 2022-11-23 LAB — BLADDER SCAN AMB NON-IMAGING

## 2022-11-23 MED ORDER — MIRABEGRON ER 25 MG PO TB24: 25.0000 mg | ORAL_TABLET | Freq: Every day | ORAL | 0 refills | Status: DC

## 2022-11-25 DIAGNOSIS — G4733 Obstructive sleep apnea (adult) (pediatric): Secondary | ICD-10-CM | POA: Diagnosis not present

## 2022-11-29 DIAGNOSIS — R42 Dizziness and giddiness: Secondary | ICD-10-CM | POA: Diagnosis not present

## 2022-12-13 ENCOUNTER — Other Ambulatory Visit: Payer: Self-pay | Admitting: Internal Medicine

## 2022-12-13 DIAGNOSIS — E785 Hyperlipidemia, unspecified: Secondary | ICD-10-CM

## 2022-12-27 ENCOUNTER — Other Ambulatory Visit: Payer: Self-pay | Admitting: Internal Medicine

## 2023-01-03 NOTE — Progress Notes (Unsigned)
01/04/23 9:51 AM   Brian Pearson 1936/03/14 [id number]  Referring provider:  [name], [address] [place],  [place] [address]  Urological history:  1. BPH with LU TS  -PSA 1.24 in 06/2021  -I PSS 16/2 -PVR 14 mL  -tamsulosin 0.4 mg discontinued due to dizziness -finasteride 5 mg daily     2. Nocturia  -improved with use of CPAP     3. ED  -contributing factors of age, BPH, HTN and HLD  -not sexually active     4. Urinary retention  -post-op 03/2020   Benign Prostatic Hypertrophy    HPI: Brian Pearson is a 87 y.o.male who presents today for a 6 week follow up after a trial of Myrbetriq for urge incontinence,  At his visit on 11/23/2022, he was having issues with dizzy spells and urinary frequency with his tamsulosin.  He had issues with dizziness and lightheadedness which he stated were improved with vestibular PT.  He had been worked up by cardiology with an echocardiogram and carotid ultrasound and they were on revealing.  A CBC also did not reveal any anemia.  He has been wearing a ZIO monitor.  He is having issues with urge incontinence.  I PSS 15/3  PVR 35 mL.  UA yellow clear, SG 1.015, pH 5.5, 0-5 WBC's, 0-2 RBC's, 0-10 epithelial cells, mucus threads present and few bacteria.    I PSS 16/2  PVR 14 mL  He feels that the Myrbetriq 25 mg daily samples have controlled his urge incontinence.  He is also no longer having the dizzy spells.  Patient denies any modifying or aggravating factors.  Patient denies any gross hematuria, dysuria or suprapubic/flank pain.  Patient denies any fevers, chills, nausea or vomiting.     IPSS     Row Name 01/04/23 0900         International Prostate Symptom Score   How often have you had the sensation of not emptying your bladder? Less than 1 in 5     How often have you had to urinate less than every two hours? More than half the time     How often have you found you stopped and started again  several times when you urinated? Less than half the time     How often have you found it difficult to postpone urination? Less than half the time     How often have you had a weak urinary stream? Less than half the time     How often have you had to strain to start urination? About half the time     How many times did you typically get up at night to urinate? 2 Times     Total IPSS Score 16       Quality of Life due to urinary symptoms   If you were to spend the rest of your life with your urinary condition just the way it is now how would you feel about that? Mostly Satisfied                Score:  1-7 Mild 8-19 Moderate 20-35 Severe   PMH: Past Medical History:  Diagnosis Date   Arthritis    BPH with obstruction/lower urinary tract symptoms    GERD (gastroesophageal reflux disease)    Hyperlipidemia    taken off simvastatin last year   Hypertension    Obesity    Pneumonia    hospitalized    Sleep apnea  uses cpap machine    Urinary frequency    Urine incontinence     Surgical History: Past Surgical History:  Procedure Laterality Date   COLONOSCOPY WITH PROPOFOL N/A 02/09/2016   Procedure: COLONOSCOPY WITH PROPOFOL;  Surgeon: Hulen Luster, MD;  Location: [facility] ENDOSCOPY;  Service: Gastroenterology;  Laterality: N/A;   ESOPHAGOGASTRODUODENOSCOPY (EGD) WITH PROPOFOL N/A 02/09/2016   Procedure: ESOPHAGOGASTRODUODENOSCOPY (EGD) WITH PROPOFOL;  Surgeon: Hulen Luster, MD;  Location: [facility] ENDOSCOPY;  Service: Gastroenterology;  Laterality: N/A;   HERNIA REPAIR     1990's - 3 surgeries   KNEE ARTHROPLASTY Left 04/01/2021   Procedure: COMPUTER ASSISTED TOTAL KNEE ARTHROPLASTY;  Surgeon: Dereck Leep, MD;  Location: [facility] ORS;  Service: Orthopedics;  Laterality: Left;    Home Medications:  Allergies as of 01/04/2023       Reactions   Oxycodone Other (See Comments)   Patient felt as if he was "burning on the insides" and refuses to take again   Amoxicillin Rash         Medication List        Accurate as of January 04, 2023  9:51 AM. If you have any questions, ask your nurse or doctor.          amLODipine 10 MG tablet Commonly known as: NORVASC Take 1 tablet by mouth once daily   COQ10 PO Take 1 capsule by mouth daily.   Cyanocobalamin 5000 MCG Subl Place 5,000 mcg under the tongue daily.   finasteride 5 MG tablet Commonly known as: PROSCAR Take 1 tablet by mouth once daily   FISH OIL PO Take 1 capsule by mouth daily.   losartan 50 MG tablet Commonly known as: COZAAR Take 1 tablet by mouth once daily   mirabegron ER 25 MG Tb24 tablet Commonly known as: MYRBETRIQ Take 1 tablet (25 mg total) by mouth daily. Started by: Zara Council, PA-C   Multi-Vitamins Tabs Take 1 tablet by mouth daily.   omeprazole 20 MG capsule Commonly known as: PRILOSEC Take 1 capsule by mouth once daily   simvastatin 20 MG tablet Commonly known as: ZOCOR TAKE 1 TABLET BY MOUTH AT BEDTIME. APPOINTMENT NEEDED FOR FUTURE REFILLS   tamsulosin 0.4 MG Caps capsule Commonly known as: FLOMAX Take 1 capsule by mouth once daily        Allergies:  Allergies  Allergen Reactions   Oxycodone Other (See Comments)    Patient felt as if he was "burning on the insides" and refuses to take again   Amoxicillin Rash    Family History: Family History  Problem Relation Age of Onset   Arthritis Mother    Heart disease Mother        CHF   Cancer Father        Prostate   Dementia Father    Alzheimer's disease Father    Hypotension Sister    Kidney cancer Neg Hx    Bladder Cancer Neg Hx     Social History:  reports that he has never smoked. He has never used smokeless tobacco. He reports current alcohol use. He reports that he does not use drugs.   Physical Exam: BP (!) 153/66   Pulse 81   Ht '6\' 1"'$  (1.854 m)   Wt 227 lb (103 kg)   BMI 29.95 kg/m   Constitutional:  Well nourished. Alert and oriented, No acute distress. HEENT: [place] AT, moist mucus  membranes.  Trachea midline Cardiovascular: No clubbing, cyanosis, or edema. Respiratory: Normal respiratory effort,  no increased work of breathing. Neurologic: Grossly intact, no focal deficits, moving all 4 extremities. Psychiatric: Normal mood and affect.    Laboratory Data: N/A  Pertinent Imaging:  01/04/23 09:26  Scan Result 14    Assessment & Plan:    1. BPH with LUTS -aged out of screening -PVR < 300 cc -symptoms - frequency, urge incontinence and weak stream -continue conservative management, avoiding bladder irritants and timed voiding's -continue finasteride 5 mg daily    2. Urge incontinence -At goal with Myrbetriq 25 mg daily -I have sent a prescription in for Myrbetriq 25 mg daily, I am concerned that his insurance will not cover the medication, so I also gave him a months worth of samples, if his insurance will not cover the medication, we can send in a prescription for [place] 20 mg twice daily with a follow-up in 6 weeks  -If there is no insurance issues with the Myrbetriq we will see him in a year  Return in about 1 year (around 01/05/2024) for IPSS and PVR.  Reather Steller, [place] [address], [place], [place] [phone]

## 2023-01-04 ENCOUNTER — Encounter: Payer: Self-pay | Admitting: Urology

## 2023-01-04 ENCOUNTER — Ambulatory Visit (INDEPENDENT_AMBULATORY_CARE_PROVIDER_SITE_OTHER): Payer: PPO | Admitting: Urology

## 2023-01-04 VITALS — BP 153/66 | HR 81 | Ht 73.0 in | Wt 227.0 lb

## 2023-01-04 DIAGNOSIS — N3941 Urge incontinence: Secondary | ICD-10-CM

## 2023-01-04 DIAGNOSIS — N401 Enlarged prostate with lower urinary tract symptoms: Secondary | ICD-10-CM | POA: Diagnosis not present

## 2023-01-04 LAB — BLADDER SCAN AMB NON-IMAGING: Scan Result: 14

## 2023-01-04 MED ORDER — MIRABEGRON ER 25 MG PO TB24: 25.0000 mg | ORAL_TABLET | Freq: Every day | ORAL | 3 refills | Status: DC

## 2023-01-05 ENCOUNTER — Other Ambulatory Visit: Payer: Self-pay | Admitting: Internal Medicine

## 2023-01-08 ENCOUNTER — Other Ambulatory Visit: Payer: Self-pay | Admitting: Urology

## 2023-01-08 DIAGNOSIS — N401 Enlarged prostate with lower urinary tract symptoms: Secondary | ICD-10-CM

## 2023-01-12 ENCOUNTER — Telehealth: Payer: Self-pay

## 2023-01-12 ENCOUNTER — Other Ambulatory Visit: Payer: Self-pay | Admitting: Urology

## 2023-01-12 DIAGNOSIS — N3941 Urge incontinence: Secondary | ICD-10-CM

## 2023-01-12 MED ORDER — OXYBUTYNIN CHLORIDE ER 10 MG PO TB24: 10.0000 mg | ORAL_TABLET | Freq: Every day | ORAL | 0 refills | Status: DC

## 2023-01-12 NOTE — Progress Notes (Signed)
We we will need to see Mr. Brian Pearson in about 6 weeks to see how he is doing on the oxybutynin as it has more side effects than the Myrbetriq.

## 2023-01-12 NOTE — Telephone Encounter (Signed)
Patient advised and agreed to trying this medication

## 2023-01-12 NOTE — Telephone Encounter (Signed)
Patient states that Myrbetriq will cost too much even with his insurance. What can he try instead maybe cheaper?

## 2023-01-12 NOTE — Progress Notes (Signed)
Appointment made and Brian Pearson advised

## 2023-01-12 NOTE — Telephone Encounter (Signed)
Left message for patient to call back

## 2023-02-03 DIAGNOSIS — M4800 Spinal stenosis, site unspecified: Secondary | ICD-10-CM | POA: Insufficient documentation

## 2023-02-03 DIAGNOSIS — G5603 Carpal tunnel syndrome, bilateral upper limbs: Secondary | ICD-10-CM | POA: Diagnosis not present

## 2023-02-03 DIAGNOSIS — M47812 Spondylosis without myelopathy or radiculopathy, cervical region: Secondary | ICD-10-CM | POA: Diagnosis not present

## 2023-02-03 DIAGNOSIS — I10 Essential (primary) hypertension: Secondary | ICD-10-CM | POA: Diagnosis not present

## 2023-02-03 DIAGNOSIS — R42 Dizziness and giddiness: Secondary | ICD-10-CM | POA: Diagnosis not present

## 2023-02-08 DIAGNOSIS — G4733 Obstructive sleep apnea (adult) (pediatric): Secondary | ICD-10-CM | POA: Diagnosis not present

## 2023-02-14 ENCOUNTER — Other Ambulatory Visit (INDEPENDENT_AMBULATORY_CARE_PROVIDER_SITE_OTHER): Payer: HMO

## 2023-02-14 DIAGNOSIS — I10 Essential (primary) hypertension: Secondary | ICD-10-CM | POA: Diagnosis not present

## 2023-02-14 DIAGNOSIS — E785 Hyperlipidemia, unspecified: Secondary | ICD-10-CM

## 2023-02-14 DIAGNOSIS — R739 Hyperglycemia, unspecified: Secondary | ICD-10-CM

## 2023-02-14 LAB — HEPATIC FUNCTION PANEL
ALT: 13 U/L (ref 0–53)
AST: 17 U/L (ref 0–37)
Albumin: 4.3 g/dL (ref 3.5–5.2)
Alkaline Phosphatase: 80 U/L (ref 39–117)
Bilirubin, Direct: 0.1 mg/dL (ref 0.0–0.3)
Total Bilirubin: 0.5 mg/dL (ref 0.2–1.2)
Total Protein: 6.8 g/dL (ref 6.0–8.3)

## 2023-02-14 LAB — LIPID PANEL
Cholesterol: 152 mg/dL (ref 0–200)
HDL: 52.9 mg/dL (ref >39.00)
LDL Cholesterol: 64 mg/dL (ref 0–99)
NonHDL: 98.78
Total CHOL/HDL Ratio: 3
Triglycerides: 173 mg/dL — ABNORMAL HIGH (ref 0.0–149.0)
VLDL: 34.6 mg/dL (ref 0.0–40.0)

## 2023-02-14 LAB — HEMOGLOBIN A1C: Hgb A1c MFr Bld: 6 % (ref 4.6–6.5)

## 2023-02-14 LAB — BASIC METABOLIC PANEL
BUN: 15 mg/dL (ref 6–23)
CO2: 27 mEq/L (ref 19–32)
Calcium: 9.8 mg/dL (ref 8.4–10.5)
Chloride: 105 mEq/L (ref 96–112)
Creatinine, Ser: 0.94 mg/dL (ref 0.40–1.50)
GFR: 74.23 mL/min (ref >60.00)
Glucose, Bld: 98 mg/dL (ref 70–99)
Potassium: 4.5 mEq/L (ref 3.5–5.1)
Sodium: 140 mEq/L (ref 135–145)

## 2023-02-14 NOTE — Progress Notes (Unsigned)
Cardiology Office Note  Date:  02/15/2023   ID:  Brian Pearson, DOB May 31, 1936, MRN [id number]  PCP:  [name], MD   Chief Complaint  Patient presents with   3 month follow up     "Doing well." Medications reviewed by the patient verbally.    HPI:  Brian Pearson is a 87 year old gentleman with past medical history of Chronic dizziness, better off flomax RBBB HTN Who presents for follow-up of his dizziness/vertigo  Last seen in clinic April 2023 In follow-up he reports that his Dizziness better off tamsulosin, stopped taking it Changed to ditropan XL Overall feels well with no complaints BP at home 130/60s Denies orthostasis symptoms  Active, no regular exercise program  Previously reported hand numbness MRI neck 10/20 1. Severe spinal canal stenosis and right neural foraminal stenosis at C4-5. 2. Severe left C2-3 neural foraminal stenosis. 3. Moderate spinal canal stenosis at C6-7.  MRI head for dizziness reviewed No evidence of recent infarction, hemorrhage, or mass. No abnormal enhancement. Minor chronic microvascular ischemic changes.   PMH:   has a past medical history of Arthritis, BPH with obstruction/lower urinary tract symptoms, GERD (gastroesophageal reflux disease), Hyperlipidemia, Hypertension, Obesity, Pneumonia, Sleep apnea, Urinary frequency, and Urine incontinence.  PSH:    Past Surgical History:  Procedure Laterality Date   COLONOSCOPY WITH PROPOFOL N/A 02/09/2016   Procedure: COLONOSCOPY WITH PROPOFOL;  Surgeon: Hulen Luster, MD;  [facility] ENDOSCOPY;  Service: Gastroenterology;  Laterality: N/A;   ESOPHAGOGASTRODUODENOSCOPY (EGD) WITH PROPOFOL N/A 02/09/2016   Procedure: ESOPHAGOGASTRODUODENOSCOPY (EGD) WITH PROPOFOL;  Surgeon: Hulen Luster, MD;  Location: [facility] ENDOSCOPY;  Service: Gastroenterology;  Laterality: N/A;   HERNIA REPAIR     1990's - 3 surgeries   KNEE ARTHROPLASTY Left 04/01/2021   Procedure: COMPUTER ASSISTED TOTAL KNEE  ARTHROPLASTY;  Surgeon: Dereck Leep, MD;  Location: [facility] ORS;  Service: Orthopedics;  Laterality: Left;    Current Outpatient Medications  Medication Sig Dispense Refill   Alpha Lipoic Acid 200 MG CAPS Take 200 mg by mouth in the morning, at noon, and at bedtime.     amLODipine (NORVASC) 10 MG tablet Take 1 tablet by mouth once daily 90 tablet 0   Coenzyme Q10 (COQ10 PO) Take 1 capsule by mouth daily.     Cyanocobalamin 5000 MCG SUBL Place 5,000 mcg under the tongue daily.     finasteride (PROSCAR) 5 MG tablet Take 1 tablet by mouth once daily 90 tablet 1   losartan (COZAAR) 50 MG tablet Take 1 tablet by mouth once daily 90 tablet 1   Multiple Vitamin (MULTI-VITAMINS) TABS Take 1 tablet by mouth daily.     Omega-3 Fatty Acids (FISH OIL PO) Take 1 capsule by mouth daily.     omeprazole (PRILOSEC) 20 MG capsule Take 1 capsule by mouth once daily 90 capsule 0   oxybutynin (DITROPAN-XL) 10 MG 24 hr tablet Take 1 tablet (10 mg total) by mouth daily. 90 tablet 0   simvastatin (ZOCOR) 20 MG tablet TAKE 1 TABLET BY MOUTH AT BEDTIME. APPOINTMENT NEEDED FOR FUTURE REFILLS 90 tablet 0   tamsulosin (FLOMAX) 0.4 MG CAPS capsule Take 1 capsule by mouth once daily (Patient not taking: Reported on 02/15/2023) 90 capsule 0   No current facility-administered medications for this visit.     Allergies:   Oxycodone and Amoxicillin   Social History:  The patient  reports that he has never smoked. He has never used smokeless tobacco. He reports current alcohol use.  He reports that he does not use drugs.   Family History:   family history includes Alzheimer's disease in his father; Arthritis in his mother; Cancer in his father; Dementia in his father; Heart disease in his mother; Hypotension in his sister.    Review of Systems: Review of Systems  Constitutional: Negative.   HENT: Negative.    Respiratory: Negative.    Cardiovascular: Negative.   Gastrointestinal: Negative.   Musculoskeletal: Negative.    Neurological: Negative.   Psychiatric/Behavioral: Negative.    All other systems reviewed and are negative.    PHYSICAL EXAM: VS:  BP (!) 144/68 (BP Location: Left Arm, Patient Position: Sitting, Cuff Size: Normal)   Pulse 86   Ht '6\' 1"'$  (1.854 m)   Wt 227 lb 2 oz (103 kg)   SpO2 98%   BMI 29.97 kg/m  , BMI Body mass index is 29.97 kg/m. Constitutional:  oriented to person, place, and time. No distress.  HENT:  Head: Grossly normal Eyes:  no discharge. No scleral icterus.  Neck: No JVD, no carotid bruits  Cardiovascular: Regular rate and rhythm, no murmurs appreciated Pulmonary/Chest: Clear to auscultation bilaterally, no wheezes or rails Abdominal: Soft.  no distension.  no tenderness.  Musculoskeletal: Normal range of motion Neurological:  normal muscle tone. Coordination normal. No atrophy Skin: Skin warm and dry Psychiatric: normal affect, pleasant   Recent Labs: 05/31/2022: TSH 3.79 09/23/2022: Hemoglobin 13.5; Magnesium 2.0; Platelets 213 02/14/2023: ALT 13; BUN 15; Creatinine, Ser 0.94; Potassium 4.5; Sodium 140    Lipid Panel Lab Results  Component Value Date   CHOL 152 02/14/2023   HDL 52.90 02/14/2023   LDLCALC 64 02/14/2023   TRIG 173.0 (H) 02/14/2023      Wt Readings from Last 3 Encounters:  02/15/23 227 lb 2 oz (103 kg)  01/04/23 227 lb (103 kg)  11/23/22 225 lb (102.1 kg)     ASSESSMENT AND PLAN:  Problem List Items Addressed This Visit       Cardiology Problems   Hyperlipidemia     Other   OSA on CPAP   Light headedness   Other Visit Diagnoses     Carotid artery disease, unspecified laterality, unspecified type ([place])    -  Primary   Dizziness       Hypertension, unspecified type       Peripheral edema          Chronic dizziness Symptoms have resolved by holding Flomax, changed to Ditropan Blood pressure stable  Essential hypertension Reports blood pressure well-controlled at home He does have some leg swelling which could be  exacerbated by amlodipine He wears compression hose  Leg swelling Wears compression hose Recommend if symptoms get worse we may need to change amlodipine to different blood pressure medication  Hand numbness Of Concern is the cervical spinal cord stenosis and foraminal narrowing noted on MRI 2020.  Reports both hands are numb, longstanding issue Was scheduled for carpal tunnel surgery    Total encounter time more than 30 minutes  Greater than 50% was spent in counseling and coordination of care with the patient    Signed, Esmond Plants, M.D., Ph.D. [place], [place]

## 2023-02-15 ENCOUNTER — Encounter: Payer: Self-pay | Admitting: Cardiovascular Disease

## 2023-02-15 ENCOUNTER — Ambulatory Visit: Payer: HMO | Attending: Cardiovascular Disease | Admitting: Cardiovascular Disease

## 2023-02-15 VITALS — BP 144/68 | HR 86 | Ht 73.0 in | Wt 227.1 lb

## 2023-02-15 DIAGNOSIS — I779 Disorder of arteries and arterioles, unspecified: Secondary | ICD-10-CM | POA: Diagnosis not present

## 2023-02-15 DIAGNOSIS — I10 Essential (primary) hypertension: Secondary | ICD-10-CM

## 2023-02-15 DIAGNOSIS — R42 Dizziness and giddiness: Secondary | ICD-10-CM | POA: Diagnosis not present

## 2023-02-15 DIAGNOSIS — E785 Hyperlipidemia, unspecified: Secondary | ICD-10-CM | POA: Diagnosis not present

## 2023-02-15 DIAGNOSIS — R609 Edema, unspecified: Secondary | ICD-10-CM | POA: Diagnosis not present

## 2023-02-15 DIAGNOSIS — G4733 Obstructive sleep apnea (adult) (pediatric): Secondary | ICD-10-CM

## 2023-02-15 NOTE — Patient Instructions (Signed)
Medication Instructions:  No changes  If you need a refill on your cardiac medications before your next appointment, please call your pharmacy.   Lab work: No new labs needed  Testing/Procedures: No new testing needed  Follow-Up: At [facility], you and your health needs are our priority.  As part of our continuing mission to provide you with exceptional heart care, we have created designated Provider Care Teams.  These Care Teams include your primary Cardiologist (physician) and Advanced Practice Providers (APPs -  Physician Assistants and Nurse Practitioners) who all work together to provide you with the care you need, when you need it.  You will need a follow up appointment as needed  Providers on your designated Care Team:   Christopher Berge, NP Ryan Dunn, PA-C Cadence Furth, PA-C  COVID-19 Vaccine Information can be found at: [email or web address] For questions related to vaccine distribution or appointments, please email [email or web address] or call [phone].    

## 2023-02-16 ENCOUNTER — Encounter: Payer: Self-pay | Admitting: Internal Medicine

## 2023-02-16 ENCOUNTER — Ambulatory Visit (INDEPENDENT_AMBULATORY_CARE_PROVIDER_SITE_OTHER): Payer: HMO | Admitting: Internal Medicine

## 2023-02-16 VITALS — BP 130/72 | HR 72 | Temp 98.3°F | Resp 16 | Ht 72.0 in | Wt 224.0 lb

## 2023-02-16 DIAGNOSIS — R42 Dizziness and giddiness: Secondary | ICD-10-CM

## 2023-02-16 DIAGNOSIS — G4733 Obstructive sleep apnea (adult) (pediatric): Secondary | ICD-10-CM

## 2023-02-16 DIAGNOSIS — I10 Essential (primary) hypertension: Secondary | ICD-10-CM

## 2023-02-16 DIAGNOSIS — R739 Hyperglycemia, unspecified: Secondary | ICD-10-CM | POA: Diagnosis not present

## 2023-02-16 DIAGNOSIS — N400 Enlarged prostate without lower urinary tract symptoms: Secondary | ICD-10-CM

## 2023-02-16 DIAGNOSIS — D649 Anemia, unspecified: Secondary | ICD-10-CM

## 2023-02-16 DIAGNOSIS — E785 Hyperlipidemia, unspecified: Secondary | ICD-10-CM

## 2023-02-16 NOTE — Progress Notes (Signed)
Subjective:    Patient ID: Brian Pearson, male    DOB: 22-Jun-1936, 87 y.o.   MRN: [id number]  Patient here for  Chief Complaint  Patient presents with   Medical Management of Chronic Issues    HPI Here to follow up regarding his blood pressure and cholesterol.   Had been having issues with dizziness.  Better after vestibular rehab.  No problems now. Saw urology 11/23/22 - recommended continuing finasteride.  Trial of myrbetriq - for urge incontinence.  F/u with urology - 01/04/23 - urge incontinence improved with myrbetriq.  Recommended continuing finasteride.  Insurance did not cover myrbetriq.  Ditropan rx sent in - per urology.  Symptoms better. Overall doing better.  Has f/u with urology 02/23/23. Had f/u with neurology - 02/03/23 - dizziness improved as outlined.  F/u with cardiology 02/15/23.  No changes made. Overall he feels he is doing relatively well.  No chest pain.  Breathing stable.  No cough or congestion.  No abdominal pain or bowel change.   Past Medical History:  Diagnosis Date   Arthritis    BPH with obstruction/lower urinary tract symptoms    GERD (gastroesophageal reflux disease)    Hyperlipidemia    taken off simvastatin last year   Hypertension    Obesity    Pneumonia    hospitalized    Sleep apnea    uses cpap machine    Urinary frequency    Urine incontinence    Past Surgical History:  Procedure Laterality Date   COLONOSCOPY WITH PROPOFOL N/A 02/09/2016   Procedure: COLONOSCOPY WITH PROPOFOL;  Surgeon: Hulen Luster, MD;  Location: [facility] ENDOSCOPY;  Service: Gastroenterology;  Laterality: N/A;   ESOPHAGOGASTRODUODENOSCOPY (EGD) WITH PROPOFOL N/A 02/09/2016   Procedure: ESOPHAGOGASTRODUODENOSCOPY (EGD) WITH PROPOFOL;  Surgeon: Hulen Luster, MD;  Location: [facility] ENDOSCOPY;  Service: Gastroenterology;  Laterality: N/A;   HERNIA REPAIR     1990's - 3 surgeries   KNEE ARTHROPLASTY Left 04/01/2021   Procedure: COMPUTER ASSISTED TOTAL KNEE ARTHROPLASTY;  Surgeon: Dereck Leep, MD;  Location: [facility] ORS;  Service: Orthopedics;  Laterality: Left;   Family History  Problem Relation Age of Onset   Arthritis Mother    Heart disease Mother        CHF   Cancer Father        Prostate   Dementia Father    Alzheimer's disease Father    Hypotension Sister    Kidney cancer Neg Hx    Bladder Cancer Neg Hx    Social History   Socioeconomic History   Marital status: Married    Spouse name: Not on file   Number of children: Not on file   Years of education: Not on file   Highest education level: Not on file  Occupational History   Not on file  Tobacco Use   Smoking status: Never   Smokeless tobacco: Never  Vaping Use   Vaping Use: Never used  Substance and Sexual Activity   Alcohol use: Yes    Alcohol/week: 0.0 standard drinks of alcohol    Comment: rarely    Drug use: No   Sexual activity: Yes  Other Topics Concern   Not on file  Social History Narrative   Lives in [place].      Work - retired, works part time driving for [employer] - golf in past      Exercise - stationary bike  Diet - regular      Served in [employer]   Social Determinants of Health   Financial Resource Strain: Low Risk  (10/11/2022)   Overall Financial Resource Strain (CARDIA)    Difficulty of Paying Living Expenses: Not hard at all  Food Insecurity: No Food Insecurity (10/11/2022)   Hunger Vital Sign    Worried About Running Out of Food in the Last Year: Never true    Ran Out of Food in the Last Year: Never true  Transportation Needs: No Transportation Needs (10/11/2022)   PRAPARE - Hydrologist (Medical): No    Lack of Transportation (Non-Medical): No  Physical Activity: Sufficiently Active (10/11/2022)   Exercise Vital Sign    Days of Exercise per Week: 5 days    Minutes of Exercise per Session: 30 min  Stress: No Stress Concern Present (10/11/2022)   [place]    Feeling of Stress : Not at all  Social Connections: Moderately Integrated (10/11/2022)   Social Connection and Isolation Panel [NHANES]    Frequency of Communication with Friends and Family: Three times a week    Frequency of Social Gatherings with Friends and Family: More than three times a week    Attends Religious Services: 1 to 4 times per year    Active Member of [employer] or Organizations: No    Attends Archivist Meetings: Never    Marital Status: Married     Review of Systems  Constitutional:  Negative for appetite change and unexpected weight change.  HENT:  Negative for congestion and sinus pressure.   Respiratory:  Negative for cough, chest tightness and shortness of breath.   Cardiovascular:  Negative for chest pain and palpitations.       Stable lower extremity swelling.  No increase.  Wears compression hose.   Gastrointestinal:  Negative for abdominal pain, diarrhea, nausea and vomiting.  Genitourinary:  Negative for difficulty urinating and dysuria.  Musculoskeletal:  Negative for joint swelling and myalgias.  Skin:  Negative for color change and rash.  Neurological:  Negative for dizziness and headaches.  Psychiatric/Behavioral:  Negative for agitation and dysphoric mood.        Objective:     BP 130/72   Pulse 72   Temp 98.3 F (36.8 C)   Resp 16   Ht 6' (1.829 m)   Wt 224 lb (101.6 kg)   SpO2 99%   BMI 30.38 kg/m  Wt Readings from Last 3 Encounters:  02/16/23 224 lb (101.6 kg)  02/15/23 227 lb 2 oz (103 kg)  01/04/23 227 lb (103 kg)    Physical Exam Vitals reviewed.  Constitutional:      General: He is not in acute distress.    Appearance: Normal appearance. He is well-developed.  HENT:     Head: Normocephalic and atraumatic.     Right Ear: External ear normal.     Left Ear: External ear normal.  Eyes:     General: No scleral icterus.       Right eye: No discharge.        Left eye: No discharge.      Conjunctiva/sclera: Conjunctivae normal.  Cardiovascular:     Rate and Rhythm: Normal rate and regular rhythm.  Pulmonary:     Effort: Pulmonary effort is normal. No respiratory distress.     Breath sounds: Normal breath sounds.  Abdominal:     General: Bowel  sounds are normal.     Palpations: Abdomen is soft.     Tenderness: There is no abdominal tenderness.  Musculoskeletal:        General: No tenderness.     Cervical back: Neck supple. No tenderness.     Comments: Pedal and ankle edema - stable.  DP pulses palpable and equal bilaterally.   Lymphadenopathy:     Cervical: No cervical adenopathy.  Skin:    Findings: No erythema or rash.  Neurological:     Mental Status: He is alert.  Psychiatric:        Mood and Affect: Mood normal.        Behavior: Behavior normal.      Outpatient Encounter Medications as of 02/16/2023  Medication Sig   Alpha Lipoic Acid 200 MG CAPS Take 200 mg by mouth in the morning, at noon, and at bedtime.   amLODipine (NORVASC) 10 MG tablet Take 1 tablet by mouth once daily   Coenzyme Q10 (COQ10 PO) Take 1 capsule by mouth daily.   Cyanocobalamin 5000 MCG SUBL Place 5,000 mcg under the tongue daily.   finasteride (PROSCAR) 5 MG tablet Take 1 tablet by mouth once daily   losartan (COZAAR) 50 MG tablet Take 1 tablet by mouth once daily   Multiple Vitamin (MULTI-VITAMINS) TABS Take 1 tablet by mouth daily.   Omega-3 Fatty Acids (FISH OIL PO) Take 1 capsule by mouth daily.   omeprazole (PRILOSEC) 20 MG capsule Take 1 capsule by mouth once daily   oxybutynin (DITROPAN-XL) 10 MG 24 hr tablet Take 1 tablet (10 mg total) by mouth daily.   simvastatin (ZOCOR) 20 MG tablet TAKE 1 TABLET BY MOUTH AT BEDTIME. APPOINTMENT NEEDED FOR FUTURE REFILLS   No facility-administered encounter medications on file as of 02/16/2023.     Lab Results  Component Value Date   WBC 6.9 09/23/2022   HGB 13.5 09/23/2022   HCT 40.8 09/23/2022   PLT 213 09/23/2022   GLUCOSE 98  02/14/2023   CHOL 152 02/14/2023   TRIG 173.0 (H) 02/14/2023   HDL 52.90 02/14/2023   LDLCALC 64 02/14/2023   ALT 13 02/14/2023   AST 17 02/14/2023   NA 140 02/14/2023   K 4.5 02/14/2023   CL 105 02/14/2023   CREATININE 0.94 02/14/2023   BUN 15 02/14/2023   CO2 27 02/14/2023   TSH 3.79 05/31/2022   PSA 1.24 06/26/2021   INR 1.0 03/23/2021   HGBA1C 6.0 02/14/2023   MICROALBUR 0.7 10/22/2014    No results found.     Assessment & Plan:  Hyperglycemia Assessment & Plan: Follow met b and a1c.   Orders: -     Hemoglobin A1c; Future  Hyperlipidemia, unspecified hyperlipidemia type Assessment & Plan: On simvastatin.  Low cholesterol diet and exercise.  Follow lipid panel and liver function tests.    Orders: -     Lipid panel; Future -     Hepatic function panel; Future  Essential hypertension Assessment & Plan: Continue losartan and amlodipine.  Pressure as outlined.   Follow pressures.  Follow metabolic panel.   Orders: -     Basic metabolic panel; Future -     TSH; Future -     CBC with Differential/Platelet; Future  Anemia, unspecified type Assessment & Plan: Follow cbc.    Benign prostatic hyperplasia without urinary obstruction Assessment & Plan: BPH LUTS - -tamsulosin 0.4 mg discontinued due to dizziness. finasteride 5 mg daily    OSA on CPAP Assessment &  Plan: Continue cpap.  Seeing pulmonary.    Light headedness Assessment & Plan: Had extensive w/up previously.  Saw neurology.  MRI as outlined previously.  Vestibular rehab.  Symptoms improved.  Not an issue for him now.  Follow.       [name], MD

## 2023-02-20 ENCOUNTER — Encounter: Payer: Self-pay | Admitting: Internal Medicine

## 2023-02-20 NOTE — Assessment & Plan Note (Signed)
Follow cbc.  

## 2023-02-20 NOTE — Assessment & Plan Note (Signed)
Continue losartan and amlodipine.  Pressure as outlined.   Follow pressures.  Follow metabolic panel.

## 2023-02-20 NOTE — Assessment & Plan Note (Signed)
Had extensive w/up previously.  Saw neurology.  MRI as outlined previously.  Vestibular rehab.  Symptoms improved.  Not an issue for him now.  Follow.

## 2023-02-20 NOTE — Assessment & Plan Note (Signed)
On simvastatin.  Low cholesterol diet and exercise.  Follow lipid panel and liver function tests.   

## 2023-02-20 NOTE — Assessment & Plan Note (Signed)
Follow met b and a1c.  

## 2023-02-20 NOTE — Assessment & Plan Note (Signed)
BPH LUTS - -tamsulosin 0.4 mg discontinued due to dizziness. finasteride 5 mg daily

## 2023-02-20 NOTE — Assessment & Plan Note (Signed)
Continue cpap.  Seeing pulmonary.

## 2023-02-22 NOTE — Progress Notes (Unsigned)
02/23/23 10:40 AM   Brian Pearson 26-Feb-1936 [id number]  Referring provider:  [name], [id number] [place],  [place] [address]  Urological history:  1. BPH with LU TS  -PSA 1.24 in 06/2021  -I PSS 16/2 -PVR 39 mL  -tamsulosin 0.4 mg discontinued due to dizziness -finasteride 5 mg daily     2. Nocturia  -improved with use of CPAP     3. ED  -contributing factors of age, BPH, HTN and HLD  -not sexually active     4. Urinary retention  -post-op 03/2020   Benign Prostatic Hypertrophy    HPI: Brian Pearson is a 87 y.o.male who presents today for a 6 week follow up after a trial of oxybutynin XL 10 mg daily for urge incontinence.   I PSS 16/2  PVR 39 mL  He is at goal with oxybutynin XL 10 mg daily.  He has not had any dry eyes, dry mouth, constipation or cognitive issues.  Patient denies any modifying or aggravating factors.  Patient denies any gross hematuria, dysuria or suprapubic/flank pain.  Patient denies any fevers, chills, nausea or vomiting.     IPSS     Row Name 02/23/23 1000         International Prostate Symptom Score   How often have you had the sensation of not emptying your bladder? Less than 1 in 5     How often have you had to urinate less than every two hours? About half the time     How often have you found you stopped and started again several times when you urinated? Less than 1 in 5 times     How often have you found it difficult to postpone urination? About half the time     How often have you had a weak urinary stream? Less than half the time     How often have you had to strain to start urination? Not at All     How many times did you typically get up at night to urinate? 2 Times     Total IPSS Score 12       Quality of Life due to urinary symptoms   If you were to spend the rest of your life with your urinary condition just the way it is now how would you feel about that? Mostly Satisfied                  Score:  1-7 Mild 8-19 Moderate 20-35 Severe   PMH: Past Medical History:  Diagnosis Date   Arthritis    BPH with obstruction/lower urinary tract symptoms    GERD (gastroesophageal reflux disease)    Hyperlipidemia    taken off simvastatin last year   Hypertension    Obesity    Pneumonia    hospitalized    Sleep apnea    uses cpap machine    Urinary frequency    Urine incontinence     Surgical History: Past Surgical History:  Procedure Laterality Date   COLONOSCOPY WITH PROPOFOL N/A 02/09/2016   Procedure: COLONOSCOPY WITH PROPOFOL;  Surgeon: Hulen Luster, MD;  Location[facility] ENDOSCOPY;  Service: Gastroenterology;  Laterality: N/A;   ESOPHAGOGASTRODUODENOSCOPY (EGD) WITH PROPOFOL N/A 02/09/2016   Procedure: ESOPHAGOGASTRODUODENOSCOPY (EGD) WITH PROPOFOL;  Surgeon: Hulen Luster, MD;  Location: [facility] ENDOSCOPY;  Service: Gastroenterology;  Laterality: N/A;   HERNIA REPAIR     1990's - 3 surgeries   KNEE ARTHROPLASTY Left  04/01/2021   Procedure: COMPUTER ASSISTED TOTAL KNEE ARTHROPLASTY;  Surgeon: Dereck Leep, MD;  Location: [facility] ORS;  Service: Orthopedics;  Laterality: Left;    Home Medications:  Allergies as of 02/23/2023       Reactions   Oxycodone Other (See Comments)   Patient felt as if he was "burning on the insides" and refuses to take again   Amoxicillin Rash        Medication List        Accurate as of February 23, 2023 10:40 AM. If you have any questions, ask your nurse or doctor.          Alpha Lipoic Acid 200 MG Caps Take 200 mg by mouth in the morning, at noon, and at bedtime.   amLODipine 10 MG tablet Commonly known as: NORVASC Take 1 tablet by mouth once daily   COQ10 PO Take 1 capsule by mouth daily.   Cyanocobalamin 5000 MCG Subl Place 5,000 mcg under the tongue daily.   finasteride 5 MG tablet Commonly known as: PROSCAR Take 1 tablet by mouth once daily   FISH OIL PO Take 1 capsule by mouth daily.   losartan 50 MG  tablet Commonly known as: COZAAR Take 1 tablet by mouth once daily   Multi-Vitamins Tabs Take 1 tablet by mouth daily.   omeprazole 20 MG capsule Commonly known as: PRILOSEC Take 1 capsule by mouth once daily   oxybutynin 10 MG 24 hr tablet Commonly known as: DITROPAN-XL Take 1 tablet (10 mg total) by mouth daily.   simvastatin 20 MG tablet Commonly known as: ZOCOR TAKE 1 TABLET BY MOUTH AT BEDTIME. APPOINTMENT NEEDED FOR FUTURE REFILLS        Allergies:  Allergies  Allergen Reactions   Oxycodone Other (See Comments)    Patient felt as if he was "burning on the insides" and refuses to take again   Amoxicillin Rash    Family History: Family History  Problem Relation Age of Onset   Arthritis Mother    Heart disease Mother        CHF   Cancer Father        Prostate   Dementia Father    Alzheimer's disease Father    Hypotension Sister    Kidney cancer Neg Hx    Bladder Cancer Neg Hx     Social History:  reports that he has never smoked. He has never used smokeless tobacco. He reports current alcohol use. He reports that he does not use drugs.   Physical Exam: BP (!) 155/73   Pulse 75   Ht 6' (1.829 m)   Wt 224 lb (101.6 kg)   BMI 30.38 kg/m   Constitutional:  Well nourished. Alert and oriented, No acute distress. HEENT: [place] AT, moist mucus membranes.  Trachea midline Cardiovascular: No clubbing, cyanosis, or edema. Respiratory: Normal respiratory effort, no increased work of breathing. Neurologic: Grossly intact, no focal deficits, moving all 4 extremities. Psychiatric: Normal mood and affect.   Laboratory Data: Serum creatinine (01/2023) 0.94 Total cholesterol (01/2023) 152 Triglycerides (01/2023) 173.0 Hemoglobin A1c (01/2023) 6.0 I have reviewed the labs.    Pertinent Imaging:  02/23/23 10:29  Scan Result 39     Assessment & Plan:    1. BPH with LUTS -aged out of screening -PVR < 300 cc -symptoms - frequency, urge incontinence and weak  stream -continue conservative management, avoiding bladder irritants and timed voiding's -continue finasteride 5 mg daily    2. Urge incontinence -At  goal with oxybutynin XL 10 mg daily -Continue oxybutynin XL 10 mg daily  Return in about 1 year (around 02/23/2024) for IPSS and PVR.  Giavonna Pflum, [place] [address] [place], [place] [address] [phone]

## 2023-02-23 ENCOUNTER — Ambulatory Visit (INDEPENDENT_AMBULATORY_CARE_PROVIDER_SITE_OTHER): Payer: HMO | Admitting: Urology

## 2023-02-23 ENCOUNTER — Encounter: Payer: Self-pay | Admitting: Urology

## 2023-02-23 DIAGNOSIS — R350 Frequency of micturition: Secondary | ICD-10-CM

## 2023-02-23 DIAGNOSIS — N3941 Urge incontinence: Secondary | ICD-10-CM | POA: Diagnosis not present

## 2023-02-23 DIAGNOSIS — N138 Other obstructive and reflux uropathy: Secondary | ICD-10-CM

## 2023-02-23 DIAGNOSIS — N401 Enlarged prostate with lower urinary tract symptoms: Secondary | ICD-10-CM | POA: Diagnosis not present

## 2023-02-23 DIAGNOSIS — R3912 Poor urinary stream: Secondary | ICD-10-CM | POA: Diagnosis not present

## 2023-02-23 LAB — BLADDER SCAN AMB NON-IMAGING: Scan Result: 39

## 2023-02-23 MED ORDER — OXYBUTYNIN CHLORIDE ER 10 MG PO TB24: 10.0000 mg | ORAL_TABLET | Freq: Every day | ORAL | 3 refills | Status: DC

## 2023-03-17 DIAGNOSIS — M6250 Muscle wasting and atrophy, not elsewhere classified, unspecified site: Secondary | ICD-10-CM | POA: Diagnosis not present

## 2023-03-17 DIAGNOSIS — R200 Anesthesia of skin: Secondary | ICD-10-CM | POA: Diagnosis not present

## 2023-03-17 DIAGNOSIS — G5603 Carpal tunnel syndrome, bilateral upper limbs: Secondary | ICD-10-CM | POA: Diagnosis not present

## 2023-03-28 ENCOUNTER — Other Ambulatory Visit: Payer: Self-pay

## 2023-03-28 ENCOUNTER — Telehealth: Payer: Self-pay | Admitting: Internal Medicine

## 2023-03-28 DIAGNOSIS — R1310 Dysphagia, unspecified: Secondary | ICD-10-CM

## 2023-03-28 DIAGNOSIS — E785 Hyperlipidemia, unspecified: Secondary | ICD-10-CM

## 2023-03-28 MED ORDER — OMEPRAZOLE 20 MG PO CPDR: 20.0000 mg | DELAYED_RELEASE_CAPSULE | Freq: Every day | ORAL | 3 refills | Status: DC

## 2023-03-28 MED ORDER — AMLODIPINE BESYLATE 10 MG PO TABS: 10.0000 mg | ORAL_TABLET | Freq: Every day | ORAL | 2 refills | Status: DC

## 2023-03-28 MED ORDER — SIMVASTATIN 20 MG PO TABS: ORAL_TABLET | ORAL | 3 refills | Status: DC

## 2023-03-28 MED ORDER — LOSARTAN POTASSIUM 50 MG PO TABS: 50.0000 mg | ORAL_TABLET | Freq: Every day | ORAL | 2 refills | Status: DC

## 2023-03-28 NOTE — Telephone Encounter (Addendum)
Prescription Request  03/28/2023  LOV: 02/16/2023  What is the name of the medication or equipment? All of patients prescriptions to [facility] need to be refilled, per patient's wife  Have you contacted your pharmacy to request a refill? Yes   Which pharmacy would you like this sent to?  [facility] - [place], [place] - [address] [place] [address] Phone: [phone] Fax: [phone]    Patient notified that their request is being sent to the clinical staff for review and that they should receive a response within 2 business days.   Please advise at [facility] [phone]

## 2023-03-28 NOTE — Telephone Encounter (Signed)
Prescription(s) have been sent

## 2023-04-20 DIAGNOSIS — M25561 Pain in right knee: Secondary | ICD-10-CM | POA: Diagnosis not present

## 2023-04-20 DIAGNOSIS — M1711 Unilateral primary osteoarthritis, right knee: Secondary | ICD-10-CM | POA: Diagnosis not present

## 2023-04-20 DIAGNOSIS — G8929 Other chronic pain: Secondary | ICD-10-CM | POA: Diagnosis not present

## 2023-05-04 DIAGNOSIS — D3131 Benign neoplasm of right choroid: Secondary | ICD-10-CM | POA: Diagnosis not present

## 2023-05-04 DIAGNOSIS — H43813 Vitreous degeneration, bilateral: Secondary | ICD-10-CM | POA: Diagnosis not present

## 2023-05-04 DIAGNOSIS — H2511 Age-related nuclear cataract, right eye: Secondary | ICD-10-CM | POA: Diagnosis not present

## 2023-05-04 DIAGNOSIS — H2512 Age-related nuclear cataract, left eye: Secondary | ICD-10-CM | POA: Diagnosis not present

## 2023-05-09 NOTE — Progress Notes (Unsigned)
05/10/23 10:07 AM   Brian Pearson [birth year] [id number]  Referring provider:  Dale Allendale, MD [address] [place],  [place] [address]  Urological history:  1. BPH with LU TS  -PSA 1.24 in 06/2021  -tamsulosin 0.4 mg discontinued due to dizziness -finasteride 5 mg daily     2. Nocturia  -improved with use of CPAP     3. ED  -contributing factors of age, BPH, HTN and HLD  -not sexually active     4. Urinary retention  -post-op 03/2020   5. Urge incontinence -Contributing factors of age, hypertension, sleep apnea, BPH and hyperglycemia -Oxybutynin XL 10 mg daily  Follow-up and Benign Prostatic Hypertrophy    HPI: Brian Pearson is a 87 y.o.male who presents today for yearly follow up.    I PSS 12/0  PVR 3 mL   He has some urinary leakage but it is very mild.  He states it is much improved since starting the oxybutynin XL 10 mg daily.  He denies any bothersome dry eye, dry mouth or constipation.  He also has not noticed any cognitive defects while being on the medication.  Patient denies any modifying or aggravating factors.  Patient denies any gross hematuria, dysuria or suprapubic/flank pain.  Patient denies any fevers, chills, nausea or vomiting.       IPSS     Row Name 05/10/23 0900         International Prostate Symptom Score   How often have you had the sensation of not emptying your bladder? Less than half the time     How often have you had to urinate less than every two hours? Less than 1 in 5 times     How often have you found you stopped and started again several times when you urinated? Less than half the time     How often have you found it difficult to postpone urination? More than half the time     How often have you had a weak urinary stream? Less than 1 in 5 times     How often have you had to strain to start urination? Not at All     How many times did you typically get up at night to urinate? 2 Times     Total  IPSS Score 12       Quality of Life due to urinary symptoms   If you were to spend the rest of your life with your urinary condition just the way it is now how would you feel about that? Delighted                 Score:  1-7 Mild 8-19 Moderate 20-35 Severe   PMH: Past Medical History:  Diagnosis Date   Arthritis    BPH with obstruction/lower urinary tract symptoms    GERD (gastroesophageal reflux disease)    Hyperlipidemia    taken off simvastatin last year   Hypertension    Obesity    Pneumonia    hospitalized    Sleep apnea    uses cpap machine    Urinary frequency    Urine incontinence     Surgical History: Past Surgical History:  Procedure Laterality Date   COLONOSCOPY WITH PROPOFOL N/A 02/09/2016   Procedure: COLONOSCOPY WITH PROPOFOL;  Surgeon: Wallace Cullens, MD;  Location: [facility] ENDOSCOPY;  Service: Gastroenterology;  Laterality: N/A;   ESOPHAGOGASTRODUODENOSCOPY (EGD) WITH PROPOFOL N/A 02/09/2016   Procedure: ESOPHAGOGASTRODUODENOSCOPY (EGD) WITH PROPOFOL;  Surgeon: Wallace Cullens, MD;  Location: [facility] ENDOSCOPY;  Service: Gastroenterology;  Laterality: N/A;   HERNIA REPAIR     1990's - 3 surgeries   KNEE ARTHROPLASTY Left 04/01/2021   Procedure: COMPUTER ASSISTED TOTAL KNEE ARTHROPLASTY;  Surgeon: Donato Heinz, MD;  Location: [facility] ORS;  Service: Orthopedics;  Laterality: Left;    Home Medications:  Allergies as of 05/10/2023       Reactions   Oxycodone Other (See Comments)   Patient felt as if he was "burning on the insides" and refuses to take again   Amoxicillin Rash        Medication List        Accurate as of May 10, 2023 10:07 AM. If you have any questions, ask your nurse or doctor.          Alpha Lipoic Acid 200 MG Caps Take 200 mg by mouth in the morning, at noon, and at bedtime.   amLODipine 10 MG tablet Commonly known as: NORVASC Take 1 tablet (10 mg total) by mouth daily.   COQ10 PO Take 1 capsule by mouth daily.    Cyanocobalamin 5000 MCG Subl Place 5,000 mcg under the tongue daily.   finasteride 5 MG tablet Commonly known as: PROSCAR Take 1 tablet (5 mg total) by mouth daily.   FISH OIL PO Take 1 capsule by mouth daily.   losartan 50 MG tablet Commonly known as: COZAAR Take 1 tablet (50 mg total) by mouth daily.   Multi-Vitamins Tabs Take 1 tablet by mouth daily.   omeprazole 20 MG capsule Commonly known as: PRILOSEC Take 1 capsule (20 mg total) by mouth daily.   oxybutynin 10 MG 24 hr tablet Commonly known as: DITROPAN-XL Take 1 tablet (10 mg total) by mouth daily.   simvastatin 20 MG tablet Commonly known as: ZOCOR TAKE 1 TABLET BY MOUTH AT BEDTIME. APPOINTMENT NEEDED FOR FUTURE REFILLS        Allergies:  Allergies  Allergen Reactions   Oxycodone Other (See Comments)    Patient felt as if he was "burning on the insides" and refuses to take again   Amoxicillin Rash    Family History: Family History  Problem Relation Age of Onset   Arthritis Mother    Heart disease Mother        CHF   Cancer Father        Prostate   Dementia Father    Alzheimer's disease Father    Hypotension Sister    Kidney cancer Neg Hx    Bladder Cancer Neg Hx     Social History:  reports that he has never smoked. He has never used smokeless tobacco. He reports current alcohol use. He reports that he does not use drugs.   Physical Exam: BP (!) 157/74   Ht 6' (1.829 m)   Wt 220 lb (99.8 kg)   BMI 29.84 kg/m   Constitutional:  Well nourished. Alert and oriented, No acute distress. HEENT: [place] AT, moist mucus membranes.  Trachea midline Cardiovascular: No clubbing, cyanosis, or edema. Respiratory: Normal respiratory effort, no increased work of breathing. Neurologic: Grossly intact, no focal deficits, moving all 4 extremities. Psychiatric: Normal mood and affect.   Laboratory Data: N/A    Pertinent Imaging:  05/10/23 10:05  Scan Result 3 ml     Assessment & Plan:    1. BPH  with LUTS -aged out of screening -PVR < 300 cc -symptoms - mild urge incontinence -continue conservative management, avoiding bladder irritants  and timed voiding's -continue finasteride 5 mg daily    2. Urge incontinence -At goal with oxybutynin XL 10 mg daily -Continue oxybutynin XL 10 mg daily  Return in about 1 year (around 05/09/2024) for IPSS and PVR.  Cloretta Ned  [facility] [address], [address] [place], [place] [address] [phone]

## 2023-05-10 ENCOUNTER — Encounter: Payer: Self-pay | Admitting: Urology

## 2023-05-10 ENCOUNTER — Ambulatory Visit (INDEPENDENT_AMBULATORY_CARE_PROVIDER_SITE_OTHER): Payer: HMO | Admitting: Urology

## 2023-05-10 ENCOUNTER — Ambulatory Visit: Payer: PPO | Admitting: Urology

## 2023-05-10 VITALS — BP 153/67 | HR 64 | Ht 72.0 in | Wt 220.0 lb

## 2023-05-10 DIAGNOSIS — N3941 Urge incontinence: Secondary | ICD-10-CM | POA: Diagnosis not present

## 2023-05-10 DIAGNOSIS — N401 Enlarged prostate with lower urinary tract symptoms: Secondary | ICD-10-CM

## 2023-05-10 DIAGNOSIS — N138 Other obstructive and reflux uropathy: Secondary | ICD-10-CM

## 2023-05-10 LAB — BLADDER SCAN AMB NON-IMAGING: Scan Result: 3

## 2023-05-10 MED ORDER — FINASTERIDE 5 MG PO TABS
5.0000 mg | ORAL_TABLET | Freq: Every day | ORAL | 3 refills | Status: DC
Start: 2023-05-10 — End: 2024-05-09

## 2023-05-10 NOTE — Progress Notes (Signed)
Brian Pearson presents for an office/procedure visit. BP today is 153 67 . He is complaint with BP medication. Greater than 140/90. Provider  notified. Pt advised to follow up with PCP . Pt voiced understanding.

## 2023-05-12 DIAGNOSIS — X32XXXA Exposure to sunlight, initial encounter: Secondary | ICD-10-CM | POA: Diagnosis not present

## 2023-05-12 DIAGNOSIS — D2272 Melanocytic nevi of left lower limb, including hip: Secondary | ICD-10-CM | POA: Diagnosis not present

## 2023-05-12 DIAGNOSIS — G4733 Obstructive sleep apnea (adult) (pediatric): Secondary | ICD-10-CM | POA: Diagnosis not present

## 2023-05-12 DIAGNOSIS — D2271 Melanocytic nevi of right lower limb, including hip: Secondary | ICD-10-CM | POA: Diagnosis not present

## 2023-05-12 DIAGNOSIS — D2261 Melanocytic nevi of right upper limb, including shoulder: Secondary | ICD-10-CM | POA: Diagnosis not present

## 2023-05-12 DIAGNOSIS — L570 Actinic keratosis: Secondary | ICD-10-CM | POA: Diagnosis not present

## 2023-05-12 DIAGNOSIS — D2262 Melanocytic nevi of left upper limb, including shoulder: Secondary | ICD-10-CM | POA: Diagnosis not present

## 2023-05-12 DIAGNOSIS — D225 Melanocytic nevi of trunk: Secondary | ICD-10-CM | POA: Diagnosis not present

## 2023-05-12 DIAGNOSIS — L821 Other seborrheic keratosis: Secondary | ICD-10-CM | POA: Diagnosis not present

## 2023-05-20 ENCOUNTER — Telehealth: Payer: Self-pay

## 2023-05-20 NOTE — Telephone Encounter (Signed)
Called patient and wife to confirm patient doing ok. Patient could not describe the "foolish" feeling he is having. Not all the time. Denies being dizzy, light headed, faint, etc. Advised to be evaluated ASAP if any acute symptoms- they agreed. Patient has been scheduled to see Dr Lorin Picket Monday at 10:30

## 2023-05-20 NOTE — Telephone Encounter (Signed)
Patient's wife, Japeth Dulski, called to state Dr. Dale [place] wanted patient to check his blood pressure at home, and he has for the last couple of weeks.  Steward Drone states patient's blood pressure was 147/74 after patient had been sitting reading a while.  Steward Drone states patient's blood pressure has been holding in the high 130's and going into the 140's.  Steward Drone states patient's head has been feeling foolish.  I asked Steward Drone if patient was feeling dizzy and she said he did not say he was dizzy, but he just said his head felt foolish and she states she is wondering if this may have anything to do with his blood pressure.

## 2023-05-23 ENCOUNTER — Ambulatory Visit (INDEPENDENT_AMBULATORY_CARE_PROVIDER_SITE_OTHER): Payer: HMO | Admitting: Internal Medicine

## 2023-05-23 VITALS — BP 136/72 | HR 76 | Temp 97.9°F | Resp 16 | Ht 73.0 in | Wt 219.6 lb

## 2023-05-23 DIAGNOSIS — M25561 Pain in right knee: Secondary | ICD-10-CM | POA: Diagnosis not present

## 2023-05-23 DIAGNOSIS — R739 Hyperglycemia, unspecified: Secondary | ICD-10-CM

## 2023-05-23 DIAGNOSIS — I10 Essential (primary) hypertension: Secondary | ICD-10-CM

## 2023-05-23 DIAGNOSIS — G4733 Obstructive sleep apnea (adult) (pediatric): Secondary | ICD-10-CM | POA: Diagnosis not present

## 2023-05-23 DIAGNOSIS — E785 Hyperlipidemia, unspecified: Secondary | ICD-10-CM | POA: Diagnosis not present

## 2023-05-23 NOTE — Progress Notes (Signed)
Subjective:    Patient ID: Brian Pearson, male    DOB: Feb 05, 1936, 87 y.o.   MRN: [id number]  Patient here for  Chief Complaint  Patient presents with   Hypertension    HPI Here as a work in to discuss elevated blood pressure. He is accompanied by his wife. On losartan and amlodipine.  Was evaluated 05/10/23 - urology.  Continues on oxybutynin for urge incontinence.  Continues on finasteride and tamsulosin for BPH with LUTS.  Blood pressure elevated at that visit - 153/67.  He has been monitoring since that visit.  Reviewed outside blood pressure readings.  Most averaging - 122-130s/60s.  No chest pain reported.  Breathing stable.  No increased cough or congestion.  Saw ortho 04/20/23 - right knee pain. S/p cortisone injection.  Helped.  Due to see Dr Melanie Crazier 06/10/23.    Past Medical History:  Diagnosis Date   Arthritis    BPH with obstruction/lower urinary tract symptoms    GERD (gastroesophageal reflux disease)    Hyperlipidemia    taken off simvastatin last year   Hypertension    Obesity    Pneumonia    hospitalized    Sleep apnea    uses cpap machine    Urinary frequency    Urine incontinence    Past Surgical History:  Procedure Laterality Date   COLONOSCOPY WITH PROPOFOL N/A 02/09/2016   Procedure: COLONOSCOPY WITH PROPOFOL;  Surgeon: Wallace Cullens, MD;  Location: [facility] ENDOSCOPY;  Service: Gastroenterology;  Laterality: N/A;   ESOPHAGOGASTRODUODENOSCOPY (EGD) WITH PROPOFOL N/A 02/09/2016   Procedure: ESOPHAGOGASTRODUODENOSCOPY (EGD) WITH PROPOFOL;  Surgeon: Wallace Cullens, MD;  Location: [facility] ENDOSCOPY;  Service: Gastroenterology;  Laterality: N/A;   HERNIA REPAIR     1990's - 3 surgeries   KNEE ARTHROPLASTY Left 04/01/2021   Procedure: COMPUTER ASSISTED TOTAL KNEE ARTHROPLASTY;  Surgeon: Donato Heinz, MD;  Location: [facility] ORS;  Service: Orthopedics;  Laterality: Left;   Family History  Problem Relation Age of Onset   Arthritis Mother    Heart disease Mother        CHF    Cancer Father        Prostate   Dementia Father    Alzheimer's disease Father    Hypotension Sister    Kidney cancer Neg Hx    Bladder Cancer Neg Hx    Social History   Socioeconomic History   Marital status: Married    Spouse name: Not on file   Number of children: Not on file   Years of education: Not on file   Highest education level: Not on file  Occupational History   Not on file  Tobacco Use   Smoking status: Never   Smokeless tobacco: Never  Vaping Use   Vaping Use: Never used  Substance and Sexual Activity   Alcohol use: Yes    Alcohol/week: 0.0 standard drinks of alcohol    Comment: rarely    Drug use: No   Sexual activity: Yes  Other Topics Concern   Not on file  Social History Narrative   Lives in [place].      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   Social Determinants of Health   Financial Resource Strain: Low Risk  (10/11/2022)  Overall Financial Resource Strain (CARDIA)    Difficulty of Paying Living Expenses: Not hard at all  Food Insecurity: No Food Insecurity (10/11/2022)   Hunger Vital Sign    Worried About Running Out of Food in the Last Year: Never true    Ran Out of Food in the Last Year: Never true  Transportation Needs: No Transportation Needs (10/11/2022)   PRAPARE - Administrator, Civil Service (Medical): No    Lack of Transportation (Non-Medical): No  Physical Activity: Sufficiently Active (10/11/2022)   Exercise Vital Sign    Days of Exercise per Week: 5 days    Minutes of Exercise per Session: 30 min  Stress: No Stress Concern Present (10/11/2022)   [employer] of Occupational Health - Occupational Stress Questionnaire    Feeling of Stress : Not at all  Social Connections: Moderately Integrated (10/11/2022)   Social Connection and Isolation Panel [NHANES]    Frequency  of Communication with Friends and Family: Three times a week    Frequency of Social Gatherings with Friends and Family: More than three times a week    Attends Religious Services: 1 to 4 times per year    Active Member of [employer] or Organizations: No    Attends Banker Meetings: Never    Marital Status: Married     Review of Systems  Constitutional:  Negative for appetite change and unexpected weight change.  HENT:  Negative for congestion and sinus pressure.   Respiratory:  Negative for cough, chest tightness and shortness of breath.   Cardiovascular:  Negative for chest pain, palpitations and leg swelling.  Gastrointestinal:  Negative for abdominal pain, diarrhea, nausea and vomiting.  Genitourinary:  Negative for difficulty urinating and dysuria.  Musculoskeletal:  Negative for myalgias.       Knee better.   Skin:  Negative for color change and rash.  Neurological:  Negative for dizziness and headaches.  Psychiatric/Behavioral:  Negative for agitation and dysphoric mood.        Objective:     BP 136/72   Pulse 76   Temp 97.9 F (36.6 C)   Resp 16   Ht 6\' 1"  (1.854 m)   Wt 219 lb 9.6 oz (99.6 kg)   SpO2 98%   BMI 28.97 kg/m  Wt Readings from Last 3 Encounters:  05/23/23 219 lb 9.6 oz (99.6 kg)  05/10/23 220 lb (99.8 kg)  02/23/23 224 lb (101.6 kg)    Physical Exam Vitals reviewed.  Constitutional:      General: He is not in acute distress.    Appearance: Normal appearance. He is well-developed.  HENT:     Head: Normocephalic and atraumatic.     Right Ear: External ear normal.     Left Ear: External ear normal.  Eyes:     General: No scleral icterus.       Right eye: No discharge.        Left eye: No discharge.     Conjunctiva/sclera: Conjunctivae normal.  Cardiovascular:     Rate and Rhythm: Normal rate and regular rhythm.  Pulmonary:     Effort: Pulmonary effort is normal. No respiratory distress.     Breath sounds: Normal breath sounds.   Abdominal:     General: Bowel sounds are normal.     Palpations: Abdomen is soft.     Tenderness: There is no abdominal tenderness.  Musculoskeletal:        General: No swelling or tenderness.  Cervical back: Neck supple. No tenderness.  Lymphadenopathy:     Cervical: No cervical adenopathy.  Skin:    Findings: No erythema or rash.  Neurological:     Mental Status: He is alert.  Psychiatric:        Mood and Affect: Mood normal.        Behavior: Behavior normal.      Outpatient Encounter Medications as of 05/23/2023  Medication Sig   Alpha Lipoic Acid 200 MG CAPS Take 200 mg by mouth in the morning, at noon, and at bedtime.   amLODipine (NORVASC) 10 MG tablet Take 1 tablet (10 mg total) by mouth daily.   Coenzyme Q10 (COQ10 PO) Take 1 capsule by mouth daily.   Cyanocobalamin 5000 MCG SUBL Place 5,000 mcg under the tongue daily.   finasteride (PROSCAR) 5 MG tablet Take 1 tablet (5 mg total) by mouth daily.   losartan (COZAAR) 50 MG tablet Take 1 tablet (50 mg total) by mouth daily.   Multiple Vitamin (MULTI-VITAMINS) TABS Take 1 tablet by mouth daily.   Omega-3 Fatty Acids (FISH OIL PO) Take 1 capsule by mouth daily.   omeprazole (PRILOSEC) 20 MG capsule Take 1 capsule (20 mg total) by mouth daily.   oxybutynin (DITROPAN-XL) 10 MG 24 hr tablet Take 1 tablet (10 mg total) by mouth daily.   simvastatin (ZOCOR) 20 MG tablet TAKE 1 TABLET BY MOUTH AT BEDTIME. APPOINTMENT NEEDED FOR FUTURE REFILLS   No facility-administered encounter medications on file as of 05/23/2023.     Lab Results  Component Value Date   WBC 6.9 09/23/2022   HGB 13.5 09/23/2022   HCT 40.8 09/23/2022   PLT 213 09/23/2022   GLUCOSE 98 02/14/2023   CHOL 152 02/14/2023   TRIG 173.0 (H) 02/14/2023   HDL 52.90 02/14/2023   LDLCALC 64 02/14/2023   ALT 13 02/14/2023   AST 17 02/14/2023   NA 140 02/14/2023   K 4.5 02/14/2023   CL 105 02/14/2023   CREATININE 0.94 02/14/2023   BUN 15 02/14/2023   CO2 27  02/14/2023   TSH 3.79 05/31/2022   PSA 1.24 06/26/2021   INR 1.0 03/23/2021   HGBA1C 6.0 02/14/2023   MICROALBUR 0.7 10/22/2014    No results found.     Assessment & Plan:  Essential hypertension Assessment & Plan: Continue losartan and amlodipine.  Pressure as outlined.   Follow pressures.  Follow metabolic panel. Hold on changing.  Follow.    Right knee pain, unspecified chronicity Assessment & Plan: Saw ortho as outlined.  S/p injection.  Helped.    OSA on CPAP Assessment & Plan: Continue cpap.  Seeing pulmonary.    Hyperlipidemia, unspecified hyperlipidemia type Assessment & Plan: On simvastatin.  Low cholesterol diet and exercise.  Follow lipid panel and liver function tests.     Hyperglycemia Assessment & Plan: Follow met b and a1c.       Dale [place], MD

## 2023-05-29 ENCOUNTER — Encounter: Payer: Self-pay | Admitting: Internal Medicine

## 2023-05-29 NOTE — Assessment & Plan Note (Signed)
Saw ortho as outlined.  S/p injection.  Helped.

## 2023-05-29 NOTE — Assessment & Plan Note (Signed)
Continue cpap.  Seeing pulmonary.  

## 2023-05-29 NOTE — Assessment & Plan Note (Signed)
Continue losartan and amlodipine.  Pressure as outlined.   Follow pressures.  Follow metabolic panel. Hold on changing.  Follow.

## 2023-05-29 NOTE — Assessment & Plan Note (Signed)
On simvastatin.  Low cholesterol diet and exercise.  Follow lipid panel and liver function tests.   

## 2023-05-29 NOTE — Assessment & Plan Note (Signed)
Follow met b and a1c.  

## 2023-06-10 DIAGNOSIS — D3131 Benign neoplasm of right choroid: Secondary | ICD-10-CM | POA: Diagnosis not present

## 2023-06-14 ENCOUNTER — Other Ambulatory Visit (INDEPENDENT_AMBULATORY_CARE_PROVIDER_SITE_OTHER): Payer: HMO

## 2023-06-14 DIAGNOSIS — I10 Essential (primary) hypertension: Secondary | ICD-10-CM | POA: Diagnosis not present

## 2023-06-14 DIAGNOSIS — R739 Hyperglycemia, unspecified: Secondary | ICD-10-CM | POA: Diagnosis not present

## 2023-06-14 DIAGNOSIS — E785 Hyperlipidemia, unspecified: Secondary | ICD-10-CM

## 2023-06-14 LAB — BASIC METABOLIC PANEL
BUN: 14 mg/dL (ref 6–23)
CO2: 26 mEq/L (ref 19–32)
Calcium: 9.3 mg/dL (ref 8.4–10.5)
Chloride: 106 mEq/L (ref 96–112)
Creatinine, Ser: 0.9 mg/dL (ref 0.40–1.50)
GFR: 78.02 mL/min (ref >60.00)
Glucose, Bld: 93 mg/dL (ref 70–99)
Potassium: 4.1 mEq/L (ref 3.5–5.1)
Sodium: 140 mEq/L (ref 135–145)

## 2023-06-14 LAB — CBC WITH DIFFERENTIAL/PLATELET
Basophils Absolute: 0 10*3/uL (ref 0.0–0.1)
Basophils Relative: 0.7 % (ref 0.0–3.0)
Eosinophils Absolute: 0.2 10*3/uL (ref 0.0–0.7)
Eosinophils Relative: 3.3 % (ref 0.0–5.0)
HCT: 40.9 % (ref 39.0–52.0)
Hemoglobin: 13.5 g/dL (ref 13.0–17.0)
Lymphocytes Relative: 33.9 % (ref 12.0–46.0)
Lymphs Abs: 2 10*3/uL (ref 0.7–4.0)
MCHC: 32.9 g/dL (ref 30.0–36.0)
MCV: 93.8 fl (ref 78.0–100.0)
Monocytes Absolute: 0.6 10*3/uL (ref 0.1–1.0)
Monocytes Relative: 11 % (ref 3.0–12.0)
Neutro Abs: 3 10*3/uL (ref 1.4–7.7)
Neutrophils Relative %: 51.1 % (ref 43.0–77.0)
Platelets: 221 10*3/uL (ref 150.0–400.0)
RBC: 4.36 Mil/uL (ref 4.22–5.81)
RDW: 13.8 % (ref 11.5–15.5)
WBC: 5.8 10*3/uL (ref 4.0–10.5)

## 2023-06-14 LAB — LIPID PANEL
Cholesterol: 138 mg/dL (ref 0–200)
HDL: 48.4 mg/dL (ref >39.00)
LDL Cholesterol: 63 mg/dL (ref 0–99)
NonHDL: 90.08
Total CHOL/HDL Ratio: 3
Triglycerides: 133 mg/dL (ref 0.0–149.0)
VLDL: 26.6 mg/dL (ref 0.0–40.0)

## 2023-06-14 LAB — TSH: TSH: 3.3 u[IU]/mL (ref 0.35–5.50)

## 2023-06-14 LAB — HEPATIC FUNCTION PANEL
ALT: 13 U/L (ref 0–53)
AST: 18 U/L (ref 0–37)
Albumin: 4.2 g/dL (ref 3.5–5.2)
Alkaline Phosphatase: 77 U/L (ref 39–117)
Bilirubin, Direct: 0.1 mg/dL (ref 0.0–0.3)
Total Bilirubin: 0.7 mg/dL (ref 0.2–1.2)
Total Protein: 6.4 g/dL (ref 6.0–8.3)

## 2023-06-14 LAB — HEMOGLOBIN A1C: Hgb A1c MFr Bld: 5.9 % (ref 4.6–6.5)

## 2023-06-20 ENCOUNTER — Ambulatory Visit (INDEPENDENT_AMBULATORY_CARE_PROVIDER_SITE_OTHER): Payer: HMO | Admitting: Internal Medicine

## 2023-06-20 ENCOUNTER — Encounter: Payer: Self-pay | Admitting: Internal Medicine

## 2023-06-20 VITALS — BP 132/68 | HR 65 | Temp 98.1°F | Resp 16 | Ht 73.0 in | Wt 222.5 lb

## 2023-06-20 DIAGNOSIS — D649 Anemia, unspecified: Secondary | ICD-10-CM | POA: Diagnosis not present

## 2023-06-20 DIAGNOSIS — N400 Enlarged prostate without lower urinary tract symptoms: Secondary | ICD-10-CM

## 2023-06-20 DIAGNOSIS — G4733 Obstructive sleep apnea (adult) (pediatric): Secondary | ICD-10-CM | POA: Diagnosis not present

## 2023-06-20 DIAGNOSIS — E785 Hyperlipidemia, unspecified: Secondary | ICD-10-CM | POA: Diagnosis not present

## 2023-06-20 DIAGNOSIS — R739 Hyperglycemia, unspecified: Secondary | ICD-10-CM | POA: Diagnosis not present

## 2023-06-20 DIAGNOSIS — I10 Essential (primary) hypertension: Secondary | ICD-10-CM | POA: Diagnosis not present

## 2023-06-20 NOTE — Progress Notes (Signed)
Subjective:    Patient ID: Brian Pearson, male    DOB: [birth year], 87 y.o.   MRN: [id number]  Patient here for  Chief Complaint  Patient presents with   Hypertension    HPI Here to follow up regarding hypercholesterolemia and hypertension.   On losartan and amlodipine.  Was evaluated 05/10/23 - urology.  Continues on oxybutynin for urge incontinence.  Continues on finasteride and tamsulosin for BPH with LUTS.  Blood pressure elevated at that visit - 153/67.  F/u blood pressures - ok.  Saw ortho 04/20/23 - right knee pain. S/p cortisone injection. Helped.  He reports he is doing well.  No chest pain.  Tries to stay active.  Breathing stable.  Blood pressure has been doing well at home - 120s-mid 130s/60-70s.     Past Medical History:  Diagnosis Date   Arthritis    BPH with obstruction/lower urinary tract symptoms    GERD (gastroesophageal reflux disease)    Hyperlipidemia    taken off simvastatin last year   Hypertension    Obesity    Pneumonia    hospitalized    Sleep apnea    uses cpap machine    Urinary frequency    Urine incontinence    Past Surgical History:  Procedure Laterality Date   COLONOSCOPY WITH PROPOFOL N/A 02/09/2016   Procedure: COLONOSCOPY WITH PROPOFOL;  Surgeon: Wallace Cullens, MD;  Location: [facility] ENDOSCOPY;  Service: Gastroenterology;  Laterality: N/A;   ESOPHAGOGASTRODUODENOSCOPY (EGD) WITH PROPOFOL N/A 02/09/2016   Procedure: ESOPHAGOGASTRODUODENOSCOPY (EGD) WITH PROPOFOL;  Surgeon: Wallace Cullens, MD;  Location: [facility] ENDOSCOPY;  Service: Gastroenterology;  Laterality: N/A;   HERNIA REPAIR     1990's - 3 surgeries   KNEE ARTHROPLASTY Left 04/01/2021   Procedure: COMPUTER ASSISTED TOTAL KNEE ARTHROPLASTY;  Surgeon: Donato Heinz, MD;  Location: [facility] ORS;  Service: Orthopedics;  Laterality: Left;   Family History  Problem Relation Age of Onset   Arthritis Mother    Heart disease Mother        CHF   Cancer Father        Prostate   Dementia Father     Alzheimer's disease Father    Hypotension Sister    Kidney cancer Neg Hx    Bladder Cancer Neg Hx    Social History   Socioeconomic History   Marital status: Married    Spouse name: Not on file   Number of children: Not on file   Years of education: Not on file   Highest education level: Not on file  Occupational History   Not on file  Tobacco Use   Smoking status: Never   Smokeless tobacco: Never  Vaping Use   Vaping Use: Never used  Substance and Sexual Activity   Alcohol use: Yes    Alcohol/week: 0.0 standard drinks of alcohol    Comment: rarely    Drug use: No   Sexual activity: Yes  Other Topics Concern   Not on file  Social History Narrative   Lives in [place].      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   Social Determinants of Health   Financial Resource Strain: Low Risk  (10/11/2022)   Overall Financial Resource Strain (CARDIA)  Difficulty of Paying Living Expenses: Not hard at all  Food Insecurity: No Food Insecurity (10/11/2022)   Hunger Vital Sign    Worried About Running Out of Food in the Last Year: Never true    Ran Out of Food in the Last Year: Never true  Transportation Needs: No Transportation Needs (10/11/2022)   PRAPARE - Administrator, Civil Service (Medical): No    Lack of Transportation (Non-Medical): No  Physical Activity: Sufficiently Active (10/11/2022)   Exercise Vital Sign    Days of Exercise per Week: 5 days    Minutes of Exercise per Session: 30 min  Stress: No Stress Concern Present (10/11/2022)   [employer] of Occupational Health - Occupational Stress Questionnaire    Feeling of Stress : Not at all  Social Connections: Moderately Integrated (10/11/2022)   Social Connection and Isolation Panel [NHANES]    Frequency of Communication with Friends and Family: Three times a  week    Frequency of Social Gatherings with Friends and Family: More than three times a week    Attends Religious Services: 1 to 4 times per year    Active Member of [employer] or Organizations: No    Attends Banker Meetings: Never    Marital Status: Married     Review of Systems  Constitutional:  Negative for appetite change and unexpected weight change.  HENT:  Negative for congestion and sinus pressure.   Respiratory:  Negative for cough, chest tightness and shortness of breath.   Cardiovascular:  Negative for chest pain and palpitations.       No increased swelling.   Gastrointestinal:  Negative for abdominal pain, diarrhea, nausea and vomiting.  Genitourinary:  Negative for difficulty urinating and dysuria.  Musculoskeletal:  Negative for joint swelling and myalgias.  Skin:  Negative for color change and rash.  Neurological:  Negative for dizziness and headaches.  Psychiatric/Behavioral:  Negative for agitation and dysphoric mood.        Objective:     BP 132/68   Pulse 65   Temp 98.1 F (36.7 C)   Resp 16   Ht 6\' 1"  (1.854 m)   Wt 222 lb 8 oz (100.9 kg)   SpO2 98%   BMI 29.36 kg/m  Wt Readings from Last 3 Encounters:  06/20/23 222 lb 8 oz (100.9 kg)  05/23/23 219 lb 9.6 oz (99.6 kg)  05/10/23 220 lb (99.8 kg)    Physical Exam Vitals reviewed.  Constitutional:      General: He is not in acute distress.    Appearance: Normal appearance. He is well-developed.  HENT:     Head: Normocephalic and atraumatic.     Right Ear: External ear normal.     Left Ear: External ear normal.  Eyes:     General: No scleral icterus.       Right eye: No discharge.        Left eye: No discharge.     Conjunctiva/sclera: Conjunctivae normal.  Cardiovascular:     Rate and Rhythm: Normal rate.  Pulmonary:     Effort: Pulmonary effort is normal. No respiratory distress.     Breath sounds: Normal breath sounds.  Abdominal:     General: Bowel sounds are normal.      Palpations: Abdomen is soft.     Tenderness: There is no abdominal tenderness.  Musculoskeletal:        General: No swelling or tenderness.     Cervical back: Neck supple. No  tenderness.  Lymphadenopathy:     Cervical: No cervical adenopathy.  Skin:    Findings: No erythema or rash.  Neurological:     Mental Status: He is alert.  Psychiatric:        Mood and Affect: Mood normal.        Behavior: Behavior normal.      Outpatient Encounter Medications as of 06/20/2023  Medication Sig   Alpha Lipoic Acid 200 MG CAPS Take 200 mg by mouth in the morning, at noon, and at bedtime.   amLODipine (NORVASC) 10 MG tablet Take 1 tablet (10 mg total) by mouth daily.   Coenzyme Q10 (COQ10 PO) Take 1 capsule by mouth daily.   Cyanocobalamin 5000 MCG SUBL Place 5,000 mcg under the tongue daily.   finasteride (PROSCAR) 5 MG tablet Take 1 tablet (5 mg total) by mouth daily.   losartan (COZAAR) 50 MG tablet Take 1 tablet (50 mg total) by mouth daily.   Multiple Vitamin (MULTI-VITAMINS) TABS Take 1 tablet by mouth daily.   Omega-3 Fatty Acids (FISH OIL PO) Take 1 capsule by mouth daily.   omeprazole (PRILOSEC) 20 MG capsule Take 1 capsule (20 mg total) by mouth daily.   oxybutynin (DITROPAN-XL) 10 MG 24 hr tablet Take 1 tablet (10 mg total) by mouth daily.   simvastatin (ZOCOR) 20 MG tablet TAKE 1 TABLET BY MOUTH AT BEDTIME. APPOINTMENT NEEDED FOR FUTURE REFILLS   No facility-administered encounter medications on file as of 06/20/2023.     Lab Results  Component Value Date   WBC 5.8 06/14/2023   HGB 13.5 06/14/2023   HCT 40.9 06/14/2023   PLT 221.0 06/14/2023   GLUCOSE 93 06/14/2023   CHOL 138 06/14/2023   TRIG 133.0 06/14/2023   HDL 48.40 06/14/2023   LDLCALC 63 06/14/2023   ALT 13 06/14/2023   AST 18 06/14/2023   NA 140 06/14/2023   K 4.1 06/14/2023   CL 106 06/14/2023   CREATININE 0.90 06/14/2023   BUN 14 06/14/2023   CO2 26 06/14/2023   TSH 3.30 06/14/2023   PSA 1.24 06/26/2021    INR 1.0 03/23/2021   HGBA1C 5.9 06/14/2023   MICROALBUR 0.7 10/22/2014       Assessment & Plan:  Essential hypertension Assessment & Plan: Continue losartan and amlodipine.  Pressure as outlined.   Follow pressures.  Follow metabolic panel. Hold on changing.  Follow.   Orders: -     Basic metabolic panel; Future  Hyperglycemia Assessment & Plan: Follow met b and a1c.   Orders: -     Hemoglobin A1c; Future  Hyperlipidemia, unspecified hyperlipidemia type Assessment & Plan: On simvastatin.  Low cholesterol diet and exercise.  Follow lipid panel and liver function tests.    Orders: -     Lipid panel; Future -     Hepatic function panel; Future  Anemia, unspecified type Assessment & Plan: Follow cbc.    Benign prostatic hyperplasia without urinary obstruction Assessment & Plan: BPH LUTS - -tamsulosin 0.4 mg discontinued due to dizziness. finasteride 5 mg daily    OSA on CPAP Assessment & Plan: Continue cpap.  Seeing pulmonary.       [place], MD

## 2023-06-25 ENCOUNTER — Encounter: Payer: Self-pay | Admitting: Internal Medicine

## 2023-06-25 NOTE — Assessment & Plan Note (Signed)
On simvastatin.  Low cholesterol diet and exercise.  Follow lipid panel and liver function tests.   

## 2023-06-25 NOTE — Assessment & Plan Note (Signed)
BPH LUTS - -tamsulosin 0.4 mg discontinued due to dizziness. finasteride 5 mg daily  

## 2023-06-25 NOTE — Assessment & Plan Note (Signed)
Follow cbc.  

## 2023-06-25 NOTE — Assessment & Plan Note (Signed)
Follow met b and a1c.  

## 2023-06-25 NOTE — Assessment & Plan Note (Signed)
Continue losartan and amlodipine.  Pressure as outlined.   Follow pressures.  Follow metabolic panel. Hold on changing.  Follow.  

## 2023-06-25 NOTE — Assessment & Plan Note (Signed)
Continue cpap.  Seeing pulmonary.  

## 2023-08-16 DIAGNOSIS — G4733 Obstructive sleep apnea (adult) (pediatric): Secondary | ICD-10-CM | POA: Diagnosis not present

## 2023-08-29 DIAGNOSIS — M1711 Unilateral primary osteoarthritis, right knee: Secondary | ICD-10-CM | POA: Diagnosis not present

## 2023-09-29 ENCOUNTER — Encounter: Payer: Self-pay | Admitting: Nurse Practitioner

## 2023-09-29 ENCOUNTER — Ambulatory Visit: Payer: HMO | Admitting: Nurse Practitioner

## 2023-09-29 VITALS — BP 136/60 | HR 88 | Temp 97.8°F | Ht 73.0 in | Wt 220.4 lb

## 2023-09-29 DIAGNOSIS — G4733 Obstructive sleep apnea (adult) (pediatric): Secondary | ICD-10-CM | POA: Diagnosis not present

## 2023-09-29 NOTE — Progress Notes (Signed)
@Patient  ID: Brian Pearson, male    DOB: 12-May-1936, 87 y.o.   MRN: [id number]  Chief Complaint  Patient presents with   Follow-up    Wearing CPAP. No problems with mask or pressure.     Referring provider: Dale [place], MD  HPI: 87 year old male, never smoker followed for OSA on CPAP.  He is a patient of Dr. Clovis Fredrickson and last seen in office 09/30/2022.  Past medical history significant for hypertension, carpal tunnel, BPH, HLD, GERD.  TEST/EVENTS:  06/14/2019 HST: AHI 39.3/h, SpO2 low 87%  09/30/2022: OV with Dr. Belia Heman. Severe OSA with AHI 40 /h. On CPAP.  Had some issues the first of the month but otherwise was 100% compliant for greater than 4 hours.  No changes   09/29/2023: Today - follow up Patient presents today for yearly follow-up.  He has severe sleep apnea and is on CPAP.  He does wear it nightly; however, he has been having difficulties with his machine recently so he tends to take it off partway through the night because it keeps making very loud noises.  He also does not feel like it is controlling him as well as it used to.  Having some more daytime fatigue symptoms.  Unfortunately, is not due for a new CPAP machine until July 2025.  He has not taken this to the medical supply company for troubleshooting yet.  He is wearing a DreamWear full facemask.  Denies any drowsy driving, sleep parasomnia/paralysis or morning headaches.  08/30/2023-09/28/2023 CPAP 15-20 cmH2O 27/30 days; 7% >4 hr; average use 2 hours 58 minutes Pressure 95th 9.8 Leaks 95th 39.1 AHI 18  Allergies  Allergen Reactions   Oxycodone Other (See Comments)    Patient felt as if he was "burning on the insides" and refuses to take again   Amoxicillin Rash    Immunization History  Administered Date(s) Administered   Fluad Quad(high Dose 65+) 10/05/2019, 09/19/2020, 09/15/2022   Influenza, High Dose Seasonal PF 08/28/2015, 08/30/2016, 08/30/2017, 08/31/2018   Influenza-Unspecified 09/21/2014,  10/20/2021   PFIZER(Purple Top)SARS-COV-2 Vaccination 01/17/2020, 02/11/2020   Pneumococcal Conjugate-13 10/22/2014   Pneumococcal-Unspecified 10/23/2011   Tdap 08/28/2015    Past Medical History:  Diagnosis Date   Arthritis    BPH with obstruction/lower urinary tract symptoms    GERD (gastroesophageal reflux disease)    Hyperlipidemia    taken off simvastatin last year   Hypertension    Obesity    Pneumonia    hospitalized    Sleep apnea    uses cpap machine    Urinary frequency    Urine incontinence     Tobacco History: Social History   Tobacco Use  Smoking Status Never  Smokeless Tobacco Never   Counseling given: Not Answered   Outpatient Medications Prior to Visit  Medication Sig Dispense Refill   Alpha Lipoic Acid 200 MG CAPS Take 200 mg by mouth in the morning, at noon, and at bedtime.     amLODipine (NORVASC) 10 MG tablet Take 1 tablet (10 mg total) by mouth daily. 90 tablet 2   Coenzyme Q10 (COQ10 PO) Take 1 capsule by mouth daily.     Cyanocobalamin 5000 MCG SUBL Place 5,000 mcg under the tongue daily.     finasteride (PROSCAR) 5 MG tablet Take 1 tablet (5 mg total) by mouth daily. 90 tablet 3   losartan (COZAAR) 50 MG tablet Take 1 tablet (50 mg total) by mouth daily. 90 tablet 2   Multiple Vitamin (MULTI-VITAMINS) TABS Take 1  tablet by mouth daily.     Omega-3 Fatty Acids (FISH OIL PO) Take 1 capsule by mouth daily.     omeprazole (PRILOSEC) 20 MG capsule Take 1 capsule (20 mg total) by mouth daily. 90 capsule 3   oxybutynin (DITROPAN-XL) 10 MG 24 hr tablet Take 1 tablet (10 mg total) by mouth daily. 90 tablet 3   simvastatin (ZOCOR) 20 MG tablet TAKE 1 TABLET BY MOUTH AT BEDTIME. APPOINTMENT NEEDED FOR FUTURE REFILLS 90 tablet 3   No facility-administered medications prior to visit.     Review of Systems:   Constitutional: No weight loss or gain, night sweats, fevers, chills, or lassitude. +fatigue HEENT: No headaches, difficulty swallowing,  tooth/dental problems, or sore throat. No sneezing, itching, ear ache, nasal congestion, or post nasal drip CV:  No chest pain, orthopnea, PND, swelling in lower extremities, anasarca, dizziness, palpitations, syncope Resp: +snores without CPAP. No shortness of breath with exertion or at rest. No excess mucus or change in color of mucus. No productive or non-productive. No hemoptysis. No wheezing.  No chest wall deformity GI:  No heartburn, indigestion GU: No nocturia Neuro: No dizziness or lightheadedness.  Psych: No depression or anxiety. Mood stable. +sleep disturbance    Physical Exam:  BP 136/60 (BP Location: Left Arm, Patient Position: Sitting, Cuff Size: Normal)   Pulse 88   Temp 97.8 F (36.6 C) (Temporal)   Ht 6\' 1"  (1.854 m)   Wt 220 lb 6.4 oz (100 kg)   SpO2 96%   BMI 29.08 kg/m   GEN: Pleasant, interactive, well-appearing; in no acute distress. HEENT:  Normocephalic and atraumatic. PERRLA. Sclera white. Nasal turbinates pink, moist and patent bilaterally. No rhinorrhea present. Oropharynx pink and moist, without exudate or edema. No lesions, ulcerations, or postnasal drip. Mallampati III NECK:  Supple w/ fair ROM. No JVD present. Thyroid symmetrical with no goiter or nodules palpated. No lymphadenopathy.   CV: RRR, no m/r/g, no peripheral edema. Pulses intact, +2 bilaterally. No cyanosis, pallor or clubbing. PULMONARY:  Unlabored, regular breathing. Clear bilaterally A&P w/o wheezes/rales/rhonchi. No accessory muscle use.  GI: BS present and normoactive. Soft, non-tender to palpation. No organomegaly or masses detected.  MSK: No erythema, warmth or tenderness. Cap refil <2 sec all extrem.  Neuro: A/Ox3. No focal deficits noted.   Skin: Warm, no lesions or rashe Psych: Normal affect and behavior. Judgement and thought content appropriate.     Lab Results:  CBC    Component Value Date/Time   WBC 5.8 06/14/2023 0902   RBC 4.36 06/14/2023 0902   HGB 13.5 06/14/2023  0902   HCT 40.9 06/14/2023 0902   PLT 221.0 06/14/2023 0902   MCV 93.8 06/14/2023 0902   MCH 30.7 09/23/2022 1435   MCHC 32.9 06/14/2023 0902   RDW 13.8 06/14/2023 0902   LYMPHSABS 2.0 06/14/2023 0902   MONOABS 0.6 06/14/2023 0902   EOSABS 0.2 06/14/2023 0902   BASOSABS 0.0 06/14/2023 0902    BMET    Component Value Date/Time   NA 140 06/14/2023 0902   K 4.1 06/14/2023 0902   CL 106 06/14/2023 0902   CO2 26 06/14/2023 0902   GLUCOSE 93 06/14/2023 0902   BUN 14 06/14/2023 0902   CREATININE 0.90 06/14/2023 0902   CALCIUM 9.3 06/14/2023 0902   GFRNONAA >60 04/13/2021 0945    BNP No results found for: "BNP"   Imaging:  No results found.  Administration History     None  No data to display          No results found for: "NITRICOXIDE"      Assessment & Plan:   OSA on CPAP Severe OSA on CPAP.  He has previously had a history of excellent compliance.  Seems to be having difficulties with his current machine.  He is poorly controlled on download today with AHI 18/h.  He is also having some moderate to large leaks.  Compliance is suboptimal.  Advised him to take his machine to the DME company for troubleshooting.  May need a loaner machine as he is not due for a new one until July 2025.  Will have him maintain close follow-up to reassess usage/breakthrough events.  If he is still having poor control, will make further adjustments.  Aware of proper use/care of device.  Aware of risks of untreated sleep apnea.  Safe driving practices reviewed.  Patient Instructions  Continue to use CPAP every night, minimum of 4-6 hours a night.  Change equipment as directed. Wash your tubing with warm soap and water daily, hang to dry. Wash humidifier portion weekly. Use bottled, distilled water and change daily Be aware of reduced alertness and do not drive or operate heavy machinery if experiencing this or drowsiness.  Exercise encouraged, as tolerated. Notify if  persistent daytime sleepiness occurs even with consistent use of PAP therapy.  Take your machine to [facility]  [address], [address] [phone]    Follow up in 6 weeks to see how CPAP changes are going with Dr. Belia Heman or Philis Nettle    I spent 35 minutes of dedicated to the care of this patient on the date of this encounter to include pre-visit review of records, face-to-face time with the patient discussing conditions above, post visit ordering of testing, clinical documentation with the electronic health record, making appropriate referrals as documented, and communicating necessary findings to members of the patients care team.  Noemi Chapel, NP 09/29/2023  Pt aware and understands NP's role.

## 2023-09-29 NOTE — Patient Instructions (Addendum)
Continue to use CPAP every night, minimum of 4-6 hours a night.  Change equipment as directed. Wash your tubing with warm soap and water daily, hang to dry. Wash humidifier portion weekly. Use bottled, distilled water and change daily Be aware of reduced alertness and do not drive or operate heavy machinery if experiencing this or drowsiness.  Exercise encouraged, as tolerated. Notify if persistent daytime sleepiness occurs even with consistent use of PAP therapy.  Take your machine to [facility]  [address], [address] [phone]    Follow up in 6 weeks to see how CPAP changes are going with Dr. Belia Heman or Philis Nettle

## 2023-09-29 NOTE — Assessment & Plan Note (Signed)
Severe OSA on CPAP.  He has previously had a history of excellent compliance.  Seems to be having difficulties with his current machine.  He is poorly controlled on download today with AHI 18/h.  He is also having some moderate to large leaks.  Compliance is suboptimal.  Advised him to take his machine to the DME company for troubleshooting.  May need a loaner machine as he is not due for a new one until July 2025.  Will have him maintain close follow-up to reassess usage/breakthrough events.  If he is still having poor control, will make further adjustments.  Aware of proper use/care of device.  Aware of risks of untreated sleep apnea.  Safe driving practices reviewed.  Patient Instructions  Continue to use CPAP every night, minimum of 4-6 hours a night.  Change equipment as directed. Wash your tubing with warm soap and water daily, hang to dry. Wash humidifier portion weekly. Use bottled, distilled water and change daily Be aware of reduced alertness and do not drive or operate heavy machinery if experiencing this or drowsiness.  Exercise encouraged, as tolerated. Notify if persistent daytime sleepiness occurs even with consistent use of PAP therapy.  Take your machine to [facility]  [address], [address] [phone]    Follow up in 6 weeks to see how CPAP changes are going with Dr. Belia Heman or Philis Nettle

## 2023-10-04 DIAGNOSIS — B028 Zoster with other complications: Secondary | ICD-10-CM | POA: Diagnosis not present

## 2023-10-04 DIAGNOSIS — B029 Zoster without complications: Secondary | ICD-10-CM | POA: Diagnosis not present

## 2023-10-14 ENCOUNTER — Ambulatory Visit (INDEPENDENT_AMBULATORY_CARE_PROVIDER_SITE_OTHER): Payer: HMO | Admitting: *Deleted

## 2023-10-14 VITALS — Ht 73.0 in | Wt 220.0 lb

## 2023-10-14 DIAGNOSIS — Z0000 Encounter for general adult medical examination without abnormal findings: Secondary | ICD-10-CM

## 2023-10-14 NOTE — Progress Notes (Signed)
Subjective:   Brian Pearson is a 87 y.o. male who presents for Medicare Annual/Subsequent preventive examination.  Visit Complete: Virtual I connected with  Brian Pearson on 10/14/23 by a audio enabled telemedicine application and verified that I am speaking with the correct person using two identifiers.  Patient Location: Home  Provider Location: Home Office  I discussed the limitations of evaluation and management by telemedicine. The patient expressed understanding and agreed to proceed.  Vital Signs: Because this visit was a virtual/telehealth visit, some criteria may be missing or patient reported. Any vitals not documented were not able to be obtained and vitals that have been documented are patient reported.  Cardiac Risk Factors include: advanced age (>9men, >64 women);male gender;dyslipidemia;hypertension     Objective:    Today's Vitals   10/14/23 1028  Weight: 220 lb (99.8 kg)  Height: 6\' 1"  (1.854 m)  PainSc: 2    Body mass index is 29.03 kg/m.     10/14/2023   10:45 AM 10/11/2022    9:32 AM 03/10/2022    2:51 PM 10/08/2021    9:11 AM 04/13/2021    9:29 AM 04/01/2021   12:42 PM 03/23/2021    8:32 AM  Advanced Directives  Does Patient Have a Medical Advance Directive? Yes Yes Yes Yes  Yes Yes  Type of Estate agent of [place];Living will Healthcare Power of [place];Living will  Healthcare Power of [place];Living will Living will;Healthcare Power of Attorney Living will Living will  Does patient want to make changes to medical advance directive? No - Patient declined No - Patient declined No - Patient declined No - Patient declined  No - Patient declined   Copy of Healthcare Power of Attorney in Chart? Yes - validated most recent copy scanned in chart (See row information) No - copy requested  No - copy requested       Current Medications (verified) Outpatient Encounter Medications as of 10/14/2023  Medication Sig   Alpha Lipoic  Acid 200 MG CAPS Take 200 mg by mouth in the morning, at noon, and at bedtime.   amLODipine (NORVASC) 10 MG tablet Take 1 tablet (10 mg total) by mouth daily.   Coenzyme Q10 (COQ10 PO) Take 1 capsule by mouth daily.   Cyanocobalamin 5000 MCG SUBL Place 5,000 mcg under the tongue daily.   finasteride (PROSCAR) 5 MG tablet Take 1 tablet (5 mg total) by mouth daily.   losartan (COZAAR) 50 MG tablet Take 1 tablet (50 mg total) by mouth daily.   Multiple Vitamin (MULTI-VITAMINS) TABS Take 1 tablet by mouth daily.   Omega-3 Fatty Acids (FISH OIL PO) Take 1 capsule by mouth daily.   omeprazole (PRILOSEC) 20 MG capsule Take 1 capsule (20 mg total) by mouth daily.   oxybutynin (DITROPAN-XL) 10 MG 24 hr tablet Take 1 tablet (10 mg total) by mouth daily.   simvastatin (ZOCOR) 20 MG tablet TAKE 1 TABLET BY MOUTH AT BEDTIME. APPOINTMENT NEEDED FOR FUTURE REFILLS   No facility-administered encounter medications on file as of 10/14/2023.    Allergies (verified) Oxycodone and Amoxicillin   History: Past Medical History:  Diagnosis Date   Arthritis    BPH with obstruction/lower urinary tract symptoms    GERD (gastroesophageal reflux disease)    Hyperlipidemia    taken off simvastatin last year   Hypertension    Obesity    Pneumonia    hospitalized    Sleep apnea    uses cpap machine  Urinary frequency    Urine incontinence    Past Surgical History:  Procedure Laterality Date   COLONOSCOPY WITH PROPOFOL N/A 02/09/2016   Procedure: COLONOSCOPY WITH PROPOFOL;  Surgeon: Wallace Cullens, MD;  Location: [facility] ENDOSCOPY;  Service: Gastroenterology;  Laterality: N/A;   ESOPHAGOGASTRODUODENOSCOPY (EGD) WITH PROPOFOL N/A 02/09/2016   Procedure: ESOPHAGOGASTRODUODENOSCOPY (EGD) WITH PROPOFOL;  Surgeon: Wallace Cullens, MD;  Location: [facility] ENDOSCOPY;  Service: Gastroenterology;  Laterality: N/A;   HERNIA REPAIR     1990's - 3 surgeries   KNEE ARTHROPLASTY Left 04/01/2021   Procedure: COMPUTER ASSISTED TOTAL KNEE  ARTHROPLASTY;  Surgeon: Donato Heinz, MD;  Location: [facility] ORS;  Service: Orthopedics;  Laterality: Left;   Family History  Problem Relation Age of Onset   Arthritis Mother    Heart disease Mother        CHF   Cancer Father        Prostate   Dementia Father    Alzheimer's disease Father    Hypotension Sister    Kidney cancer Neg Hx    Bladder Cancer Neg Hx    Social History   Socioeconomic History   Marital status: Married    Spouse name: Not on file   Number of children: Not on file   Years of education: Not on file   Highest education level: Not on file  Occupational History   Not on file  Tobacco Use   Smoking status: Never   Smokeless tobacco: Never  Vaping Use   Vaping status: Never Used  Substance and Sexual Activity   Alcohol use: Yes    Alcohol/week: 0.0 standard drinks of alcohol    Comment: rarely    Drug use: No   Sexual activity: Yes  Other Topics Concern   Not on file  Social History Narrative   Lives in [place].      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   Social Determinants of Health   Financial Resource Strain: Low Risk  (10/14/2023)   Overall Financial Resource Strain (CARDIA)    Difficulty of Paying Living Expenses: Not hard at all  Food Insecurity: No Food Insecurity (10/14/2023)   Hunger Vital Sign    Worried About Running Out of Food in the Last Year: Never true    Ran Out of Food in the Last Year: Never true  Transportation Needs: No Transportation Needs (10/14/2023)   PRAPARE - Administrator, Civil Service (Medical): No    Lack of Transportation (Non-Medical): No  Physical Activity: Insufficiently Active (10/14/2023)   Exercise Vital Sign    Days of Exercise per Week: 3 days    Minutes of Exercise per Session: 20 min  Stress: No Stress Concern Present (10/14/2023)   [employer] of Occupational Health - Occupational Stress Questionnaire    Feeling of Stress : Only a little  Social Connections: Socially Integrated (10/14/2023)   Social Connection and Isolation Panel [NHANES]    Frequency of Communication with Friends and Family: Three times a week    Frequency of Social Gatherings with Friends and Family: Twice a week    Attends Religious Services: More than 4 times per year    Active Member of Clubs or Organizations: Yes    Attends  Engineer, structural: More than 4 times per year    Marital Status: Married    Tobacco Counseling Counseling given: Not Answered   Clinical Intake:  Pre-visit preparation completed: Yes  Pain : 0-10 Pain Score: 2  Pain Type: Acute pain (has shingles and had seen the doctor) Pain Location: Head Pain Descriptors / Indicators: Dull Pain Onset: 1 to 4 weeks ago (has shingles, saw dermotologist went to the eye doctor) Pain Frequency: Intermittent     BMI - recorded: 29.03 Nutritional Status: BMI 25 -29 Overweight Nutritional Risks: None Diabetes: No  How often do you need to have someone help you when you read instructions, pamphlets, or other written materials from your doctor or pharmacy?: 1 - Never  Interpreter Needed?: No  Information entered by :: R. Yasheka Fossett LPN   Activities of Daily Living    10/14/2023   10:33 AM  In your present state of health, do you have any difficulty performing the following activities:  Hearing? 1  Comment wears aids  Vision? 0  Comment glasses at times  Difficulty concentrating or making decisions? 1  Comment memory not as good as it use to be  Walking or climbing stairs? 0  Dressing or bathing? 0  Doing errands, shopping? 0  Preparing Food and eating ? N  Using the Toilet? N  In the past six months, have you accidently leaked urine? Y  Do you have problems with loss of bowel control? N  Managing your Medications? N  Managing your Finances? N  Housekeeping or  managing your Housekeeping? N    Patient Care Team: Dale Hawkeye, MD as PCP - General (Internal Medicine) Antonieta Iba, MD as PCP - Cardiology (Cardiology)  Indicate any recent Medical Services you may have received from other than [facility] providers in the past year (date may be approximate).     Assessment:   This is a routine wellness examination for [place].  Hearing/Vision screen Hearing Screening - Comments:: Wears aids Vision Screening - Comments:: Glasses at times   Goals Addressed             This Visit's Progress    Patient Stated       Wants to exercise and walk a little more       Depression Screen    10/14/2023   10:40 AM 06/20/2023   10:30 AM 11/17/2022   12:07 PM 10/11/2022    9:29 AM 09/15/2022    8:58 AM 05/07/2022   10:06 AM 10/08/2021    9:13 AM  PHQ 2/9 Scores  PHQ - 2 Score 0 0 0 0 0 0 0  PHQ- 9 Score 0 0         Fall Risk    10/14/2023   10:35 AM 06/20/2023   10:30 AM 11/17/2022   12:07 PM 10/11/2022    9:28 AM 09/15/2022    8:58 AM  Fall Risk   Falls in the past year? 0 0 0 0 0  Number falls in past yr: 0 0 0 0   Injury with Fall? 0 0 0 0   Risk for fall due to : No Fall Risks No Fall Risks No Fall Risks No Fall Risks No Fall Risks  Follow up Falls prevention discussed;Falls evaluation completed Falls evaluation completed Falls evaluation completed Falls evaluation completed Falls evaluation completed    MEDICARE RISK AT HOME: Medicare Risk at Home Any stairs in or around the home?: No If so, are there any without  handrails?: No Home free of loose throw rugs in walkways, pet beds, electrical cords, etc?: Yes Adequate lighting in your home to reduce risk of falls?: Yes Life alert?: No Use of a cane, walker or w/c?: No Grab bars in the bathroom?: Yes Shower chair or bench in shower?: No Elevated toilet seat or a handicapped toilet?: Yes      Cognitive Function:    08/30/2016    9:00 AM 08/28/2015    9:09 AM  MMSE - Mini  Mental State Exam  Orientation to time 5 5  Orientation to Place 5 5  Registration 3 3  Attention/ Calculation 5 5  Recall 3 3  Language- name 2 objects 2 2  Language- repeat 1 1  Language- follow 3 step command 3 3  Language- read & follow direction 1 1  Write a sentence 1 1  Copy design 1 1  Total score 30 30        10/14/2023   10:47 AM 10/11/2022    9:33 AM 10/08/2021    9:19 AM 10/07/2020    9:19 AM 10/05/2019    9:16 AM  6CIT Screen  What Year? 0 points 0 points 0 points 0 points 0 points  What month? 0 points 0 points 0 points 0 points 0 points  What time? 0 points 0 points 0 points  0 points  Count back from 20 0 points 0 points 0 points  0 points  Months in reverse 0 points 0 points 0 points 0 points 0 points  Repeat phrase 0 points 0 points 0 points 0 points   Total Score 0 points 0 points 0 points      Immunizations Immunization History  Administered Date(s) Administered   Fluad Quad(high Dose 65+) 10/05/2019, 09/19/2020, 09/15/2022   Influenza, High Dose Seasonal PF 08/28/2015, 08/30/2016, 08/30/2017, 08/31/2018   Influenza-Unspecified 09/21/2014, 10/20/2021   PFIZER(Purple Top)SARS-COV-2 Vaccination 01/17/2020, 02/11/2020   Pneumococcal Conjugate-13 10/22/2014   Pneumococcal-Unspecified 10/23/2011   Tdap 08/28/2015    TDAP status: Up to date  Flu Vaccine status: Up to date  Pneumococcal vaccine status: Up to date  Covid-19 vaccine status: Information provided on how to obtain vaccines.   Qualifies for Shingles Vaccine? Yes   Zostavax completed No   Shingrix Completed?: No.    Education has been provided regarding the importance of this vaccine. Patient has been advised to call insurance company to determine out of pocket expense if they have not yet received this vaccine. Advised may also receive vaccine at local pharmacy or Health Dept. Verbalized acceptance and understanding.  Screening Tests Health Maintenance  Topic Date Due   Zoster  Vaccines- Shingrix (1 of 2) Never done   COVID-19 Vaccine (3 - Pfizer risk series) 03/10/2020   INFLUENZA VACCINE  07/21/2023   Medicare Annual Wellness (AWV)  10/12/2023   Pneumonia Vaccine 50+ Years old (2 of 2 - PPSV23 or PCV20) 11/18/2023 (Originally 10/23/2015)   DTaP/Tdap/Td (2 - Td or Tdap) 08/27/2025   HPV VACCINES  Aged Out    Health Maintenance  Health Maintenance Due  Topic Date Due   Zoster Vaccines- Shingrix (1 of 2) Never done   COVID-19 Vaccine (3 - Pfizer risk series) 03/10/2020   INFLUENZA VACCINE  07/21/2023   Medicare Annual Wellness (AWV)  10/12/2023    Colorectal cancer screening: No longer required.   Lung Cancer Screening: (Low Dose CT Chest recommended if Age 41-80 years, 20 pack-year currently smoking OR have quit w/in 15years.) does not qualify.  Additional Screening:  Hepatitis C Screening: does not qualify; Completed NA age  Vision Screening: Recommended annual ophthalmology exams for early detection of glaucoma and other disorders of the eye. Is the patient up to date with their annual eye exam?  Yes  Who is the provider or what is the name of the office in which the patient attends annual eye exams? [facility] If pt is not established with a provider, would they like to be referred to a provider to establish care? No .   Dental Screening: Recommended annual dental exams for proper oral hygiene   Community Resource Referral / Chronic Care Management: CRR required this visit?  No   CCM required this visit?  No     Plan:     I have personally reviewed and noted the following in the patient's chart:   Medical and social history Use of alcohol, tobacco or illicit drugs  Current medications and supplements including opioid prescriptions. Patient is not currently taking opioid prescriptions. Functional ability and status Nutritional status Physical activity Advanced directives List of other physicians Hospitalizations, surgeries, and  ER visits in previous 12 months Vitals Screenings to include cognitive, depression, and falls Referrals and appointments  In addition, I have reviewed and discussed with patient certain preventive protocols, quality metrics, and best practice recommendations. A written personalized care plan for preventive services as well as general preventive health recommendations were provided to patient.     Sydell Axon, LPN   40/09/2724   After Visit Summary: (Pick Up) Due to this being a telephonic visit, with patients personalized plan was offered to patient and patient has requested to Pick up at office.  Nurse Notes: None

## 2023-10-14 NOTE — Patient Instructions (Signed)
Brian Pearson , Thank you for taking time to come for your Medicare Wellness Visit. I appreciate your ongoing commitment to your health goals. Please review the following plan we discussed and let me know if I can assist you in the future.   Referrals/Orders/Follow-Ups/Clinician Recommendations: None  This is a list of the screening recommended for you and due dates:  Health Maintenance  Topic Date Due   Zoster (Shingles) Vaccine (1 of 2) Never done   COVID-19 Vaccine (3 - Pfizer risk series) 03/10/2020   Pneumonia Vaccine (2 of 2 - PPSV23 or PCV20) 11/18/2023*   Medicare Annual Wellness Visit  10/13/2024   DTaP/Tdap/Td vaccine (2 - Td or Tdap) 08/27/2025   Flu Shot  Completed   HPV Vaccine  Aged Out  *Topic was postponed. The date shown is not the original due date.    Advanced directives: (In Chart) A copy of your advanced directives are scanned into your chart should your provider ever need it.   Next Medicare Annual Wellness Visit scheduled for next year: Yes 10/19/24 @ 10:30

## 2023-10-20 ENCOUNTER — Other Ambulatory Visit (INDEPENDENT_AMBULATORY_CARE_PROVIDER_SITE_OTHER): Payer: HMO

## 2023-10-20 DIAGNOSIS — E785 Hyperlipidemia, unspecified: Secondary | ICD-10-CM | POA: Diagnosis not present

## 2023-10-20 DIAGNOSIS — I10 Essential (primary) hypertension: Secondary | ICD-10-CM

## 2023-10-20 DIAGNOSIS — R739 Hyperglycemia, unspecified: Secondary | ICD-10-CM | POA: Diagnosis not present

## 2023-10-20 LAB — BASIC METABOLIC PANEL
BUN: 15 mg/dL (ref 6–23)
CO2: 26 meq/L (ref 19–32)
Calcium: 9 mg/dL (ref 8.4–10.5)
Chloride: 105 meq/L (ref 96–112)
Creatinine, Ser: 0.87 mg/dL (ref 0.40–1.50)
GFR: 78.63 mL/min (ref >60.00)
Glucose, Bld: 102 mg/dL — ABNORMAL HIGH (ref 70–99)
Potassium: 4.1 meq/L (ref 3.5–5.1)
Sodium: 141 meq/L (ref 135–145)

## 2023-10-20 LAB — HEPATIC FUNCTION PANEL
ALT: 15 U/L (ref 0–53)
AST: 17 U/L (ref 0–37)
Albumin: 4.2 g/dL (ref 3.5–5.2)
Alkaline Phosphatase: 68 U/L (ref 39–117)
Bilirubin, Direct: 0.1 mg/dL (ref 0.0–0.3)
Total Bilirubin: 0.7 mg/dL (ref 0.2–1.2)
Total Protein: 6.8 g/dL (ref 6.0–8.3)

## 2023-10-20 LAB — HEMOGLOBIN A1C: Hgb A1c MFr Bld: 6.1 % (ref 4.6–6.5)

## 2023-10-20 LAB — LIPID PANEL
Cholesterol: 168 mg/dL (ref 0–200)
HDL: 56.8 mg/dL (ref >39.00)
LDL Cholesterol: 80 mg/dL (ref 0–99)
NonHDL: 111.04
Total CHOL/HDL Ratio: 3
Triglycerides: 155 mg/dL — ABNORMAL HIGH (ref 0.0–149.0)
VLDL: 31 mg/dL (ref 0.0–40.0)

## 2023-10-25 ENCOUNTER — Ambulatory Visit (INDEPENDENT_AMBULATORY_CARE_PROVIDER_SITE_OTHER): Payer: HMO | Admitting: Internal Medicine

## 2023-10-25 VITALS — BP 120/70 | HR 70 | Temp 98.2°F | Resp 16 | Ht 73.0 in | Wt 223.2 lb

## 2023-10-25 DIAGNOSIS — G4733 Obstructive sleep apnea (adult) (pediatric): Secondary | ICD-10-CM

## 2023-10-25 DIAGNOSIS — Z0000 Encounter for general adult medical examination without abnormal findings: Secondary | ICD-10-CM

## 2023-10-25 DIAGNOSIS — D649 Anemia, unspecified: Secondary | ICD-10-CM | POA: Diagnosis not present

## 2023-10-25 DIAGNOSIS — E785 Hyperlipidemia, unspecified: Secondary | ICD-10-CM

## 2023-10-25 DIAGNOSIS — R739 Hyperglycemia, unspecified: Secondary | ICD-10-CM

## 2023-10-25 DIAGNOSIS — M25561 Pain in right knee: Secondary | ICD-10-CM | POA: Diagnosis not present

## 2023-10-25 DIAGNOSIS — I10 Essential (primary) hypertension: Secondary | ICD-10-CM | POA: Diagnosis not present

## 2023-10-25 NOTE — Progress Notes (Signed)
Subjective:    Patient ID: Brian Pearson, male    DOB: 1936/06/24, 87 y.o.   MRN: [id number]  Patient here for  Chief Complaint  Patient presents with   Annual Exam    HPI Here for physical exam. H/o hypercholesterolemia and hypertension.   On losartan and amlodipine.  Was evaluated 05/10/23 - urology.  Continues on oxybutynin for urge incontinence.  On finasteride and tamsulosin for BPH with LUTS. Had f/u with ortho 08/29/23 - s/p aspiration of right knee followed by intra-articular cortisone injection. Helped. Saw pulmonary 09/29/23 - fu OSA.  On cpap. Having problems with his machine.  They recommended notifying supply company. Has new machine. Breathing stable.     Past Medical History:  Diagnosis Date   Arthritis    BPH with obstruction/lower urinary tract symptoms    GERD (gastroesophageal reflux disease)    Hyperlipidemia    taken off simvastatin last year   Hypertension    Obesity    Pneumonia    hospitalized    Sleep apnea    uses cpap machine    Urinary frequency    Urine incontinence    Past Surgical History:  Procedure Laterality Date   COLONOSCOPY WITH PROPOFOL N/A 02/09/2016   Procedure: COLONOSCOPY WITH PROPOFOL;  Surgeon: Wallace Cullens, MD;  Location: [facility] ENDOSCOPY;  Service: Gastroenterology;  Laterality: N/A;   ESOPHAGOGASTRODUODENOSCOPY (EGD) WITH PROPOFOL N/A 02/09/2016   Procedure: ESOPHAGOGASTRODUODENOSCOPY (EGD) WITH PROPOFOL;  Surgeon: Wallace Cullens, MD;  Location: [facility] ENDOSCOPY;  Service: Gastroenterology;  Laterality: N/A;   HERNIA REPAIR     1990's - 3 surgeries   KNEE ARTHROPLASTY Left 04/01/2021   Procedure: COMPUTER ASSISTED TOTAL KNEE ARTHROPLASTY;  Surgeon: Donato Heinz, MD;  Location: [facility] ORS;  Service: Orthopedics;  Laterality: Left;   Family History  Problem Relation Age of Onset   Arthritis Mother    Heart disease Mother        CHF   Cancer Father        Prostate   Dementia Father    Alzheimer's disease Father    Hypotension Sister     Kidney cancer Neg Hx    Bladder Cancer Neg Hx    Social History   Socioeconomic History   Marital status: Married    Spouse name: Not on file   Number of children: Not on file   Years of education: Not on file   Highest education level: Not on file  Occupational History   Not on file  Tobacco Use   Smoking status: Never   Smokeless tobacco: Never  Vaping Use   Vaping status: Never Used  Substance and Sexual Activity   Alcohol use: Yes    Alcohol/week: 0.0 standard drinks of alcohol    Comment: rarely    Drug use: No   Sexual activity: Yes  Other Topics Concern   Not on file  Social History Narrative   Lives in [place].      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   Social Determinants of Health   Financial Resource Strain: Low Risk  (10/14/2023)   Overall Financial Resource Strain (CARDIA)    Difficulty of Paying Living Expenses: Not hard at all  Food Insecurity: No Food Insecurity (10/14/2023)  Hunger Vital Sign    Worried About Running Out of Food in the Last Year: Never true    Ran Out of Food in the Last Year: Never true  Transportation Needs: No Transportation Needs (10/14/2023)   PRAPARE - Administrator, Civil Service (Medical): No    Lack of Transportation (Non-Medical): No  Physical Activity: Insufficiently Active (10/14/2023)   Exercise Vital Sign    Days of Exercise per Week: 3 days    Minutes of Exercise per Session: 20 min  Stress: No Stress Concern Present (10/14/2023)   [employer] of Occupational Health - Occupational Stress Questionnaire    Feeling of Stress : Only a little  Social Connections: Socially Integrated (10/14/2023)   Social Connection and Isolation Panel [NHANES]    Frequency of Communication with Friends and Family: Three times a week    Frequency of Social Gatherings with  Friends and Family: Twice a week    Attends Religious Services: More than 4 times per year    Active Member of [employer] or Organizations: Yes    Attends Engineer, structural: More than 4 times per year    Marital Status: Married     Review of Systems  Constitutional:  Negative for appetite change and unexpected weight change.  HENT:  Negative for congestion, sinus pressure and sore throat.   Eyes:  Negative for pain and visual disturbance.  Respiratory:  Negative for cough, chest tightness and shortness of breath.   Cardiovascular:  Negative for chest pain and palpitations.  Gastrointestinal:  Negative for abdominal pain, diarrhea, nausea and vomiting.  Genitourinary:  Negative for difficulty urinating and dysuria.  Musculoskeletal:  Negative for joint swelling and myalgias.  Skin:  Negative for color change and rash.  Neurological:  Negative for dizziness and headaches.  Hematological:  Negative for adenopathy. Does not bruise/bleed easily.  Psychiatric/Behavioral:  Negative for agitation and dysphoric mood.        Objective:     BP 120/70   Pulse 70   Temp 98.2 F (36.8 C)   Resp 16   Ht 6\' 1"  (1.854 m)   Wt 223 lb 3.2 oz (101.2 kg)   SpO2 99%   BMI 29.45 kg/m  Wt Readings from Last 3 Encounters:  10/25/23 223 lb 3.2 oz (101.2 kg)  10/14/23 220 lb (99.8 kg)  09/29/23 220 lb 6.4 oz (100 kg)    Physical Exam Constitutional:      General: He is not in acute distress.    Appearance: Normal appearance. He is well-developed.  HENT:     Head: Normocephalic and atraumatic.     Right Ear: External ear normal.     Left Ear: External ear normal.  Eyes:     General: No scleral icterus.       Right eye: No discharge.        Left eye: No discharge.     Conjunctiva/sclera: Conjunctivae normal.  Neck:     Thyroid: No thyromegaly.  Cardiovascular:     Rate and Rhythm: Normal rate and regular rhythm.  Pulmonary:     Effort: No respiratory distress.     Breath  sounds: Normal breath sounds. No wheezing.  Abdominal:     General: Bowel sounds are normal.     Palpations: Abdomen is soft.     Tenderness: There is no abdominal tenderness.  Musculoskeletal:        General: No swelling or tenderness.     Cervical back: Neck  supple. No tenderness.  Lymphadenopathy:     Cervical: No cervical adenopathy.  Skin:    Findings: No erythema or rash.  Neurological:     Mental Status: He is alert and oriented to person, place, and time.  Psychiatric:        Mood and Affect: Mood normal.        Behavior: Behavior normal.      Outpatient Encounter Medications as of 10/25/2023  Medication Sig   Alpha Lipoic Acid 200 MG CAPS Take 200 mg by mouth in the morning, at noon, and at bedtime.   amLODipine (NORVASC) 10 MG tablet Take 1 tablet (10 mg total) by mouth daily.   Coenzyme Q10 (COQ10 PO) Take 1 capsule by mouth daily.   Cyanocobalamin 5000 MCG SUBL Place 5,000 mcg under the tongue daily.   finasteride (PROSCAR) 5 MG tablet Take 1 tablet (5 mg total) by mouth daily.   losartan (COZAAR) 50 MG tablet Take 1 tablet (50 mg total) by mouth daily.   Multiple Vitamin (MULTI-VITAMINS) TABS Take 1 tablet by mouth daily.   Omega-3 Fatty Acids (FISH OIL PO) Take 1 capsule by mouth daily.   omeprazole (PRILOSEC) 20 MG capsule Take 1 capsule (20 mg total) by mouth daily.   oxybutynin (DITROPAN-XL) 10 MG 24 hr tablet Take 1 tablet (10 mg total) by mouth daily.   simvastatin (ZOCOR) 20 MG tablet TAKE 1 TABLET BY MOUTH AT BEDTIME. APPOINTMENT NEEDED FOR FUTURE REFILLS   No facility-administered encounter medications on file as of 10/25/2023.     Lab Results  Component Value Date   WBC 5.8 06/14/2023   HGB 13.5 06/14/2023   HCT 40.9 06/14/2023   PLT 221.0 06/14/2023   GLUCOSE 102 (H) 10/20/2023   CHOL 168 10/20/2023   TRIG 155.0 (H) 10/20/2023   HDL 56.80 10/20/2023   LDLCALC 80 10/20/2023   ALT 15 10/20/2023   AST 17 10/20/2023   NA 141 10/20/2023   K 4.1  10/20/2023   CL 105 10/20/2023   CREATININE 0.87 10/20/2023   BUN 15 10/20/2023   CO2 26 10/20/2023   TSH 3.30 06/14/2023   PSA 1.24 06/26/2021   INR 1.0 03/23/2021   HGBA1C 6.1 10/20/2023   MICROALBUR 0.7 10/22/2014       Assessment & Plan:  Routine general medical examination at a health care facility  Right knee pain, unspecified chronicity Assessment & Plan: Had f/u with ortho 08/29/23 - s/p aspiration of right knee followed by intra-articular cortisone injection. Helped.    OSA on CPAP Assessment & Plan: Saw pulmonary 09/29/23 - fu OSA.  On cpap. Having problems with his machine.  They recommended notifying supply company. Has new machine.    Hyperlipidemia, unspecified hyperlipidemia type Assessment & Plan: On simvastatin.  Low cholesterol diet and exercise.  Follow lipid panel and liver function tests.     Hyperglycemia Assessment & Plan: Follow met b and a1c.    Health care maintenance Assessment & Plan: Physical today 10/25/23.  Colonoscopy 01/2016 - normal.  PSA - 06/26/21 - 1.24.    Essential hypertension Assessment & Plan: Continue losartan and amlodipine.  Pressure as outlined.   Follow pressures.  Follow metabolic panel.    Anemia, unspecified type Assessment & Plan: Follow cbc.       Dale Vanderburgh, MD

## 2023-10-28 DIAGNOSIS — G4733 Obstructive sleep apnea (adult) (pediatric): Secondary | ICD-10-CM | POA: Diagnosis not present

## 2023-10-29 ENCOUNTER — Encounter: Payer: Self-pay | Admitting: Internal Medicine

## 2023-10-29 NOTE — Progress Notes (Incomplete)
Subjective:    Patient ID: Brian Pearson, male    DOB: 1936-03-20, 87 y.o.   MRN: [id number]  Patient here for  Chief Complaint  Patient presents with  . Annual Exam    HPI Here to follow up regarding hypercholesterolemia and hypertension.   On losartan and amlodipine.  Was evaluated 05/10/23 - urology.  Continues on oxybutynin for urge incontinence.  Continues on finasteride and tamsulosin for BPH with LUTS. Had f/u with ortho 08/29/23 - s/p aspiration of right knee followed by intra-articular cortisone injection. Saw pulmonary 09/29/23 - fu OSA.  On cpap. Having problems with his machine.  Recommended notifying supply company. Breathing stable.     Past Medical History:  Diagnosis Date  . Arthritis   . BPH with obstruction/lower urinary tract symptoms   . GERD (gastroesophageal reflux disease)   . Hyperlipidemia    taken off simvastatin last year  . Hypertension   . Obesity   . Pneumonia    hospitalized   . Sleep apnea    uses cpap machine   . Urinary frequency   . Urine incontinence    Past Surgical History:  Procedure Laterality Date  . COLONOSCOPY WITH PROPOFOL N/A 02/09/2016   Procedure: COLONOSCOPY WITH PROPOFOL;  Surgeon: Wallace Cullens, MD;  Location: [facility] ENDOSCOPY;  Service: Gastroenterology;  Laterality: N/A;  . ESOPHAGOGASTRODUODENOSCOPY (EGD) WITH PROPOFOL N/A 02/09/2016   Procedure: ESOPHAGOGASTRODUODENOSCOPY (EGD) WITH PROPOFOL;  Surgeon: Wallace Cullens, MD;  Location: [facility] ENDOSCOPY;  Service: Gastroenterology;  Laterality: N/A;  . HERNIA REPAIR     1990's - 3 surgeries  . KNEE ARTHROPLASTY Left 04/01/2021   Procedure: COMPUTER ASSISTED TOTAL KNEE ARTHROPLASTY;  Surgeon: Donato Heinz, MD;  Location: [facility] ORS;  Service: Orthopedics;  Laterality: Left;   Family History  Problem Relation Age of Onset  . Arthritis Mother   . Heart disease Mother        CHF  . Cancer Father        Prostate  . Dementia Father   . Alzheimer's disease Father   . Hypotension Sister    . Kidney cancer Neg Hx   . Bladder Cancer Neg Hx    Social History   Socioeconomic History  . Marital status: Married    Spouse name: Not on file  . Number of children: Not on file  . Years of education: Not on file  . Highest education level: Not on file  Occupational History  . Not on file  Tobacco Use  . Smoking status: Never  . Smokeless tobacco: Never  Vaping Use  . Vaping status: Never Used  Substance and Sexual Activity  . Alcohol use: Yes    Alcohol/week: 0.0 standard drinks of alcohol    Comment: rarely   . Drug use: No  . Sexual activity: Yes  Other Topics Concern  . Not on file  Social History Narrative   Lives in [place].      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   Social Determinants of Health   Financial Resource Strain: Low Risk  (10/14/2023)   Overall Financial Resource Strain (CARDIA)   . Difficulty of Paying Living Expenses: Not hard at all  Food Insecurity: No Food Insecurity (10/14/2023)   Hunger Vital  Sign   . Worried About Running Out of Food in the Last Year: Never true   . Ran Out of Food in the Last Year: Never true  Transportation Needs: No Transportation Needs (10/14/2023)   PRAPARE - Transportation   . Lack of Transportation (Medical): No   . Lack of Transportation (Non-Medical): No  Physical Activity: Insufficiently Active (10/14/2023)   Exercise Vital Sign   . Days of Exercise per Week: 3 days   . Minutes of Exercise per Session: 20 min  Stress: No Stress Concern Present (10/14/2023)   [employer] of Occupational Health - Occupational Stress Questionnaire   . Feeling of Stress : Only a little  Social Connections: Socially Integrated (10/14/2023)   Social Connection and Isolation Panel [NHANES]   . Frequency of Communication with Friends and Family: Three times a week   . Frequency  of Social Gatherings with Friends and Family: Twice a week   . Attends Religious Services: More than 4 times per year   . Active Member of Clubs or Organizations: Yes   . Attends Banker Meetings: More than 4 times per year   . Marital Status: Married     Review of Systems  Constitutional:  Negative for appetite change and unexpected weight change.  HENT:  Negative for congestion, sinus pressure and sore throat.   Eyes:  Negative for pain and visual disturbance.  Respiratory:  Negative for cough, chest tightness and shortness of breath.   Cardiovascular:  Negative for chest pain and palpitations.  Gastrointestinal:  Negative for abdominal pain, diarrhea, nausea and vomiting.  Genitourinary:  Negative for difficulty urinating and dysuria.  Musculoskeletal:  Negative for joint swelling and myalgias.  Skin:  Negative for color change and rash.  Neurological:  Negative for dizziness and headaches.  Hematological:  Negative for adenopathy. Does not bruise/bleed easily.  Psychiatric/Behavioral:  Negative for agitation and dysphoric mood.        Objective:     BP 120/70   Pulse 70   Temp 98.2 F (36.8 C)   Resp 16   Ht 6\' 1"  (1.854 m)   Wt 223 lb 3.2 oz (101.2 kg)   SpO2 99%   BMI 29.45 kg/m  Wt Readings from Last 3 Encounters:  10/25/23 223 lb 3.2 oz (101.2 kg)  10/14/23 220 lb (99.8 kg)  09/29/23 220 lb 6.4 oz (100 kg)    Physical Exam Constitutional:      General: He is not in acute distress.    Appearance: Normal appearance. He is well-developed.  HENT:     Head: Normocephalic and atraumatic.     Right Ear: External ear normal.     Left Ear: External ear normal.  Eyes:     General: No scleral icterus.       Right eye: No discharge.        Left eye: No discharge.     Conjunctiva/sclera: Conjunctivae normal.  Neck:     Thyroid: No thyromegaly.  Cardiovascular:     Rate and Rhythm: Normal rate and regular rhythm.  Pulmonary:     Effort: No  respiratory distress.     Breath sounds: Normal breath sounds. No wheezing.  Abdominal:     General: Bowel sounds are normal.     Palpations: Abdomen is soft.     Tenderness: There is no abdominal tenderness.  Musculoskeletal:        General: No swelling or tenderness.     Cervical back: Neck supple. No  tenderness.  Lymphadenopathy:     Cervical: No cervical adenopathy.  Skin:    Findings: No erythema or rash.  Neurological:     Mental Status: He is alert and oriented to person, place, and time.  Psychiatric:        Mood and Affect: Mood normal.        Behavior: Behavior normal.      Outpatient Encounter Medications as of 10/25/2023  Medication Sig  . Alpha Lipoic Acid 200 MG CAPS Take 200 mg by mouth in the morning, at noon, and at bedtime.  Marland Kitchen amLODipine (NORVASC) 10 MG tablet Take 1 tablet (10 mg total) by mouth daily.  . Coenzyme Q10 (COQ10 PO) Take 1 capsule by mouth daily.  . Cyanocobalamin 5000 MCG SUBL Place 5,000 mcg under the tongue daily.  . finasteride (PROSCAR) 5 MG tablet Take 1 tablet (5 mg total) by mouth daily.  Marland Kitchen losartan (COZAAR) 50 MG tablet Take 1 tablet (50 mg total) by mouth daily.  . Multiple Vitamin (MULTI-VITAMINS) TABS Take 1 tablet by mouth daily.  . Omega-3 Fatty Acids (FISH OIL PO) Take 1 capsule by mouth daily.  Marland Kitchen omeprazole (PRILOSEC) 20 MG capsule Take 1 capsule (20 mg total) by mouth daily.  Marland Kitchen oxybutynin (DITROPAN-XL) 10 MG 24 hr tablet Take 1 tablet (10 mg total) by mouth daily.  . simvastatin (ZOCOR) 20 MG tablet TAKE 1 TABLET BY MOUTH AT BEDTIME. APPOINTMENT NEEDED FOR FUTURE REFILLS   No facility-administered encounter medications on file as of 10/25/2023.     Lab Results  Component Value Date   WBC 5.8 06/14/2023   HGB 13.5 06/14/2023   HCT 40.9 06/14/2023   PLT 221.0 06/14/2023   GLUCOSE 102 (H) 10/20/2023   CHOL 168 10/20/2023   TRIG 155.0 (H) 10/20/2023   HDL 56.80 10/20/2023   LDLCALC 80 10/20/2023   ALT 15 10/20/2023   AST 17  10/20/2023   NA 141 10/20/2023   K 4.1 10/20/2023   CL 105 10/20/2023   CREATININE 0.87 10/20/2023   BUN 15 10/20/2023   CO2 26 10/20/2023   TSH 3.30 06/14/2023   PSA 1.24 06/26/2021   INR 1.0 03/23/2021   HGBA1C 6.1 10/20/2023   MICROALBUR 0.7 10/22/2014    No results found.     Assessment & Plan:  There are no diagnoses linked to this encounter.   Dale Lane, MD

## 2023-10-30 ENCOUNTER — Encounter: Payer: Self-pay | Admitting: Internal Medicine

## 2023-10-30 NOTE — Assessment & Plan Note (Signed)
Follow met b and a1c.  

## 2023-10-30 NOTE — Assessment & Plan Note (Signed)
On simvastatin.  Low cholesterol diet and exercise.  Follow lipid panel and liver function tests.   

## 2023-10-30 NOTE — Assessment & Plan Note (Signed)
Follow cbc.  

## 2023-10-30 NOTE — Assessment & Plan Note (Signed)
Saw pulmonary 09/29/23 - fu OSA.  On cpap. Having problems with his machine.  They recommended notifying supply company. Has new machine.

## 2023-10-30 NOTE — Assessment & Plan Note (Signed)
Continue losartan and amlodipine.  Pressure as outlined.   Follow pressures.  Follow metabolic panel.  

## 2023-10-30 NOTE — Assessment & Plan Note (Signed)
Had f/u with ortho 08/29/23 - s/p aspiration of right knee followed by intra-articular cortisone injection. Helped.

## 2023-10-30 NOTE — Assessment & Plan Note (Signed)
Physical today 10/25/23.  Colonoscopy 01/2016 - normal.  PSA - 06/26/21 - 1.24.

## 2023-11-09 ENCOUNTER — Ambulatory Visit (INDEPENDENT_AMBULATORY_CARE_PROVIDER_SITE_OTHER): Payer: HMO | Admitting: Internal Medicine

## 2023-11-09 ENCOUNTER — Encounter: Payer: Self-pay | Admitting: Internal Medicine

## 2023-11-09 ENCOUNTER — Telehealth: Payer: Self-pay

## 2023-11-09 VITALS — BP 130/70 | HR 71 | Temp 97.6°F | Ht 74.0 in | Wt 225.0 lb

## 2023-11-09 DIAGNOSIS — G4733 Obstructive sleep apnea (adult) (pediatric): Secondary | ICD-10-CM

## 2023-11-09 DIAGNOSIS — I10 Essential (primary) hypertension: Secondary | ICD-10-CM

## 2023-11-09 NOTE — Patient Instructions (Signed)
We will need to review new machines Data  Continue CPAP as prescribed  Avoid secondhand smoke Avoid SICK contacts Recommend  Masking  when appropriate Recommend Keep up-to-date with vaccinations

## 2023-11-09 NOTE — Telephone Encounter (Signed)
Patient stopped by office with serial number for new machine. Serial number is [id number].  Spoke to [place] and provided him with serial number. He stated that he would reach out to cpap team.

## 2023-11-09 NOTE — Telephone Encounter (Signed)
Patient in office and stated that he had received a loaner cpap machine from [employer] earlier this month. According to [employer], data stopped 09/28/2023.  Called Brad with [employer]. He stated that he is not showing record of patient receiving a loaner machine. Brad recommended patient looking at serial number and calling back with update.  Serial number from original cpap given in 2020 is [id number].  Will await call back.

## 2023-11-09 NOTE — Progress Notes (Signed)
Name: REIS TABORA MRN: [id number] DOB: May 12, 1936     CONSULTATION DATE: 11/09/2023  REFERRING MD :Noel Gerold     SYNOPSIS ESTABLISHED CARE FOR SEVERE SLEEP APNEA Patient diagnosed with severe sleep apnea AHI of 40 178 desaturation episodes 300 total episodes of apnea and hypopnea Excellent compliance report previous office visit 100%   CHIEF COMPLAINT:  Follow-up assessment for severe sleep apnea    HISTORY OF PRESENT ILLNESS: Compliance report reviewed in detail with patient Compliance report October 2024 reviewed in detail AHI is only reduced to 18 Patient was it every day 90% compliance Usage is approximately 3 hours Auto CPAP 5-20 DL from 02/4741 Patient states he has a new machine and we will need to review new DL when available  Less fatigue More energy    No exacerbation at this time No evidence of heart failure at this time No evidence or signs of infection at this time No respiratory distress No fevers, chills, nausea, vomiting, diarrhea No evidence of lower extremity edema No evidence hemoptysis   PAST MEDICAL HISTORY :   has a past medical history of Arthritis, BPH with obstruction/lower urinary tract symptoms, GERD (gastroesophageal reflux disease), Hyperlipidemia, Hypertension, Obesity, Pneumonia, Sleep apnea, Urinary frequency, and Urine incontinence.  has a past surgical history that includes Hernia repair; Colonoscopy with propofol (N/A, 02/09/2016); Esophagogastroduodenoscopy (egd) with propofol (N/A, 02/09/2016); and Knee Arthroplasty (Left, 04/01/2021). Prior to Admission medications   Medication Sig Start Date End Date Taking? Authorizing Provider  amLODipine (NORVASC) 2.5 MG tablet TAKE 1 TABLET BY MOUTH ONCE DAILY 12/18/18   Dale Omak, MD  aspirin EC 81 MG tablet Take by mouth.    [provider]  Cholecalciferol (VITAMIN D-3) 1000 units CAPS Take 1 capsule by mouth.    [provider]  Coenzyme Q10 (COQ10  PO) Take 1 capsule by mouth daily.    [provider]  fesoterodine (TOVIAZ) 4 MG TB24 tablet Take 1 tablet (4 mg total) by mouth daily. 05/22/18   Michiel Cowboy A, PA-C  finasteride (PROSCAR) 5 MG tablet Take 1 tablet (5 mg total) by mouth daily. 05/01/19   Michiel Cowboy A, PA-C  Multiple Vitamin (MULTI-VITAMINS) TABS Take by mouth.    [provider]  Omega-3 Fatty Acids (FISH OIL PO) Take by mouth.    [provider]  omeprazole (PRILOSEC) 20 MG capsule TAKE 1 CAPSULE BY MOUTH ONCE DAILY 11/14/18   Dale [place], MD  simvastatin (ZOCOR) 20 MG tablet TAKE 1 TABLET BY MOUTH AT BEDTIME NEEDS  APPOINTMENT  FOR  ADDITIONAL  OR  90  DAY  REFILLS 04/04/19   Dale [place], MD  tamsulosin (FLOMAX) 0.4 MG CAPS capsule Take 1 capsule (0.4 mg total) by mouth daily. 05/01/19   Michiel Cowboy A, PA-C   Allergies  Allergen Reactions   Oxycodone Other (See Comments)    Patient felt as if he was "burning on the insides" and refuses to take again   Amoxicillin Rash   SOCIAL HISTORY:  reports that he has never smoked. He has never used smokeless tobacco. He reports current alcohol use. He reports that he does not use drugs.   BP 130/70 (BP Location: Right Arm, Cuff Size: Normal)   Pulse 71   Temp 97.6 F (36.4 C) (Temporal)   Ht 6\' 2"  (1.88 m)   Wt 225 lb (102.1 kg)   SpO2 97%   BMI 28.89 kg/m      Review of Systems: Gen:  Denies  fever,  sweats, chills weight loss  HEENT: Denies blurred vision, double vision, ear pain, eye pain, hearing loss, nose bleeds, sore throat Cardiac:  No dizziness, chest pain or heaviness, chest tightness,edema, No JVD Resp:   No cough, -sputum production, -shortness of breath,-wheezing, -hemoptysis,  Other:  All other systems negative   Physical Examination:   General Appearance: No distress  EYES PERRLA, EOM intact.   NECK Supple, No JVD Pulmonary: normal breath sounds, No wheezing.  CardiovascularNormal S1,S2.  No m/r/g.    Abdomen: Benign, Soft, non-tender. Neurology UE/LE 5/5 strength, no focal deficits Ext pulses intact, cap refill intact ALL OTHER ROS ARE NEGATIVE     ASSESSMENT AND PLAN SYNOPSIS 87 year old pleasant white male seen today for underlying severe sleep apnea  SEVERE OSA Recommend CPAP titration study if new DL shows no significant reduction in AHI Compliance report reviewed in detail patient   Patient Instructions  Continue to use CPAP every night, minimum of 4-6 hours a night.  Change equipment every 30 days or as directed by DME.  Wash your tubing with warm soap and water daily, hang to dry. Wash humidifier portion weekly. Use bottled, distilled water and change daily   Be aware of reduced alertness and do not drive or operate heavy machinery if experiencing this or drowsiness.  Exercise encouraged, as tolerated. Encouraged proper weight management.  Important to get eight or more hours of sleep  Limiting the use of the computer and television before bedtime.  Decrease naps during the day, so night time sleep will become enhanced.  Limit caffeine, and sleep deprivation.  HTN, stroke, uncontrolled diabetes and heart failure are potential risk factors.  Risk of untreated sleep apnea including cardiac arrhthymias, stroke, DM, pulm HTN.     Hypertension - Sleep apnea can contribute to hypertension, therefore treatment of sleep apnea is important part of hypertension management.      MEDICATION ADJUSTMENTS/LABS AND TESTS ORDERED: Continue CPAP as prescribed  CURRENT MEDICATIONS REVIEWED AT LENGTH WITH PATIENT TODAY   Patient satisfied with Plan of action and management. All questions answered  Follow up in 1 year  Total time spent 31 minutes     Lucie Leather, M.D.  [facility]  Medical Director [facility] Medical Director [facility] Cardio-Pulmonary Department

## 2023-11-16 NOTE — Telephone Encounter (Signed)
Brad with [employer] reports that RT Arminda Resides has not been able to contact patient. Provided Brad with cell phone number for patient.

## 2023-11-22 NOTE — Telephone Encounter (Signed)
Contacted Brian Pearson to follow up on CPAP machine and tagging our office for downloads. Patient reports he did take machine into [employer] local store for repair.

## 2023-11-24 DIAGNOSIS — Z03818 Encounter for observation for suspected exposure to other biological agents ruled out: Secondary | ICD-10-CM | POA: Diagnosis not present

## 2023-11-24 DIAGNOSIS — B338 Other specified viral diseases: Secondary | ICD-10-CM | POA: Diagnosis not present

## 2023-11-24 NOTE — Telephone Encounter (Signed)
Patient advised that his machine has been activated with [employer]. And that download has been reviewed by Dr. Belia Heman. Patient advised to continue using CPAP. Nothing further needed. sd

## 2023-11-24 NOTE — Telephone Encounter (Signed)
Patient's CPAP machine has been registered with [employer]. Download available.

## 2023-12-19 DIAGNOSIS — H2511 Age-related nuclear cataract, right eye: Secondary | ICD-10-CM | POA: Diagnosis not present

## 2023-12-19 DIAGNOSIS — H2512 Age-related nuclear cataract, left eye: Secondary | ICD-10-CM | POA: Diagnosis not present

## 2023-12-19 DIAGNOSIS — D3131 Benign neoplasm of right choroid: Secondary | ICD-10-CM | POA: Diagnosis not present

## 2023-12-27 ENCOUNTER — Other Ambulatory Visit: Payer: Self-pay | Admitting: Internal Medicine

## 2024-01-04 ENCOUNTER — Other Ambulatory Visit: Payer: Self-pay | Admitting: Internal Medicine

## 2024-01-05 ENCOUNTER — Ambulatory Visit: Payer: PPO | Admitting: Urology

## 2024-01-27 DIAGNOSIS — G4733 Obstructive sleep apnea (adult) (pediatric): Secondary | ICD-10-CM | POA: Diagnosis not present

## 2024-02-03 DIAGNOSIS — G5603 Carpal tunnel syndrome, bilateral upper limbs: Secondary | ICD-10-CM | POA: Diagnosis not present

## 2024-02-03 DIAGNOSIS — R42 Dizziness and giddiness: Secondary | ICD-10-CM | POA: Diagnosis not present

## 2024-02-03 DIAGNOSIS — M4800 Spinal stenosis, site unspecified: Secondary | ICD-10-CM | POA: Diagnosis not present

## 2024-02-03 DIAGNOSIS — M47812 Spondylosis without myelopathy or radiculopathy, cervical region: Secondary | ICD-10-CM | POA: Diagnosis not present

## 2024-02-15 NOTE — Progress Notes (Unsigned)
 Name: Brian Pearson MRN: [id number] DOB: Aug 04, 1936     CONSULTATION DATE: 02/15/2024  REFERRING MD :Noel Gerold   SYNOPSIS ESTABLISHED CARE FOR SEVERE SLEEP APNEA Patient diagnosed with severe sleep apnea AHI of 40 178 desaturation episodes 300 total episodes of apnea and hypopnea Excellent compliance report previous office visit 100%   CHIEF COMPLAINT:  Follow-up assessment for severe sleep apnea   HISTORY OF PRESENT ILLNESS: Compliance report reviewed in detail with patient Patient obtained a new machine will need to review download  Compliance report reviewed in detail with patient Compliance report October 2024 reviewed in detail AHI is only reduced to 18 Patient was it every day 90% compliance Usage is approximately 3 hours Auto CPAP 5-20 DL from 82/9562 Patient states he has a new machine and we will need to review new DL when available  Less fatigue More energy   No exacerbation at this time No evidence of heart failure at this time No evidence or signs of infection at this time No respiratory distress No fevers, chills, nausea, vomiting, diarrhea No evidence of lower extremity edema No evidence hemoptysis     PAST MEDICAL HISTORY :   has a past medical history of Arthritis, BPH with obstruction/lower urinary tract symptoms, GERD (gastroesophageal reflux disease), Hyperlipidemia, Hypertension, Obesity, Pneumonia, Sleep apnea, Urinary frequency, and Urine incontinence.  has a past surgical history that includes Hernia repair; Colonoscopy with propofol (N/A, 02/09/2016); Esophagogastroduodenoscopy (egd) with propofol (N/A, 02/09/2016); and Knee Arthroplasty (Left, 04/01/2021). Prior to Admission medications   Medication Sig Start Date End Date Taking? Authorizing Provider  amLODipine (NORVASC) 2.5 MG tablet TAKE 1 TABLET BY MOUTH ONCE DAILY 12/18/18   Dale [place], MD  aspirin EC 81 MG tablet Take by mouth.    [provider]   Cholecalciferol (VITAMIN D-3) 1000 units CAPS Take 1 capsule by mouth.    [provider]  Coenzyme Q10 (COQ10 PO) Take 1 capsule by mouth daily.    [provider]  fesoterodine (TOVIAZ) 4 MG TB24 tablet Take 1 tablet (4 mg total) by mouth daily. 05/22/18   Michiel Cowboy A, PA-C  finasteride (PROSCAR) 5 MG tablet Take 1 tablet (5 mg total) by mouth daily. 05/01/19   Michiel Cowboy A, PA-C  Multiple Vitamin (MULTI-VITAMINS) TABS Take by mouth.    [provider]  Omega-3 Fatty Acids (FISH OIL PO) Take by mouth.    [provider]  omeprazole (PRILOSEC) 20 MG capsule TAKE 1 CAPSULE BY MOUTH ONCE DAILY 11/14/18   Dale [place], MD  simvastatin (ZOCOR) 20 MG tablet TAKE 1 TABLET BY MOUTH AT BEDTIME NEEDS  APPOINTMENT  FOR  ADDITIONAL  OR  90  DAY  REFILLS 04/04/19   Dale [place], MD  tamsulosin (FLOMAX) 0.4 MG CAPS capsule Take 1 capsule (0.4 mg total) by mouth daily. 05/01/19   Michiel Cowboy A, PA-C   Allergies  Allergen Reactions   Oxycodone Other (See Comments)    Patient felt as if he was "burning on the insides" and refuses to take again   Amoxicillin Rash   SOCIAL HISTORY:  reports that he has never smoked. He has never used smokeless tobacco. He reports current alcohol use. He reports that he does not use drugs.     There were no vitals taken for this visit.      Review of Systems: Gen:  Denies  fever, sweats, chills weight loss  HEENT: Denies blurred vision, double vision, ear pain, eye pain, hearing loss, nose  bleeds, sore throat Cardiac:  No dizziness, chest pain or heaviness, chest tightness,edema, No JVD Resp:   No cough, -sputum production, -shortness of breath,-wheezing, -hemoptysis,  Other:  All other systems negative   Physical Examination:   General Appearance: No distress  EYES PERRLA, EOM intact.   NECK Supple, No JVD Pulmonary: normal breath sounds, No wheezing.  CardiovascularNormal S1,S2.  No m/r/g.   Abdomen:  Benign, Soft, non-tender. Neurology UE/LE 5/5 strength, no focal deficits Ext pulses intact, cap refill intact ALL OTHER ROS ARE NEGATIVE      ASSESSMENT AND PLAN SYNOPSIS 88 year old pleasant white male seen today for underlying severe sleep apnea  SEVERE OSA Recommend CPAP titration study if new DL shows no significant reduction in AHI Compliance report reviewed in detail patient   Patient Instructions  Continue to use CPAP every night, minimum of 4-6 hours a night.  Change equipment every 30 days or as directed by DME.  Wash your tubing with warm soap and water daily, hang to dry. Wash humidifier portion weekly. Use bottled, distilled water and change daily   Be aware of reduced alertness and do not drive or operate heavy machinery if experiencing this or drowsiness.  Exercise encouraged, as tolerated. Encouraged proper weight management.  Important to get eight or more hours of sleep  Limiting the use of the computer and television before bedtime.  Decrease naps during the day, so night time sleep will become enhanced.  Limit caffeine, and sleep deprivation.  HTN, stroke, uncontrolled diabetes and heart failure are potential risk factors.  Risk of untreated sleep apnea including cardiac arrhthymias, stroke, DM, pulm HTN.     Hypertension - Sleep apnea can contribute to hypertension, therefore treatment of sleep apnea is important part of hypertension management.      MEDICATION ADJUSTMENTS/LABS AND TESTS ORDERED: Continue CPAP as prescribed  CURRENT MEDICATIONS REVIEWED AT LENGTH WITH PATIENT TODAY   Patient satisfied with Plan of action and management. All questions answered  Follow up in 1 year  Total time spent 31 minutes     Lucie Leather, M.D.  [facility]  Medical Director [facility] Medical Director [facility] Cardio-Pulmonary Department

## 2024-02-16 ENCOUNTER — Encounter: Payer: Self-pay | Admitting: Internal Medicine

## 2024-02-16 ENCOUNTER — Ambulatory Visit: Payer: HMO | Admitting: Internal Medicine

## 2024-02-16 VITALS — BP 136/60 | HR 74 | Temp 97.6°F | Ht 74.0 in | Wt 224.8 lb

## 2024-02-16 DIAGNOSIS — G4733 Obstructive sleep apnea (adult) (pediatric): Secondary | ICD-10-CM | POA: Diagnosis not present

## 2024-02-16 NOTE — Patient Instructions (Signed)
 Recommend mask desensitization referral Recommend changing auto CPAP 4-10  Patient Instructions  Continue to use CPAP every night, minimum of 4-6 hours a night.  Change equipment every 30 days or as directed by DME.  Wash your tubing with warm soap and water daily, hang to dry. Wash humidifier portion weekly. Use bottled, distilled water and change daily   Be aware of reduced alertness and do not drive or operate heavy machinery if experiencing this or drowsiness.  Exercise encouraged, as tolerated. Encouraged proper weight management.  Important to get eight or more hours of sleep  Limiting the use of the computer and television before bedtime.  Decrease naps during the day, so night time sleep will become enhanced.  Limit caffeine, and sleep deprivation.  HTN, stroke, uncontrolled diabetes and heart failure are potential risk factors.  Risk of untreated sleep apnea including cardiac arrhthymias, stroke, DM, pulm HTN.   Avoid Allergens and Irritants Avoid secondhand smoke Avoid SICK contacts Recommend  Masking  when appropriate Recommend Keep up-to-date with vaccinations

## 2024-02-17 ENCOUNTER — Other Ambulatory Visit: Payer: Self-pay

## 2024-02-17 DIAGNOSIS — E785 Hyperlipidemia, unspecified: Secondary | ICD-10-CM

## 2024-02-17 DIAGNOSIS — I10 Essential (primary) hypertension: Secondary | ICD-10-CM

## 2024-02-17 DIAGNOSIS — R739 Hyperglycemia, unspecified: Secondary | ICD-10-CM

## 2024-02-20 ENCOUNTER — Other Ambulatory Visit (INDEPENDENT_AMBULATORY_CARE_PROVIDER_SITE_OTHER): Payer: HMO

## 2024-02-20 DIAGNOSIS — E785 Hyperlipidemia, unspecified: Secondary | ICD-10-CM | POA: Diagnosis not present

## 2024-02-20 DIAGNOSIS — I10 Essential (primary) hypertension: Secondary | ICD-10-CM

## 2024-02-20 DIAGNOSIS — R739 Hyperglycemia, unspecified: Secondary | ICD-10-CM

## 2024-02-20 LAB — HEPATIC FUNCTION PANEL
ALT: 13 U/L (ref 0–53)
AST: 18 U/L (ref 0–37)
Albumin: 4.2 g/dL (ref 3.5–5.2)
Alkaline Phosphatase: 63 U/L (ref 39–117)
Bilirubin, Direct: 0.1 mg/dL (ref 0.0–0.3)
Total Bilirubin: 0.6 mg/dL (ref 0.2–1.2)
Total Protein: 6.7 g/dL (ref 6.0–8.3)

## 2024-02-20 LAB — BASIC METABOLIC PANEL
BUN: 12 mg/dL (ref 6–23)
CO2: 27 meq/L (ref 19–32)
Calcium: 9.2 mg/dL (ref 8.4–10.5)
Chloride: 106 meq/L (ref 96–112)
Creatinine, Ser: 0.86 mg/dL (ref 0.40–1.50)
GFR: 78.72 mL/min (ref >60.00)
Glucose, Bld: 100 mg/dL — ABNORMAL HIGH (ref 70–99)
Potassium: 4 meq/L (ref 3.5–5.1)
Sodium: 141 meq/L (ref 135–145)

## 2024-02-20 LAB — HEMOGLOBIN A1C: Hgb A1c MFr Bld: 6.1 % (ref 4.6–6.5)

## 2024-02-20 LAB — LIPID PANEL
Cholesterol: 135 mg/dL (ref 0–200)
HDL: 54.3 mg/dL (ref >39.00)
LDL Cholesterol: 59 mg/dL (ref 0–99)
NonHDL: 81.09
Total CHOL/HDL Ratio: 2
Triglycerides: 111 mg/dL (ref 0.0–149.0)
VLDL: 22.2 mg/dL (ref 0.0–40.0)

## 2024-02-22 ENCOUNTER — Ambulatory Visit (INDEPENDENT_AMBULATORY_CARE_PROVIDER_SITE_OTHER): Payer: HMO | Admitting: Internal Medicine

## 2024-02-22 ENCOUNTER — Encounter: Payer: Self-pay | Admitting: Internal Medicine

## 2024-02-22 VITALS — BP 126/70 | HR 71 | Temp 98.0°F | Resp 16 | Ht 74.0 in | Wt 220.0 lb

## 2024-02-22 DIAGNOSIS — I10 Essential (primary) hypertension: Secondary | ICD-10-CM | POA: Diagnosis not present

## 2024-02-22 DIAGNOSIS — R739 Hyperglycemia, unspecified: Secondary | ICD-10-CM | POA: Diagnosis not present

## 2024-02-22 DIAGNOSIS — Z0000 Encounter for general adult medical examination without abnormal findings: Secondary | ICD-10-CM | POA: Diagnosis not present

## 2024-02-22 DIAGNOSIS — E785 Hyperlipidemia, unspecified: Secondary | ICD-10-CM | POA: Diagnosis not present

## 2024-02-22 DIAGNOSIS — G4733 Obstructive sleep apnea (adult) (pediatric): Secondary | ICD-10-CM | POA: Diagnosis not present

## 2024-02-22 NOTE — Progress Notes (Signed)
 Subjective:    Patient ID: Brian Pearson, male    DOB: [birth year], 88 y.o.   MRN: [id number]  Patient here for  Chief Complaint  Patient presents with   Annual Exam    HPI Here for a physical exam. Seeing pulmonary for OSA. Just evaluated 02/16/24. Using CPAP. Mask leakage. Recommended referral to mask desensitization protocol. Changed to auto cpap 4-10. He is scheduled to follow up. Has tightened the strap - which has helped some. Evaluated by neurology 02/03/24 - dizziness. Completed course of PT vestibular rehab. Reassuring carotid ultrasound and MRI brain. Overall he feels he is doing relatively well. Breathing stable. No increased cough or congestion.    Past Medical History:  Diagnosis Date   Arthritis    BPH with obstruction/lower urinary tract symptoms    GERD (gastroesophageal reflux disease)    Hyperlipidemia    taken off simvastatin last year   Hypertension    Obesity    Pneumonia    hospitalized    Sleep apnea    uses cpap machine    Urinary frequency    Urine incontinence    Past Surgical History:  Procedure Laterality Date   COLONOSCOPY WITH PROPOFOL N/A 02/09/2016   Procedure: COLONOSCOPY WITH PROPOFOL;  Surgeon: Wallace Cullens, MD;  Location: [facility] ENDOSCOPY;  Service: Gastroenterology;  Laterality: N/A;   ESOPHAGOGASTRODUODENOSCOPY (EGD) WITH PROPOFOL N/A 02/09/2016   Procedure: ESOPHAGOGASTRODUODENOSCOPY (EGD) WITH PROPOFOL;  Surgeon: Wallace Cullens, MD;  Location: [facility] ENDOSCOPY;  Service: Gastroenterology;  Laterality: N/A;   HERNIA REPAIR     1990's - 3 surgeries   KNEE ARTHROPLASTY Left 04/01/2021   Procedure: COMPUTER ASSISTED TOTAL KNEE ARTHROPLASTY;  Surgeon: Donato Heinz, MD;  Location: [facility] ORS;  Service: Orthopedics;  Laterality: Left;   Family History  Problem Relation Age of Onset   Arthritis Mother    Heart disease Mother        CHF   Cancer Father        Prostate   Dementia Father    Alzheimer's disease Father    Hypotension Sister    Kidney  cancer Neg Hx    Bladder Cancer Neg Hx    Social History   Socioeconomic History   Marital status: Married    Spouse name: Not on file   Number of children: Not on file   Years of education: Not on file   Highest education level: Not on file  Occupational History   Not on file  Tobacco Use   Smoking status: Never   Smokeless tobacco: Never  Vaping Use   Vaping status: Never Used  Substance and Sexual Activity   Alcohol use: Yes    Alcohol/week: 0.0 standard drinks of alcohol    Comment: rarely    Drug use: No   Sexual activity: Yes  Other Topics Concern   Not on file  Social History Narrative   Lives in [place].      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   Social Drivers of Health   Financial Resource Strain: Low Risk  (10/14/2023)   Overall Financial Resource Strain (CARDIA)    Difficulty of Paying Living Expenses: Not hard at all  Food Insecurity: No Food Insecurity (10/14/2023)   Hunger Vital  Sign    Worried About Programme researcher, broadcasting/film/video in the Last Year: Never true    Ran Out of Food in the Last Year: Never true  Transportation Needs: No Transportation Needs (10/14/2023)   PRAPARE - Administrator, Civil Service (Medical): No    Lack of Transportation (Non-Medical): No  Physical Activity: Insufficiently Active (10/14/2023)   Exercise Vital Sign    Days of Exercise per Week: 3 days    Minutes of Exercise per Session: 20 min  Stress: No Stress Concern Present (10/14/2023)   [employer] of Occupational Health - Occupational Stress Questionnaire    Feeling of Stress : Only a little  Social Connections: Socially Integrated (10/14/2023)   Social Connection and Isolation Panel [NHANES]    Frequency of Communication with Friends and Family: Three times a week    Frequency of Social Gatherings with Friends and  Family: Twice a week    Attends Religious Services: More than 4 times per year    Active Member of [employer] or Organizations: Yes    Attends Engineer, structural: More than 4 times per year    Marital Status: Married     Review of Systems  Constitutional:  Negative for appetite change and unexpected weight change.  HENT:  Negative for congestion, sinus pressure and sore throat.   Eyes:  Negative for pain and visual disturbance.  Respiratory:  Negative for cough, chest tightness and shortness of breath.   Cardiovascular:  Negative for chest pain, palpitations and leg swelling.  Gastrointestinal:  Negative for abdominal pain, diarrhea, nausea and vomiting.  Genitourinary:  Negative for difficulty urinating and dysuria.  Musculoskeletal:  Negative for joint swelling and myalgias.  Skin:  Negative for color change and rash.  Neurological:  Negative for dizziness and headaches.  Hematological:  Negative for adenopathy. Does not bruise/bleed easily.  Psychiatric/Behavioral:  Negative for agitation and dysphoric mood.        Objective:     BP 126/70   Pulse 71   Temp 98 F (36.7 C)   Resp 16   Ht 6\' 2"  (1.88 m)   Wt 220 lb (99.8 kg)   SpO2 98%   BMI 28.25 kg/m  Wt Readings from Last 3 Encounters:  02/22/24 220 lb (99.8 kg)  02/16/24 224 lb 12.8 oz (102 kg)  11/09/23 225 lb (102.1 kg)    Physical Exam Constitutional:      General: He is not in acute distress.    Appearance: Normal appearance. He is well-developed.  HENT:     Head: Normocephalic and atraumatic.     Right Ear: External ear normal.     Left Ear: External ear normal.     Mouth/Throat:     Pharynx: No oropharyngeal exudate or posterior oropharyngeal erythema.  Eyes:     General: No scleral icterus.       Right eye: No discharge.        Left eye: No discharge.     Conjunctiva/sclera: Conjunctivae normal.  Neck:     Thyroid: No thyromegaly.  Cardiovascular:     Rate and Rhythm: Normal rate and  regular rhythm.  Pulmonary:     Effort: No respiratory distress.     Breath sounds: Normal breath sounds. No wheezing.  Abdominal:     General: Bowel sounds are normal.     Palpations: Abdomen is soft.     Tenderness: There is no abdominal tenderness.  Musculoskeletal:  General: No swelling or tenderness.     Cervical back: Neck supple. No tenderness.  Lymphadenopathy:     Cervical: No cervical adenopathy.  Skin:    Findings: No erythema or rash.  Neurological:     Mental Status: He is alert and oriented to person, place, and time.  Psychiatric:        Mood and Affect: Mood normal.        Behavior: Behavior normal.         Outpatient Encounter Medications as of 02/22/2024  Medication Sig   Alpha Lipoic Acid 200 MG CAPS Take 200 mg by mouth in the morning, at noon, and at bedtime.   amLODipine (NORVASC) 10 MG tablet TAKE ONE TABLET BY MOUTH EVERY DAY   Coenzyme Q10 (COQ10 PO) Take 1 capsule by mouth daily.   Cyanocobalamin 5000 MCG SUBL Place 5,000 mcg under the tongue daily.   finasteride (PROSCAR) 5 MG tablet Take 1 tablet (5 mg total) by mouth daily.   losartan (COZAAR) 50 MG tablet TAKE ONE TABLET BY MOUTH EVERY DAY   Multiple Vitamin (MULTI-VITAMINS) TABS Take 1 tablet by mouth daily.   Omega-3 Fatty Acids (FISH OIL PO) Take 1 capsule by mouth daily.   omeprazole (PRILOSEC) 20 MG capsule Take 1 capsule (20 mg total) by mouth daily.   oxybutynin (DITROPAN-XL) 10 MG 24 hr tablet Take 1 tablet (10 mg total) by mouth daily.   simvastatin (ZOCOR) 20 MG tablet TAKE 1 TABLET BY MOUTH AT BEDTIME. APPOINTMENT NEEDED FOR FUTURE REFILLS   No facility-administered encounter medications on file as of 02/22/2024.     Lab Results  Component Value Date   WBC 5.8 06/14/2023   HGB 13.5 06/14/2023   HCT 40.9 06/14/2023   PLT 221.0 06/14/2023   GLUCOSE 100 (H) 02/20/2024   CHOL 135 02/20/2024   TRIG 111.0 02/20/2024   HDL 54.30 02/20/2024   LDLCALC 59 02/20/2024   ALT 13  02/20/2024   AST 18 02/20/2024   NA 141 02/20/2024   K 4.0 02/20/2024   CL 106 02/20/2024   CREATININE 0.86 02/20/2024   BUN 12 02/20/2024   CO2 27 02/20/2024   TSH 3.30 06/14/2023   PSA 1.24 06/26/2021   INR 1.0 03/23/2021   HGBA1C 6.1 02/20/2024   MICROALBUR 0.7 10/22/2014       Assessment & Plan:  Routine general medical examination at a health care facility  Hyperlipidemia, unspecified hyperlipidemia type Assessment & Plan: On simvastatin.  Low-cholesterol diet and exercise.  Follow lipid panel liver function test. Lab Results  Component Value Date   CHOL 135 02/20/2024   HDL 54.30 02/20/2024   LDLCALC 59 02/20/2024   TRIG 111.0 02/20/2024   CHOLHDL 2 02/20/2024     Orders: -     Lipid panel; Future -     Hepatic function panel; Future  Hyperglycemia Assessment & Plan: Follow met b and A1c.   Lab Results  Component Value Date   HGBA1C 6.1 02/20/2024     Orders: -     Hemoglobin A1c; Future  Essential hypertension Assessment & Plan: Continue losartan and amlodipine.  Pressure as outlined.  No changes in medication today.  Follow pressures.  Follow metabolic panel.  Orders: -     Basic metabolic panel; Future -     TSH; Future -     CBC with Differential/Platelet; Future  OSA on CPAP Assessment & Plan: Just evaluated 02/16/24. Using CPAP. Mask leakage. Recommended referral to mask desensitization protocol.  Changed to auto cpap 4-10. He is scheduled to follow up. Has tightened his strap. This has helped some. Keep f/u.    Health care maintenance Assessment & Plan: Colonoscopy February 2017 normal.      Dale Heflin, MD

## 2024-02-23 ENCOUNTER — Ambulatory Visit: Payer: HMO | Admitting: Urology

## 2024-02-26 NOTE — Assessment & Plan Note (Signed)
 On simvastatin.  Low-cholesterol diet and exercise.  Follow lipid panel liver function test. Lab Results  Component Value Date   CHOL 135 02/20/2024   HDL 54.30 02/20/2024   LDLCALC 59 02/20/2024   TRIG 111.0 02/20/2024   CHOLHDL 2 02/20/2024

## 2024-02-26 NOTE — Assessment & Plan Note (Signed)
 Just evaluated 02/16/24. Using CPAP. Mask leakage. Recommended referral to mask desensitization protocol. Changed to auto cpap 4-10. He is scheduled to follow up. Has tightened his strap. This has helped some. Keep f/u.

## 2024-02-26 NOTE — Assessment & Plan Note (Signed)
 Colonoscopy February 2017 normal.

## 2024-02-26 NOTE — Assessment & Plan Note (Signed)
 Continue losartan and amlodipine.  Pressure as outlined.  No changes in medication today.  Follow pressures.  Follow metabolic panel.

## 2024-02-26 NOTE — Assessment & Plan Note (Signed)
 Follow met b and A1c.   Lab Results  Component Value Date   HGBA1C 6.1 02/20/2024

## 2024-03-01 ENCOUNTER — Ambulatory Visit: Payer: Self-pay | Admitting: Internal Medicine

## 2024-03-01 DIAGNOSIS — R350 Frequency of micturition: Secondary | ICD-10-CM | POA: Diagnosis not present

## 2024-03-01 DIAGNOSIS — I10 Essential (primary) hypertension: Secondary | ICD-10-CM | POA: Diagnosis not present

## 2024-03-01 NOTE — Telephone Encounter (Signed)
  Chief Complaint: high blood pressure Symptoms: increase in urination and dizzy  Disposition: [] ED /[x] Urgent Care (no appt availability in office) / [] Appointment(In office/virtual)/ []  [facility]/ [] Home Care/ [] Refused Recommended Disposition /[] [facility]/ []  Follow-up with PCP Additional Notes: Pt wife, Steward Drone, calling on behalf of pt. Pt has had some elevated BP readings today. Readings have been 176/80, 185/85, and 177/82. Pt denies any chest pain for SOB. PT does have increase in urination and feels dizzy at times. Pt denies any weakness or unsteady gait. Pt takes Amlodipine and Losartan for maintenance.  Pt needs to be seen today. No [facility] providers available. RN advised pt to go to urgent care and be seen today. Steward Drone verbalized understanding.          Copied from CRM [id number]. Topic: Clinical - Red Word Triage >> Mar 01, 2024  2:00 PM Elizebeth Brooking wrote: [facility] that prompted transfer to Nurse Triage: Patient wife called in stated that patient blood pressure has been running high all day, last reading was 185/85. Patient has also been running to the rest room very frequently today Reason for Disposition  [1] Systolic BP  >= 200 OR Diastolic >= 120 AND [2] having NO cardiac or neurologic symptoms  Answer Assessment - Initial Assessment Questions 1. BLOOD PRESSURE: "What is the blood pressure?" "Did you take at least two measurements 5 minutes apart?"     185/85; 180/84; 177/82 2. ONSET: "When did you take your blood pressure?"     10 minutes ago  3. HOW: "How did you take your blood pressure?" (e.g., automatic home BP monitor, visiting nurse)     Automatic  4. HISTORY: "Do you have a history of high blood pressure?"     Yes  5. MEDICINES: "Are you taking any medicines for blood pressure?" "Have you missed any doses recently?"     Amlodipine and losartan  6. OTHER SYMPTOMS: "Do you have any symptoms?" (e.g., blurred vision, chest pain, difficulty  breathing, headache, weakness)     Urination/dizziness  Protocols used: Blood Pressure - High-A-AH

## 2024-03-01 NOTE — Telephone Encounter (Signed)
 Spoke with pt's wife and she stated that they are on the way to UC now.

## 2024-03-02 NOTE — Telephone Encounter (Signed)
 Spoke to pt he did go to UC and he  stated that he was feeling better today his bp has come down. I offered an in ov but he stated that he did not need one at this time but would call if he needed to

## 2024-03-02 NOTE — Telephone Encounter (Signed)
 Reviewed. Please call and confirm evaluated. Doing ok now?

## 2024-03-21 ENCOUNTER — Other Ambulatory Visit (HOSPITAL_BASED_OUTPATIENT_CLINIC_OR_DEPARTMENT_OTHER): Payer: HMO

## 2024-03-21 ENCOUNTER — Ambulatory Visit (HOSPITAL_BASED_OUTPATIENT_CLINIC_OR_DEPARTMENT_OTHER): Attending: Internal Medicine

## 2024-03-21 DIAGNOSIS — G4733 Obstructive sleep apnea (adult) (pediatric): Secondary | ICD-10-CM

## 2024-03-28 ENCOUNTER — Other Ambulatory Visit: Payer: Self-pay | Admitting: Internal Medicine

## 2024-03-28 DIAGNOSIS — E785 Hyperlipidemia, unspecified: Secondary | ICD-10-CM

## 2024-04-10 ENCOUNTER — Other Ambulatory Visit: Payer: Self-pay | Admitting: Urology

## 2024-04-10 DIAGNOSIS — N401 Enlarged prostate with lower urinary tract symptoms: Secondary | ICD-10-CM

## 2024-04-12 ENCOUNTER — Encounter: Payer: Self-pay | Admitting: Internal Medicine

## 2024-04-12 ENCOUNTER — Ambulatory Visit: Payer: HMO | Admitting: Internal Medicine

## 2024-04-12 VITALS — BP 146/80 | HR 64 | Ht 73.0 in | Wt 222.6 lb

## 2024-04-12 DIAGNOSIS — I10 Essential (primary) hypertension: Secondary | ICD-10-CM | POA: Diagnosis not present

## 2024-04-12 DIAGNOSIS — G4733 Obstructive sleep apnea (adult) (pediatric): Secondary | ICD-10-CM

## 2024-04-12 NOTE — Patient Instructions (Addendum)
 Referral to DME company  RESMED AIR FIT F40 MASK Change AUTO CPAP 4-16 cm h20  Avoid Allergens and Irritants Avoid secondhand smoke Avoid SICK contacts Recommend  Masking  when appropriate Recommend Keep up-to-date with vaccinations  Excellent Job A+ GOLD STAR!!  Patient Instructions Continue to use CPAP every night, minimum of 4-6 hours a night.  Change equipment every 30 days or as directed by DME.  Wash your tubing with warm soap and water daily, hang to dry. Wash humidifier portion weekly. Use bottled, distilled water and change daily   Be aware of reduced alertness and do not drive or operate heavy machinery if experiencing this or drowsiness.  Exercise encouraged, as tolerated. Encouraged proper weight management.  Important to get eight or more hours of sleep  Limiting the use of the computer and television before bedtime.  Decrease naps during the day, so night time sleep will become enhanced.  Limit caffeine, and sleep deprivation.    Avoid Allergens and Irritants Avoid secondhand smoke Avoid SICK contacts Recommend  Masking  when appropriate Recommend Keep up-to-date with vaccinations

## 2024-04-12 NOTE — Progress Notes (Signed)
 Name: Brian Pearson MRN: [id number] DOB: Feb 01, 1936     CONSULTATION DATE: 04/12/2024  REFERRING MD :Broderick Canning   SYNOPSIS ESTABLISHED CARE FOR SEVERE SLEEP APNEA Patient diagnosed with severe sleep apnea AHI of 40 178 desaturation episodes 300 total episodes of apnea and hypopnea Excellent compliance report previous office visit 100%   CHIEF COMPLAINT:    Follow-up assessment for severe sleep apnea   HISTORY OF PRESENT ILLNESS: Compliance report reviewed in detail with patient Patient obtained a new machine will need to review download  Patient uses and benefits from therapy Using CPAP nightly and with naps Pressure setting is comfortable and is sleeping well. Compliance report reviewed in detail with patient 100% compliance for  30 days and greater than 4 hours Will change auto CPAP 4-16 Patient states that he needs more pressure previous setting auto CPAP 4-10 AHI down to 14 Patient with a new mask However will give him the magnetic ResMed AirFit F40 mask  More energy less fatigue No exacerbation at this time No evidence of heart failure at this time No evidence or signs of infection at this time No respiratory distress No fevers, chills, nausea, vomiting, diarrhea No evidence of lower extremity edema No evidence hemoptysis      PAST MEDICAL HISTORY :   has a past medical history of Arthritis, BPH with obstruction/lower urinary tract symptoms, GERD (gastroesophageal reflux disease), Hyperlipidemia, Hypertension, Obesity, Pneumonia, Sleep apnea, Urinary frequency, and Urine incontinence.  has a past surgical history that includes Hernia repair; Colonoscopy with propofol  (N/A, 02/09/2016); Esophagogastroduodenoscopy (egd) with propofol  (N/A, 02/09/2016); and Knee Arthroplasty (Left, 04/01/2021). Prior to Admission medications   Medication Sig Start Date End Date Taking? Authorizing Provider  amLODipine  (NORVASC ) 2.5 MG tablet TAKE 1 TABLET BY MOUTH ONCE DAILY  12/18/18   Dellar Fenton, MD  aspirin EC 81 MG tablet Take by mouth.    [provider]  Cholecalciferol  (VITAMIN D-3) 1000 units CAPS Take 1 capsule by mouth.    [provider]  Coenzyme Q10 (COQ10 PO) Take 1 capsule by mouth daily.    [provider]  fesoterodine  (TOVIAZ ) 4 MG TB24 tablet Take 1 tablet (4 mg total) by mouth daily. 05/22/18   Matilde Son A, PA-C  finasteride  (PROSCAR ) 5 MG tablet Take 1 tablet (5 mg total) by mouth daily. 05/01/19   Matilde Son A, PA-C  Multiple Vitamin (MULTI-VITAMINS) TABS Take by mouth.    [provider]  Omega-3 Fatty Acids (FISH OIL PO) Take by mouth.    [provider]  omeprazole  (PRILOSEC) 20 MG capsule TAKE 1 CAPSULE BY MOUTH ONCE DAILY 11/14/18   Dellar Fenton, MD  simvastatin  (ZOCOR ) 20 MG tablet TAKE 1 TABLET BY MOUTH AT BEDTIME NEEDS  APPOINTMENT  FOR  ADDITIONAL  OR  90  DAY  REFILLS 04/04/19   Dellar Fenton, MD  tamsulosin  (FLOMAX ) 0.4 MG CAPS capsule Take 1 capsule (0.4 mg total) by mouth daily. 05/01/19   Matilde Son A, PA-C   Allergies  Allergen Reactions   Oxycodone  Other (See Comments)    Patient felt as if he was "burning on the insides" and refuses to take again   Amoxicillin Rash   SOCIAL HISTORY:  reports that he has never smoked. He has never used smokeless tobacco. He reports current alcohol use. He reports that he does not use drugs.   BP (!) 146/80   Pulse 64   Ht 6\' 1"  (1.854 m)   Wt 222 lb 9.6 oz (  101 kg)   SpO2 97%   BMI 29.37 kg/m      Review of Systems: Gen:  Denies  fever, sweats, chills weight loss  HEENT: Denies blurred vision, double vision, ear pain, eye pain, hearing loss, nose bleeds, sore throat Cardiac:  No dizziness, chest pain or heaviness, chest tightness,edema, No JVD Resp:   No cough, -sputum production, -shortness of breath,-wheezing, -hemoptysis,  Other:  All other systems negative   Physical Examination:   General Appearance:  No distress  EYES PERRLA, EOM intact.   NECK Supple, No JVD Pulmonary: normal breath sounds, No wheezing.  CardiovascularNormal S1,S2.  No m/r/g.   Abdomen: Benign, Soft, non-tender. Neurology UE/LE 5/5 strength, no focal deficits Ext pulses intact, cap refill intact ALL OTHER ROS ARE NEGATIVE   ASSESSMENT AND PLAN SYNOPSIS 88 year old pleasant white male seen today for underlying severe sleep apnea  SEVERE OSA Assessment of OSA Previous AHI 40 Continue CPAP as prescribed  Excellent compliance report Reviewed compliance report in detail with patient Patient definitely benefits the use of CPAP therapy as prescribed Using CPAP nightly and with naps Pressure setting is comfortable and is sleeping well. CPAP prescription change to auto cpap 4-16 AHI reduced to 14  No evidence of acute heart failure at this time No respiratory distress No fevers, chills, nausea, vomiting, diarrhea No evidence hemoptysis  Patient Instructions Continue to use CPAP every night, minimum of 4-6 hours a night.  Change equipment every 30 days or as directed by DME.  Wash your tubing with warm soap and water daily, hang to dry. Wash humidifier portion weekly. Use bottled, distilled water and change daily   Be aware of reduced alertness and do not drive or operate heavy machinery if experiencing this or drowsiness.  Exercise encouraged, as tolerated. Encouraged proper weight management.  Important to get eight or more hours of sleep  Limiting the use of the computer and television before bedtime.  Decrease naps during the day, so night time sleep will become enhanced.  Limit caffeine, and sleep deprivation.  HTN, stroke, uncontrolled diabetes and heart failure are potential risk factors.  Risk of untreated sleep apnea including cardiac arrhthymias, stroke, DM, pulm HTN.   Hypertension - Sleep apnea can contribute to hypertension, therefore treatment of sleep apnea is important part of hypertension  management.    MEDICATION ADJUSTMENTS/LABS AND TESTS ORDERED: Referral to DME company  RESMED AIR FIT F40 MASK Change AUTO CPAP 4-16 cm h20 Recommend changing auto CPAP 4-16 Avoid Allergens and Irritants Avoid secondhand smoke Avoid SICK contacts Recommend  Masking  when appropriate Recommend Keep up-to-date with vaccinations    CURRENT MEDICATIONS REVIEWED AT LENGTH WITH PATIENT TODAY   Patient satisfied with Plan of action and management. All questions answered  Follow up in 8 weeks  Total time spent 41 minutes   Lady Pier, M.D.  [facility]  Medical Director [facility] Medical Director [facility] Cardio-Pulmonary Department

## 2024-04-17 DIAGNOSIS — G4733 Obstructive sleep apnea (adult) (pediatric): Secondary | ICD-10-CM | POA: Diagnosis not present

## 2024-04-23 ENCOUNTER — Other Ambulatory Visit: Payer: Self-pay | Admitting: Urology

## 2024-04-23 DIAGNOSIS — N3941 Urge incontinence: Secondary | ICD-10-CM

## 2024-05-08 DIAGNOSIS — R21 Rash and other nonspecific skin eruption: Secondary | ICD-10-CM | POA: Diagnosis not present

## 2024-05-09 ENCOUNTER — Encounter: Payer: Self-pay | Admitting: Urology

## 2024-05-09 ENCOUNTER — Encounter: Payer: Self-pay | Admitting: Internal Medicine

## 2024-05-09 ENCOUNTER — Ambulatory Visit: Payer: HMO | Admitting: Urology

## 2024-05-09 VITALS — BP 146/69 | HR 64 | Ht 72.0 in | Wt 220.2 lb

## 2024-05-09 DIAGNOSIS — N401 Enlarged prostate with lower urinary tract symptoms: Secondary | ICD-10-CM | POA: Diagnosis not present

## 2024-05-09 DIAGNOSIS — N3941 Urge incontinence: Secondary | ICD-10-CM | POA: Diagnosis not present

## 2024-05-09 DIAGNOSIS — N138 Other obstructive and reflux uropathy: Secondary | ICD-10-CM

## 2024-05-09 LAB — BLADDER SCAN AMB NON-IMAGING

## 2024-05-09 MED ORDER — OXYBUTYNIN CHLORIDE ER 15 MG PO TB24: 15.0000 mg | ORAL_TABLET | Freq: Every day | ORAL | 3 refills | Status: AC

## 2024-05-09 MED ORDER — FINASTERIDE 5 MG PO TABS: 5.0000 mg | ORAL_TABLET | Freq: Every day | ORAL | 3 refills | Status: AC

## 2024-05-09 NOTE — Progress Notes (Signed)
 05/09/24 10:42 AM   Brian Pearson [birth year] [id number]  Referring provider:  Dellar Fenton, MD [address] [place],  [place] [address]  Urological history:  1. BPH with LU TS  -PSA 1.24 in 06/2021  -tamsulosin  0.4 mg discontinued due to dizziness -finasteride  5 mg daily     2. Nocturia  -improved with use of CPAP     3. ED  -contributing factors of age, BPH, HTN and HLD  -not sexually active     4. Urinary retention  -post-op 03/2020   5. Urge incontinence -Contributing factors of age, hypertension, sleep apnea, BPH and hyperglycemia -Oxybutynin  XL 10 mg daily  Follow-up   HPI: Brian Pearson is a 88 y.o.male who presents today for yearly follow up.    Previous records reviewed.     I PSS 16/2  PVR 0 mL   He is still having issues with some urge incontinence and frequency.  He is tolerating the oxybutynin  XL 10 mg daily.  He does not have any significant dry eye, dry mouth or constipation.  Patient denies any modifying or aggravating factors.  Patient denies any recent UTI's, gross hematuria, dysuria or suprapubic/flank pain.  Patient denies any fevers, chills, nausea or vomiting.     IPSS     Row Name 05/09/24 1000         International Prostate Symptom Score   How often have you had the sensation of not emptying your bladder? Less than half the time     How often have you had to urinate less than every two hours? More than half the time     How often have you found you stopped and started again several times when you urinated? Less than 1 in 5 times     How often have you found it difficult to postpone urination? More than half the time     How often have you had a weak urinary stream? Less than half the time     How often have you had to strain to start urination? Less than 1 in 5 times     How many times did you typically get up at night to urinate? 2 Times     Total IPSS Score 16       Quality of Life due to urinary  symptoms   If you were to spend the rest of your life with your urinary condition just the way it is now how would you feel about that? Mostly Satisfied                 Score:  1-7 Mild 8-19 Moderate 20-35 Severe   PMH: Past Medical History:  Diagnosis Date   Arthritis    BPH with obstruction/lower urinary tract symptoms    GERD (gastroesophageal reflux disease)    Hyperlipidemia    taken off simvastatin  last year   Hypertension    Obesity    Pneumonia    hospitalized    Sleep apnea    uses cpap machine    Urinary frequency    Urine incontinence     Surgical History: Past Surgical History:  Procedure Laterality Date   COLONOSCOPY WITH PROPOFOL  N/A 02/09/2016   Procedure: COLONOSCOPY WITH PROPOFOL ;  Surgeon: Stephens Eis, MD;  Location: [facility];  Service: Gastroenterology;  Laterality: N/A;   ESOPHAGOGASTRODUODENOSCOPY (EGD) WITH PROPOFOL  N/A 02/09/2016   Procedure: ESOPHAGOGASTRODUODENOSCOPY (EGD) WITH PROPOFOL ;  Surgeon: Stephens Eis, MD;  Location: [facility];  Service: Gastroenterology;  Laterality: N/A;   HERNIA REPAIR     1990's - 3 surgeries   KNEE ARTHROPLASTY Left 04/01/2021   Procedure: COMPUTER ASSISTED TOTAL KNEE ARTHROPLASTY;  Surgeon: Arlyne Lame, MD;  Location: [facility];  Service: Orthopedics;  Laterality: Left;    Home Medications:  Allergies as of 05/09/2024       Reactions   Oxycodone  Other (See Comments)   Patient felt as if he was "burning on the insides" and refuses to take again   Amoxicillin Rash        Medication List        Accurate as of May 09, 2024 10:42 AM. If you have any questions, ask your nurse or doctor.          STOP taking these medications    Alpha Lipoic Acid 200 MG Caps Stopped by: Janya Eveland       TAKE these medications    amLODipine  10 MG tablet Commonly known as: NORVASC  TAKE ONE TABLET BY MOUTH EVERY DAY   COQ10 PO Take 1 capsule by mouth daily.   Cyanocobalamin  5000 MCG  Subl Place 5,000 mcg under the tongue daily.   finasteride  5 MG tablet Commonly known as: PROSCAR  Take 1 tablet (5 mg total) by mouth daily.   FISH OIL PO Take 1 capsule by mouth daily.   losartan  50 MG tablet Commonly known as: COZAAR  TAKE ONE TABLET BY MOUTH EVERY DAY   Multi-Vitamins Tabs Take 1 tablet by mouth daily.   omeprazole  20 MG capsule Commonly known as: PRILOSEC Take 1 capsule (20 mg total) by mouth daily.   oxybutynin  15 MG 24 hr tablet Commonly known as: DITROPAN  XL Take 1 tablet (15 mg total) by mouth daily. What changed:  medication strength how much to take Changed by: Elajah Kunsman   simvastatin  20 MG tablet Commonly known as: ZOCOR  TAKE ONE TABLET BY MOUTH AT BEDTIME. APPOINTMENT NEEDED FOR FUTURE REFILLS.        Allergies:  Allergies  Allergen Reactions   Oxycodone  Other (See Comments)    Patient felt as if he was "burning on the insides" and refuses to take again   Amoxicillin Rash    Family History: Family History  Problem Relation Age of Onset   Arthritis Mother    Heart disease Mother        CHF   Cancer Father        Prostate   Dementia Father    Alzheimer's disease Father    Hypotension Sister    Kidney cancer Neg Hx    Bladder Cancer Neg Hx     Social History:  reports that he has never smoked. He has never used smokeless tobacco. He reports current alcohol use. He reports that he does not use drugs.   Physical Exam: BP (!) 146/69   Pulse 64   Ht 6' (1.829 m)   Wt 220 lb 3.2 oz (99.9 kg)   BMI 29.86 kg/m   Constitutional:  Well nourished. Alert and oriented, No acute distress. HEENT: [place] AT, moist mucus membranes.  Trachea midline Cardiovascular: No clubbing, cyanosis, or edema. Respiratory: Normal respiratory effort, no increased work of breathing. Neurologic: Grossly intact, no focal deficits, moving all 4 extremities. Psychiatric: Normal mood and affect.   Laboratory Data: Component     Latest Ref Rng  02/20/2024  Hemoglobin A1C     4.6 - 6.5 % 6.1    BMET    Component Value Date/Time  NA 141 02/20/2024 0804   K 4.0 02/20/2024 0804   CL 106 02/20/2024 0804   CO2 27 02/20/2024 0804   GLUCOSE 100 (H) 02/20/2024 0804   BUN 12 02/20/2024 0804   CREATININE 0.86 02/20/2024 0804   CALCIUM 9.2 02/20/2024 0804   GFRNONAA >60 04/13/2021 0945       Urinalysis w/Microscopic Order: [id number] Component Ref Range & Units 2 mo ago  Color Colorless, Straw, Light Yellow, Yellow, Dark Yellow Colorless  Clarity Clear Clear  Specific Gravity 1.005 - 1.030 1.006  pH, Urine 5.0 - 8.0 6  Protein, Urinalysis Negative mg/dL Negative  Glucose, Urinalysis Negative mg/dL Negative  Ketones, Urinalysis Negative mg/dL Negative  Blood, Urinalysis Negative Negative  Nitrite, Urinalysis Negative Negative  Leukocyte Esterase, Urinalysis Negative Negative  Bilirubin, Urinalysis Negative Negative  Urobilinogen, Urinalysis 0.2 - 1.0 mg/dL 0.2  WBC, UA <=5 /hpf <1  Red Blood Cells, Urinalysis <=3 /hpf <1  Bacteria, Urinalysis 0 - 5 /hpf 0-5  Squamous Epithelial Cells, Urinalysis /hpf 0  Resulting Agency [facility]   Specimen Collected: 03/01/24 14:57   Performed by: [facility] Last Resulted: 03/01/24 15:14  Received From: [facility]  Result Received: 03/21/24 12:59  I have reviewed the labs.  See HPI.     Pertinent Imaging:  05/09/24 10:05  Scan Result 0ml     Assessment & Plan:    1. BPH with LUTS -aged out of screening -PVR < 300 cc -symptoms - mild urge incontinence -continue conservative management, avoiding bladder irritants and timed voiding's -continue finasteride  5 mg daily    2. Urge incontinence - not at goal with oxybutynin  XL 10 mg daily - will increase oxybutynin  XL to 15 mg daily, will call with results   Return in about 1 year (around 05/09/2025) for I PSS and PVR .  Briant Camper  [facility] [address] [place], [place] [address] [phone]

## 2024-05-17 DIAGNOSIS — D2271 Melanocytic nevi of right lower limb, including hip: Secondary | ICD-10-CM | POA: Diagnosis not present

## 2024-05-17 DIAGNOSIS — D2272 Melanocytic nevi of left lower limb, including hip: Secondary | ICD-10-CM | POA: Diagnosis not present

## 2024-05-17 DIAGNOSIS — X32XXXA Exposure to sunlight, initial encounter: Secondary | ICD-10-CM | POA: Diagnosis not present

## 2024-05-17 DIAGNOSIS — D2261 Melanocytic nevi of right upper limb, including shoulder: Secondary | ICD-10-CM | POA: Diagnosis not present

## 2024-05-17 DIAGNOSIS — D2262 Melanocytic nevi of left upper limb, including shoulder: Secondary | ICD-10-CM | POA: Diagnosis not present

## 2024-05-17 DIAGNOSIS — L570 Actinic keratosis: Secondary | ICD-10-CM | POA: Diagnosis not present

## 2024-05-17 DIAGNOSIS — L821 Other seborrheic keratosis: Secondary | ICD-10-CM | POA: Diagnosis not present

## 2024-05-17 DIAGNOSIS — D225 Melanocytic nevi of trunk: Secondary | ICD-10-CM | POA: Diagnosis not present

## 2024-06-13 ENCOUNTER — Ambulatory Visit: Admitting: Internal Medicine

## 2024-06-13 ENCOUNTER — Encounter: Payer: Self-pay | Admitting: Internal Medicine

## 2024-06-13 VITALS — BP 130/80 | HR 66 | Temp 97.9°F | Ht 72.0 in | Wt 218.2 lb

## 2024-06-13 DIAGNOSIS — G4733 Obstructive sleep apnea (adult) (pediatric): Secondary | ICD-10-CM

## 2024-06-13 NOTE — Progress Notes (Signed)
 Name: Brian Pearson MRN: [id number] DOB: [birth year]     CONSULTATION DATE: 06/13/2024  REFERRING MD :Glendia Glendia   SYNOPSIS ESTABLISHED CARE FOR SEVERE SLEEP APNEA Patient diagnosed with severe sleep apnea AHI of 40 178 desaturation episodes 300 total episodes of apnea and hypopnea Excellent compliance report previous office visit 100%   CHIEF COMPLAINT:   Follow-up assessment for severe sleep apnea  HISTORY OF PRESENT ILLNESS: Discussed sleep data and reviewed with patient.  Encouraged proper weight management.  Discussed driving precautions and its relationship with hypersomnolence.  Discussed sleep hygiene, and benefits of a fixed sleep waked time.  The importance of getting eight or more hours of sleep discussed with patient.  Discussed limiting the use of the computer and television before bedtime.  Decrease naps during the day, so night time sleep will become enhanced.  Limit caffeine, and sleep deprivation.   Patient uses and benefits from therapy Using CPAP nightly and with naps Pressure setting is comfortable and is sleeping well. 100% compliance for  30 days and greater than 4 hours Previous auto CPAP 4-10 Changed to auto CPAP 4-16 magnetic ResMed AirFit F40 mask AHI remains elevated at 15.8 I have recommended that we fine-tune his CPAP may even consider BiPAP and assess an in-lab CPAP titration study  More energy less fatigue No exacerbation at this time No evidence of heart failure at this time No evidence or signs of infection at this time No respiratory distress No fevers, chills, nausea, vomiting, diarrhea No evidence of lower extremity edema No evidence hemoptysis       PAST MEDICAL HISTORY :   has a past medical history of Arthritis, BPH with obstruction/lower urinary tract symptoms, GERD (gastroesophageal reflux disease), Hyperlipidemia, Hypertension, Obesity, Pneumonia, Sleep apnea, Urinary frequency, and Urine incontinence.  has a past  surgical history that includes Hernia repair; Colonoscopy with propofol  (N/A, 02/09/2016); Esophagogastroduodenoscopy (egd) with propofol  (N/A, 02/09/2016); and Knee Arthroplasty (Left, 04/01/2021). Prior to Admission medications   Medication Sig Start Date End Date Taking? Authorizing Provider  amLODipine  (NORVASC ) 2.5 MG tablet TAKE 1 TABLET BY MOUTH ONCE DAILY 12/18/18   Glendia Shad, MD  aspirin EC 81 MG tablet Take by mouth.    [provider]  Cholecalciferol  (VITAMIN D-3) 1000 units CAPS Take 1 capsule by mouth.    [provider]  Coenzyme Q10 (COQ10 PO) Take 1 capsule by mouth daily.    [provider]  fesoterodine  (TOVIAZ ) 4 MG TB24 tablet Take 1 tablet (4 mg total) by mouth daily. 05/22/18   Helon Kirsch A, PA-C  finasteride  (PROSCAR ) 5 MG tablet Take 1 tablet (5 mg total) by mouth daily. 05/01/19   Helon Kirsch A, PA-C  Multiple Vitamin (MULTI-VITAMINS) TABS Take by mouth.    [provider]  Omega-3 Fatty Acids (FISH OIL PO) Take by mouth.    [provider]  omeprazole  (PRILOSEC) 20 MG capsule TAKE 1 CAPSULE BY MOUTH ONCE DAILY 11/14/18   Glendia Shad, MD  simvastatin  (ZOCOR ) 20 MG tablet TAKE 1 TABLET BY MOUTH AT BEDTIME NEEDS  APPOINTMENT  FOR  ADDITIONAL  OR  90  DAY  REFILLS 04/04/19   Glendia Shad, MD  tamsulosin  (FLOMAX ) 0.4 MG CAPS capsule Take 1 capsule (0.4 mg total) by mouth daily. 05/01/19   Helon Kirsch A, PA-C   Allergies  Allergen Reactions   Oxycodone  Other (See Comments)    Patient felt as if he was burning on the insides and refuses to take again  Amoxicillin Rash   SOCIAL HISTORY:  reports that he has never smoked. He has never used smokeless tobacco. He reports current alcohol use. He reports that he does not use drugs.  BP 130/80 (BP Location: Right Arm, Patient Position: Sitting, Cuff Size: Large)   Pulse 66   Temp 97.9 F (36.6 C) (Oral)   Ht 6' (1.829 m)   Wt 218 lb 3.2 oz (99 kg)   SpO2  96%   BMI 29.59 kg/m   Review of Systems: Gen:  Denies  fever, sweats, chills weight loss  HEENT: Denies blurred vision, double vision, ear pain, eye pain, hearing loss, nose bleeds, sore throat Cardiac:  No dizziness, chest pain or heaviness, chest tightness,edema, No JVD Resp:   No cough, -sputum production, -shortness of breath,-wheezing, -hemoptysis,  Other:  All other systems negative   Physical Examination:   General Appearance: No distress  EYES PERRLA, EOM intact.   NECK Supple, No JVD Pulmonary: normal breath sounds, No wheezing.  CardiovascularNormal S1,S2.  No m/r/g.   Abdomen: Benign, Soft, non-tender. Neurology UE/LE 5/5 strength, no focal deficits Ext pulses intact, cap refill intact ALL OTHER ROS ARE NEGATIVE    ASSESSMENT AND PLAN SYNOPSIS 88 year old pleasant white male seen today for underlying severe sleep apnea  SEVERE OSA Assessment of OSA Previous AHI 40 Continue CPAP as prescribed  Excellent compliance report Reviewed compliance report in detail with patient Patient definitely benefits the use of CPAP therapy as prescribed Using CPAP nightly and with naps Pressure setting is comfortable and is sleeping well. CPAP prescription 4-16 AHI reduced to 15.8 I recommend in-lab CPAP titration study, patient is aware and is agreeable  No evidence of acute heart failure at this time No respiratory distress No fevers, chills, nausea, vomiting, diarrhea No evidence hemoptysis  Patient Instructions Continue to use CPAP every night, minimum of 4-6 hours a night.  Change equipment every 30 days or as directed by DME.  Wash your tubing with warm soap and water daily, hang to dry. Wash humidifier portion weekly. Use bottled, distilled water and change daily   Be aware of reduced alertness and do not drive or operate heavy machinery if experiencing this or drowsiness.  Exercise encouraged, as tolerated. Encouraged proper weight management.  Important to get  eight or more hours of sleep  Limiting the use of the computer and television before bedtime.  Decrease naps during the day, so night time sleep will become enhanced.  Limit caffeine, and sleep deprivation.  HTN, stroke, uncontrolled diabetes and heart failure are potential risk factors.  Risk of untreated sleep apnea including cardiac arrhthymias, stroke, DM, pulm HTN.    Hypertension - Sleep apnea can contribute to hypertension, therefore treatment of sleep apnea is important part of hypertension management.     MEDICATION ADJUSTMENTS/LABS AND TESTS ORDERED: Recommend in-lab CPAP titration study Continue CPAP as prescribed Avoid Allergens and Irritants Avoid secondhand smoke Avoid SICK contacts Recommend  Masking  when appropriate Recommend Keep up-to-date with vaccinations    CURRENT MEDICATIONS REVIEWED AT LENGTH WITH PATIENT TODAY   Patient  satisfied with Plan of action and management. All questions answered   Follow up 4 months   I spent a total of 42 minutes reviewing chart data, face-to-face evaluation with the patient, counseling and coordination of care as detailed above.      Nickolas Alm Cellar, M.D.  [facility]  Medical Director [facility] Medical Director [facility] Cardio-Pulmonary Department

## 2024-06-13 NOTE — Patient Instructions (Signed)
 Recommend CPAP in LAB titration study to fine tune your sleep apnea   Excellent Job A+ GOLD STAR!!  Continue CPAP as prescribed  Patient Instructions Continue to use CPAP every night, minimum of 4-6 hours a night.  Change equipment every 30 days or as directed by DME.  Wash your tubing with warm soap and water daily, hang to dry. Wash humidifier portion weekly. Use bottled, distilled water and change daily   Be aware of reduced alertness and do not drive or operate heavy machinery if experiencing this or drowsiness.  Exercise encouraged, as tolerated. Encouraged proper weight management.  Important to get eight or more hours of sleep  Limiting the use of the computer and television before bedtime.  Decrease naps during the day, so night time sleep will become enhanced.  Limit caffeine, and sleep deprivation.    Avoid Allergens and Irritants Avoid secondhand smoke Avoid SICK contacts Recommend  Masking  when appropriate Recommend Keep up-to-date with vaccinations

## 2024-06-18 DIAGNOSIS — D3131 Benign neoplasm of right choroid: Secondary | ICD-10-CM | POA: Diagnosis not present

## 2024-06-18 DIAGNOSIS — H2513 Age-related nuclear cataract, bilateral: Secondary | ICD-10-CM | POA: Diagnosis not present

## 2024-06-26 ENCOUNTER — Other Ambulatory Visit (INDEPENDENT_AMBULATORY_CARE_PROVIDER_SITE_OTHER)

## 2024-06-26 DIAGNOSIS — R739 Hyperglycemia, unspecified: Secondary | ICD-10-CM

## 2024-06-26 DIAGNOSIS — I10 Essential (primary) hypertension: Secondary | ICD-10-CM | POA: Diagnosis not present

## 2024-06-26 DIAGNOSIS — E785 Hyperlipidemia, unspecified: Secondary | ICD-10-CM

## 2024-06-26 LAB — CBC WITH DIFFERENTIAL/PLATELET
Basophils Absolute: 0 K/uL (ref 0.0–0.1)
Basophils Relative: 0.7 % (ref 0.0–3.0)
Eosinophils Absolute: 0.2 K/uL (ref 0.0–0.7)
Eosinophils Relative: 4 % (ref 0.0–5.0)
HCT: 41.7 % (ref 39.0–52.0)
Hemoglobin: 13.8 g/dL (ref 13.0–17.0)
Lymphocytes Relative: 31.5 % (ref 12.0–46.0)
Lymphs Abs: 1.7 K/uL (ref 0.7–4.0)
MCHC: 33.2 g/dL (ref 30.0–36.0)
MCV: 92.5 fl (ref 78.0–100.0)
Monocytes Absolute: 0.6 K/uL (ref 0.1–1.0)
Monocytes Relative: 10.5 % (ref 3.0–12.0)
Neutro Abs: 2.9 K/uL (ref 1.4–7.7)
Neutrophils Relative %: 53.3 % (ref 43.0–77.0)
Platelets: 207 K/uL (ref 150.0–400.0)
RBC: 4.51 Mil/uL (ref 4.22–5.81)
RDW: 13.9 % (ref 11.5–15.5)
WBC: 5.5 K/uL (ref 4.0–10.5)

## 2024-06-26 LAB — LIPID PANEL
Cholesterol: 149 mg/dL (ref 0–200)
HDL: 56.3 mg/dL (ref >39.00)
LDL Cholesterol: 67 mg/dL (ref 0–99)
NonHDL: 93.04
Total CHOL/HDL Ratio: 3
Triglycerides: 129 mg/dL (ref 0.0–149.0)
VLDL: 25.8 mg/dL (ref 0.0–40.0)

## 2024-06-26 LAB — BASIC METABOLIC PANEL WITH GFR
BUN: 14 mg/dL (ref 6–23)
CO2: 28 meq/L (ref 19–32)
Calcium: 9.5 mg/dL (ref 8.4–10.5)
Chloride: 106 meq/L (ref 96–112)
Creatinine, Ser: 0.97 mg/dL (ref 0.40–1.50)
GFR: 70.8 mL/min (ref >60.00)
Glucose, Bld: 100 mg/dL — ABNORMAL HIGH (ref 70–99)
Potassium: 4.2 meq/L (ref 3.5–5.1)
Sodium: 141 meq/L (ref 135–145)

## 2024-06-26 LAB — HEPATIC FUNCTION PANEL
ALT: 14 U/L (ref 0–53)
AST: 18 U/L (ref 0–37)
Albumin: 4.5 g/dL (ref 3.5–5.2)
Alkaline Phosphatase: 69 U/L (ref 39–117)
Bilirubin, Direct: 0.1 mg/dL (ref 0.0–0.3)
Total Bilirubin: 0.5 mg/dL (ref 0.2–1.2)
Total Protein: 6.8 g/dL (ref 6.0–8.3)

## 2024-06-26 LAB — TSH: TSH: 3.66 u[IU]/mL (ref 0.35–5.50)

## 2024-06-26 LAB — HEMOGLOBIN A1C: Hgb A1c MFr Bld: 6.1 % (ref 4.6–6.5)

## 2024-06-27 ENCOUNTER — Ambulatory Visit: Payer: Self-pay | Admitting: Internal Medicine

## 2024-06-28 ENCOUNTER — Ambulatory Visit (INDEPENDENT_AMBULATORY_CARE_PROVIDER_SITE_OTHER): Admitting: Internal Medicine

## 2024-06-28 ENCOUNTER — Encounter: Payer: Self-pay | Admitting: Internal Medicine

## 2024-06-28 VITALS — BP 132/70 | HR 83 | Temp 98.0°F | Resp 16 | Ht 72.0 in | Wt 220.0 lb

## 2024-06-28 DIAGNOSIS — D649 Anemia, unspecified: Secondary | ICD-10-CM | POA: Diagnosis not present

## 2024-06-28 DIAGNOSIS — R1310 Dysphagia, unspecified: Secondary | ICD-10-CM

## 2024-06-28 DIAGNOSIS — R739 Hyperglycemia, unspecified: Secondary | ICD-10-CM | POA: Diagnosis not present

## 2024-06-28 DIAGNOSIS — E785 Hyperlipidemia, unspecified: Secondary | ICD-10-CM | POA: Diagnosis not present

## 2024-06-28 DIAGNOSIS — I10 Essential (primary) hypertension: Secondary | ICD-10-CM | POA: Diagnosis not present

## 2024-06-28 DIAGNOSIS — G4733 Obstructive sleep apnea (adult) (pediatric): Secondary | ICD-10-CM | POA: Diagnosis not present

## 2024-06-28 MED ORDER — OMEPRAZOLE 20 MG PO CPDR: 20.0000 mg | DELAYED_RELEASE_CAPSULE | Freq: Every day | ORAL | 3 refills | Status: AC

## 2024-06-28 MED ORDER — LOSARTAN POTASSIUM 50 MG PO TABS: 50.0000 mg | ORAL_TABLET | Freq: Every day | ORAL | 2 refills | Status: DC

## 2024-06-28 MED ORDER — AMLODIPINE BESYLATE 10 MG PO TABS: 10.0000 mg | ORAL_TABLET | Freq: Every day | ORAL | 2 refills | Status: DC

## 2024-06-28 NOTE — Progress Notes (Signed)
 Subjective:    Patient ID: Brian Pearson, male    DOB: 30-May-1936, 88 y.o.   MRN: [id number]  Patient here for  Chief Complaint  Patient presents with   Medical Management of Chronic Issues    HPI Here for a scheduled follow up - follow up regarding hypertension and hypercholesterolemia. Continues on losartan  and amlodipine . Had f/u with pulmonary 06/13/24 - continue cpap. Follow up with urology 05/09/24 - recommended to continue finasteride  5mg  q day and increase oxybutynin  to 15mg  daily. Reports he is doing relatively well. Breathing stable. No chest pain. No abdominal pain or bowel change reported.    Past Medical History:  Diagnosis Date   Arthritis    BPH with obstruction/lower urinary tract symptoms    GERD (gastroesophageal reflux disease)    Hyperlipidemia    taken off simvastatin  last year   Hypertension    Obesity    Pneumonia    hospitalized    Sleep apnea    uses cpap machine    Urinary frequency    Urine incontinence    Past Surgical History:  Procedure Laterality Date   COLONOSCOPY WITH PROPOFOL  N/A 02/09/2016   Procedure: COLONOSCOPY WITH PROPOFOL ;  Surgeon: Deward CINDERELLA Piedmont, MD;  Location: [facility] ENDOSCOPY;  Service: Gastroenterology;  Laterality: N/A;   ESOPHAGOGASTRODUODENOSCOPY (EGD) WITH PROPOFOL  N/A 02/09/2016   Procedure: ESOPHAGOGASTRODUODENOSCOPY (EGD) WITH PROPOFOL ;  Surgeon: Deward CINDERELLA Piedmont, MD;  Location: [facility] ENDOSCOPY;  Service: Gastroenterology;  Laterality: N/A;   HERNIA REPAIR     1990's - 3 surgeries   KNEE ARTHROPLASTY Left 04/01/2021   Procedure: COMPUTER ASSISTED TOTAL KNEE ARTHROPLASTY;  Surgeon: Mardee Lynwood SQUIBB, MD;  Location: [facility] ORS;  Service: Orthopedics;  Laterality: Left;   Family History  Problem Relation Age of Onset   Arthritis Mother    Heart disease Mother        CHF   Cancer Father        Prostate   Dementia Father    Alzheimer's disease Father    Hypotension Sister    Kidney cancer Neg Hx    Bladder Cancer Neg Hx    Social  History   Socioeconomic History   Marital status: Married    Spouse name: Not on file   Number of children: Not on file   Years of education: Not on file   Highest education level: Not on file  Occupational History   Not on file  Tobacco Use   Smoking status: Never   Smokeless tobacco: Never  Vaping Use   Vaping status: Never Used  Substance and Sexual Activity   Alcohol use: Yes    Alcohol/week: 0.0 standard drinks of alcohol    Comment: rarely    Drug use: No   Sexual activity: Yes  Other Topics Concern   Not on file  Social History Narrative   Lives in [place].      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   Social Drivers of [employer]: Low Risk  (10/14/2023)   Overall Financial Resource Strain (CARDIA)    Difficulty of Paying Living Expenses: Not hard at all  Food Insecurity: No Food Insecurity (10/14/2023)   Hunger Vital Sign    Worried About Running Out of  Food in the Last Year: Never true    Ran Out of Food in the Last Year: Never true  Transportation Needs: No Transportation Needs (10/14/2023)   PRAPARE - Administrator, Civil Service (Medical): No    Lack of Transportation (Non-Medical): No  Physical Activity: Insufficiently Active (10/14/2023)   Exercise Vital Sign    Days of Exercise per Week: 3 days    Minutes of Exercise per Session: 20 min  Stress: No Stress Concern Present (10/14/2023)   [employer] of Occupational Health - Occupational Stress Questionnaire    Feeling of Stress : Only a little  Social Connections: Socially Integrated (10/14/2023)   Social Connection and Isolation Panel    Frequency of Communication with Friends and Family: Three times a week    Frequency of Social Gatherings with Friends and Family: Twice a week    Attends Religious Services: More than  4 times per year    Active Member of [employer] or Organizations: Yes    Attends Engineer, structural: More than 4 times per year    Marital Status: Married     Review of Systems  Constitutional:  Negative for appetite change and unexpected weight change.  HENT:  Negative for congestion and sinus pressure.   Respiratory:  Negative for cough, chest tightness and shortness of breath.   Cardiovascular:  Negative for chest pain, palpitations and leg swelling.  Gastrointestinal:  Negative for abdominal pain, diarrhea, nausea and vomiting.  Genitourinary:  Negative for dysuria.       Seeing urology for urination issues as outlined.   Musculoskeletal:  Negative for joint swelling and myalgias.  Skin:  Negative for color change and rash.  Neurological:  Negative for dizziness and headaches.  Psychiatric/Behavioral:  Negative for agitation and dysphoric mood.        Objective:     BP 132/70   Pulse 83   Temp 98 F (36.7 C)   Resp 16   Ht 6' (1.829 m)   Wt 220 lb (99.8 kg)   SpO2 98%   BMI 29.84 kg/m  Wt Readings from Last 3 Encounters:  06/28/24 220 lb (99.8 kg)  06/13/24 218 lb 3.2 oz (99 kg)  05/09/24 220 lb 3.2 oz (99.9 kg)    Physical Exam Vitals reviewed.  Constitutional:      General: He is not in acute distress.    Appearance: Normal appearance. He is well-developed.  HENT:     Head: Normocephalic and atraumatic.     Right Ear: External ear normal.     Left Ear: External ear normal.     Mouth/Throat:     Pharynx: No oropharyngeal exudate or posterior oropharyngeal erythema.  Eyes:     General: No scleral icterus.       Right eye: No discharge.        Left eye: No discharge.     Conjunctiva/sclera: Conjunctivae normal.  Cardiovascular:     Rate and Rhythm: Normal rate and regular rhythm.  Pulmonary:     Effort: Pulmonary effort is normal. No respiratory distress.     Breath sounds: Normal breath sounds.  Abdominal:     General: Bowel sounds are normal.      Palpations: Abdomen is soft.     Tenderness: There is no abdominal tenderness.  Musculoskeletal:        General: No swelling or tenderness.     Cervical back: Neck supple. No tenderness.  Lymphadenopathy:     Cervical:  No cervical adenopathy.  Skin:    Findings: No erythema or rash.  Neurological:     Mental Status: He is alert.  Psychiatric:        Mood and Affect: Mood normal.        Behavior: Behavior normal.         Outpatient Encounter Medications as of 06/28/2024  Medication Sig   Coenzyme Q10 (COQ10 PO) Take 1 capsule by mouth daily.   Cyanocobalamin  5000 MCG SUBL Place 5,000 mcg under the tongue daily.   finasteride  (PROSCAR ) 5 MG tablet Take 1 tablet (5 mg total) by mouth daily.   Multiple Vitamin (MULTI-VITAMINS) TABS Take 1 tablet by mouth daily.   Omega-3 Fatty Acids (FISH OIL PO) Take 1 capsule by mouth daily.   oxybutynin  (DITROPAN  XL) 15 MG 24 hr tablet Take 1 tablet (15 mg total) by mouth daily.   simvastatin  (ZOCOR ) 20 MG tablet TAKE ONE TABLET BY MOUTH AT BEDTIME. APPOINTMENT NEEDED FOR FUTURE REFILLS.   triamcinolone  cream (KENALOG ) 0.1 % Apply topically 2 (two) times daily as needed.   amLODipine  (NORVASC ) 10 MG tablet Take 1 tablet (10 mg total) by mouth daily.   losartan  (COZAAR ) 50 MG tablet Take 1 tablet (50 mg total) by mouth daily.   omeprazole  (PRILOSEC) 20 MG capsule Take 1 capsule (20 mg total) by mouth daily.   [DISCONTINUED] amLODipine  (NORVASC ) 10 MG tablet TAKE ONE TABLET BY MOUTH EVERY DAY   [DISCONTINUED] losartan  (COZAAR ) 50 MG tablet TAKE ONE TABLET BY MOUTH EVERY DAY   [DISCONTINUED] omeprazole  (PRILOSEC) 20 MG capsule Take 1 capsule (20 mg total) by mouth daily.   No facility-administered encounter medications on file as of 06/28/2024.     Lab Results  Component Value Date   WBC 5.5 06/26/2024   HGB 13.8 06/26/2024   HCT 41.7 06/26/2024   PLT 207.0 06/26/2024   GLUCOSE 100 (H) 06/26/2024   CHOL 149 06/26/2024   TRIG 129.0  06/26/2024   HDL 56.30 06/26/2024   LDLCALC 67 06/26/2024   ALT 14 06/26/2024   AST 18 06/26/2024   NA 141 06/26/2024   K 4.2 06/26/2024   CL 106 06/26/2024   CREATININE 0.97 06/26/2024   BUN 14 06/26/2024   CO2 28 06/26/2024   TSH 3.66 06/26/2024   PSA 1.24 06/26/2021   INR 1.0 03/23/2021   HGBA1C 6.1 06/26/2024       Assessment & Plan:  Anemia, unspecified type Assessment & Plan: Follow cbc.    Hyperlipidemia, unspecified hyperlipidemia type Assessment & Plan: On simvastatin .  Low-cholesterol diet and exercise.  Follow lipid panel.  Lab Results  Component Value Date   CHOL 149 06/26/2024   HDL 56.30 06/26/2024   LDLCALC 67 06/26/2024   TRIG 129.0 06/26/2024   CHOLHDL 3 06/26/2024     Orders: -     Lipid panel; Future -     Hepatic function panel; Future  Hyperglycemia Assessment & Plan: Follow met b and A1c.   Lab Results  Component Value Date   HGBA1C 6.1 06/26/2024     Orders: -     Hemoglobin A1c; Future  Essential hypertension Assessment & Plan: Continue losartan  and amlodipine .  Pressure as outlined.  No changes in medication today.  Follow pressures. Follow metabolic panel.   Orders: -     Basic metabolic panel with GFR; Future  Dysphagia, unspecified type Assessment & Plan: S/p EGD with dilatation. No upper symptoms reported.   Orders: -  Omeprazole ; Take 1 capsule (20 mg total) by mouth daily.  Dispense: 90 capsule; Refill: 3  OSA on CPAP Assessment & Plan: Continue cpap.    Other orders -     amLODIPine  Besylate; Take 1 tablet (10 mg total) by mouth daily.  Dispense: 90 tablet; Refill: 2 -     Losartan  Potassium; Take 1 tablet (50 mg total) by mouth daily.  Dispense: 90 tablet; Refill: 2     Allena Hamilton, MD

## 2024-06-30 DIAGNOSIS — G4733 Obstructive sleep apnea (adult) (pediatric): Secondary | ICD-10-CM | POA: Diagnosis not present

## 2024-07-02 ENCOUNTER — Encounter: Payer: Self-pay | Admitting: Internal Medicine

## 2024-07-02 NOTE — Assessment & Plan Note (Signed)
 Continue cpap.

## 2024-07-02 NOTE — Assessment & Plan Note (Signed)
 On simvastatin .  Low-cholesterol diet and exercise.  Follow lipid panel.  Lab Results  Component Value Date   CHOL 149 06/26/2024   HDL 56.30 06/26/2024   LDLCALC 67 06/26/2024   TRIG 129.0 06/26/2024   CHOLHDL 3 06/26/2024

## 2024-07-02 NOTE — Assessment & Plan Note (Signed)
 S/p EGD with dilatation. No upper symptoms reported.

## 2024-07-02 NOTE — Assessment & Plan Note (Signed)
 Follow cbc.

## 2024-07-02 NOTE — Assessment & Plan Note (Signed)
 Continue losartan and amlodipine.  Pressure as outlined.  No changes in medication today.  Follow pressures.  Follow metabolic panel.

## 2024-07-02 NOTE — Assessment & Plan Note (Signed)
 Follow met b and A1c.   Lab Results  Component Value Date   HGBA1C 6.1 06/26/2024

## 2024-07-09 DIAGNOSIS — H2512 Age-related nuclear cataract, left eye: Secondary | ICD-10-CM | POA: Diagnosis not present

## 2024-07-09 DIAGNOSIS — D3131 Benign neoplasm of right choroid: Secondary | ICD-10-CM | POA: Diagnosis not present

## 2024-07-09 DIAGNOSIS — H43813 Vitreous degeneration, bilateral: Secondary | ICD-10-CM | POA: Diagnosis not present

## 2024-07-10 DIAGNOSIS — G4733 Obstructive sleep apnea (adult) (pediatric): Secondary | ICD-10-CM | POA: Diagnosis not present

## 2024-07-26 DIAGNOSIS — L570 Actinic keratosis: Secondary | ICD-10-CM | POA: Diagnosis not present

## 2024-07-26 DIAGNOSIS — L309 Dermatitis, unspecified: Secondary | ICD-10-CM | POA: Diagnosis not present

## 2024-07-30 ENCOUNTER — Ambulatory Visit: Attending: Sleep Medicine

## 2024-07-30 DIAGNOSIS — G4733 Obstructive sleep apnea (adult) (pediatric): Secondary | ICD-10-CM | POA: Diagnosis not present

## 2024-07-31 DIAGNOSIS — G4733 Obstructive sleep apnea (adult) (pediatric): Secondary | ICD-10-CM | POA: Diagnosis not present

## 2024-08-09 ENCOUNTER — Ambulatory Visit (INDEPENDENT_AMBULATORY_CARE_PROVIDER_SITE_OTHER): Payer: Self-pay | Admitting: Sleep Medicine

## 2024-08-21 ENCOUNTER — Emergency Department
Admission: EM | Admit: 2024-08-21 | Discharge: 2024-08-21 | Disposition: A | Discharge status: Home / Self Care | Attending: Emergency Medicine | Admitting: Emergency Medicine

## 2024-08-21 ENCOUNTER — Emergency Department

## 2024-08-21 ENCOUNTER — Other Ambulatory Visit: Payer: Self-pay

## 2024-08-21 DIAGNOSIS — X501XXA Overexertion from prolonged static or awkward postures, initial encounter: Secondary | ICD-10-CM | POA: Insufficient documentation

## 2024-08-21 DIAGNOSIS — M549 Dorsalgia, unspecified: Secondary | ICD-10-CM | POA: Insufficient documentation

## 2024-08-21 DIAGNOSIS — I10 Essential (primary) hypertension: Secondary | ICD-10-CM | POA: Diagnosis not present

## 2024-08-21 DIAGNOSIS — S39012A Strain of muscle, fascia and tendon of lower back, initial encounter: Secondary | ICD-10-CM | POA: Diagnosis not present

## 2024-08-21 DIAGNOSIS — R0781 Pleurodynia: Secondary | ICD-10-CM | POA: Insufficient documentation

## 2024-08-21 DIAGNOSIS — T148XXA Other injury of unspecified body region, initial encounter: Secondary | ICD-10-CM

## 2024-08-21 MED ORDER — LIDOCAINE 5 % EX PTCH
1.0000 | MEDICATED_PATCH | CUTANEOUS | 0 refills | Status: AC
Start: 2024-08-21 — End: 2024-08-31

## 2024-08-21 MED ORDER — LIDOCAINE 5 % EX PTCH
1.0000 | MEDICATED_PATCH | CUTANEOUS | Status: DC
  Administered 2024-08-21: 1 via TRANSDERMAL
  Filled 2024-08-21: qty 1

## 2024-08-21 NOTE — ED Provider Notes (Signed)
 [facility] Provider Note    Event Date/Time   First MD Initiated Contact with Patient 08/21/24 2122     (approximate)   History   Back Pain   HPI  Brian Pearson is a 88 y.o. male with PMH of hypertension who presents for evaluation of back pain.  Patient reports he has had some back pain over the weekend but that it got worse today.  Reports that it is on his left side of his ribs.  Patient's wife believes it was exacerbated by him giving her dog a bath today as well as reaching to pick some things up underneath her bed.  Patient reports that he only has pain with very specific movements.  He does have some pain when taking a very deep breath and reports that as sharp to the left side of his ribs.  He does not feel short of breath.  No changes in bladder or bowel function, no numbness or tingling down the legs.  No fevers.      Physical Exam   Triage Vital Signs: ED Triage Vitals  Encounter Vitals Group     BP 08/21/24 2105 (!) 170/87     Girls Systolic BP Percentile --      Girls Diastolic BP Percentile --      Boys Systolic BP Percentile --      Boys Diastolic BP Percentile --      Pulse Rate 08/21/24 2105 75     Resp 08/21/24 2105 18     Temp 08/21/24 2105 98.4 F (36.9 C)     Temp src --      SpO2 08/21/24 2105 100 %     Weight 08/21/24 2104 220 lb (99.8 kg)     Height 08/21/24 2104 6' 1 (1.854 m)     Head Circumference --      Peak Flow --      Pain Score 08/21/24 2104 4     Pain Loc --      Pain Education --      Exclude from Growth Chart --     Most recent vital signs: Vitals:   08/21/24 2105  BP: (!) 170/87  Pulse: 75  Resp: 18  Temp: 98.4 F (36.9 C)  SpO2: 100%   General: Awake, no distress.  CV:  Good peripheral perfusion. RRR. Resp:  Normal effort.  CTAB. Abd:  No distention.  Other:  Mild tenderness to palpation over the left ribs   ED Results / Procedures / Treatments   Labs (all labs ordered are listed,  but only abnormal results are displayed) Labs Reviewed - No data to display   RADIOLOGY  Left rib and chest x-ray obtained, interpreted the images as well as reviewed the radiologist report which was negative for any acute abnormalities.  PROCEDURES:  Critical Care performed: No  Procedures   MEDICATIONS ORDERED IN ED: Medications - No data to display   IMPRESSION / MDM / ASSESSMENT AND PLAN / ED COURSE  I reviewed the triage vital signs and the nursing notes.                             88 year old male presents for evaluation of left sided rib and back pain.  Blood pressure is elevated the patient has history of hypertension and is uncomfortable on exam.  Vital signs stable otherwise.  Differential diagnosis includes, but is not limited to, muscle strain,  costochondritis, rib fracture, lumbar radiculopathy.  Patient's presentation is most consistent with acute complicated illness / injury requiring diagnostic workup.  Left rib x-ray is negative.  Physical exam is reassuring.  Suspect patient's pain is due to a muscle strain as it is associated with specific movements.  Offered pain medication while in the emergency department and patient declined.  Recommended pain control using Tylenol , ibuprofen and topical pain relievers like lidocaine  patches, muscle creams and ice and heat.  Reviewed return precautions.  Advised him to follow-up with his primary care provider and orthopedics if his pain persists.  Patient voiced understanding, all questions were answered and he was stable at discharge.      FINAL CLINICAL IMPRESSION(S) / ED DIAGNOSES   Final diagnoses:  Muscle strain     Rx / DC Orders   ED Discharge Orders          Ordered    lidocaine  (LIDODERM ) 5 %  Every 24 hours        08/21/24 2248             Note:  This document was prepared using Dragon voice recognition software and may include unintentional dictation errors.   Cleaster Tinnie LABOR,  PA-C 08/21/24 2249    Waymond Lorelle Cummins, MD 08/21/24 2252

## 2024-08-21 NOTE — Discharge Instructions (Signed)
 The x-ray of your chest and left side of your ribs was negative for fractures.  I believe your pain is a result of a muscle strain.  You can take 650 mg of Tylenol  and 600 mg of ibuprofen every 6 hours as needed for pain. You can use ice, heat, muscle creams and other topical pain relievers as well.  I have sent a prescription for lidocaine  patches.  Please follow-up with your primary care provider or the orthopedic provider whose information is attached if your pain persists.

## 2024-08-21 NOTE — ED Triage Notes (Signed)
 Pt reports lower back pain that radiates up into his mid back, pt denies fall or injury. Pt repots it started after he stretched reaching for something on the floor.

## 2024-09-28 DIAGNOSIS — G4733 Obstructive sleep apnea (adult) (pediatric): Secondary | ICD-10-CM | POA: Diagnosis not present

## 2024-10-19 ENCOUNTER — Ambulatory Visit (INDEPENDENT_AMBULATORY_CARE_PROVIDER_SITE_OTHER): Payer: HMO | Admitting: *Deleted

## 2024-10-19 VITALS — Ht 72.0 in | Wt 220.0 lb

## 2024-10-19 DIAGNOSIS — Z0000 Encounter for general adult medical examination without abnormal findings: Secondary | ICD-10-CM | POA: Diagnosis not present

## 2024-10-19 NOTE — Progress Notes (Signed)
 Subjective:   Brian Pearson is a 88 y.o. who presents for a Medicare Wellness preventive visit.  As a reminder, Annual Wellness Visits don't include a physical exam, and some assessments may be limited, especially if this visit is performed virtually. We may recommend an in-person follow-up visit with your provider if needed.  Visit Complete: Virtual I connected with  Brian Pearson on 10/19/24 by a audio enabled telemedicine application and verified that I am speaking with the correct person using two identifiers.  Patient Location: Home  Provider Location: Home Office  I discussed the limitations of evaluation and management by telemedicine. The patient expressed understanding and agreed to proceed.  Vital Signs: Because this visit was a virtual/telehealth visit, some criteria may be missing or patient reported. Any vitals not documented were not able to be obtained and vitals that have been documented are patient reported.  VideoDeclined- This patient declined Librarian, academic. Therefore the visit was completed with audio only.  Persons Participating in Visit: Patient.  AWV Questionnaire: No: Patient Medicare AWV questionnaire was not completed prior to this visit.  Cardiac Risk Factors include: advanced age (>59men, >86 women);male gender;dyslipidemia;hypertension     Objective:    Today's Vitals   10/19/24 0951  Weight: 220 lb (99.8 kg)  Height: 6' (1.829 m)   Body mass index is 29.84 kg/m.     10/19/2024   10:04 AM 08/21/2024    9:04 PM 10/14/2023   10:45 AM 10/11/2022    9:32 AM 03/10/2022    2:51 PM 10/08/2021    9:11 AM 04/13/2021    9:29 AM  Advanced Directives  Does Patient Have a Medical Advance Directive? Yes No Yes Yes Yes Yes   Type of Estate Agent of [place];Living will  Healthcare Power of [place];Living will Healthcare Power of [place];Living will  Healthcare Power of [place];Living will Living  will;Healthcare Power of Attorney  Does patient want to make changes to medical advance directive?   No - Patient declined No - Patient declined No - Patient declined No - Patient declined   Copy of Healthcare Power of Attorney in Chart? No - copy requested  Yes - validated most recent copy scanned in chart (See row information) No - copy requested  No - copy requested     Current Medications (verified) Outpatient Encounter Medications as of 10/19/2024  Medication Sig   amLODipine  (NORVASC ) 10 MG tablet Take 1 tablet (10 mg total) by mouth daily.   Coenzyme Q10 (COQ10 PO) Take 1 capsule by mouth daily.   Cyanocobalamin  5000 MCG SUBL Place 5,000 mcg under the tongue daily.   finasteride  (PROSCAR ) 5 MG tablet Take 1 tablet (5 mg total) by mouth daily.   losartan  (COZAAR ) 50 MG tablet Take 1 tablet (50 mg total) by mouth daily.   Multiple Vitamin (MULTI-VITAMINS) TABS Take 1 tablet by mouth daily.   Omega-3 Fatty Acids (FISH OIL PO) Take 1 capsule by mouth daily.   omeprazole  (PRILOSEC) 20 MG capsule Take 1 capsule (20 mg total) by mouth daily. (Patient taking differently: Take 20 mg by mouth daily as needed.)   oxybutynin  (DITROPAN  XL) 15 MG 24 hr tablet Take 1 tablet (15 mg total) by mouth daily.   simvastatin  (ZOCOR ) 20 MG tablet TAKE ONE TABLET BY MOUTH AT BEDTIME. APPOINTMENT NEEDED FOR FUTURE REFILLS.   triamcinolone  cream (KENALOG ) 0.1 % Apply topically 2 (two) times daily as needed.   No facility-administered encounter medications on file as  of 10/19/2024.    Allergies (verified) Oxycodone  and Amoxicillin   History: Past Medical History:  Diagnosis Date   Arthritis    BPH with obstruction/lower urinary tract symptoms    GERD (gastroesophageal reflux disease)    Hyperlipidemia    taken off simvastatin  last year   Hypertension    Obesity    Pneumonia    hospitalized    Sleep apnea    uses cpap machine    Urinary frequency    Urine incontinence    Past Surgical History:   Procedure Laterality Date   COLONOSCOPY WITH PROPOFOL  N/A 02/09/2016   Procedure: COLONOSCOPY WITH PROPOFOL ;  Surgeon: Deward CINDERELLA Piedmont, MD;  Location: [facility] ENDOSCOPY;  Service: Gastroenterology;  Laterality: N/A;   ESOPHAGOGASTRODUODENOSCOPY (EGD) WITH PROPOFOL  N/A 02/09/2016   Procedure: ESOPHAGOGASTRODUODENOSCOPY (EGD) WITH PROPOFOL ;  Surgeon: Deward CINDERELLA Piedmont, MD;  Location: [facility] ENDOSCOPY;  Service: Gastroenterology;  Laterality: N/A;   HERNIA REPAIR     1990's - 3 surgeries   KNEE ARTHROPLASTY Left 04/01/2021   Procedure: COMPUTER ASSISTED TOTAL KNEE ARTHROPLASTY;  Surgeon: Mardee Lynwood SQUIBB, MD;  Location: [facility] ORS;  Service: Orthopedics;  Laterality: Left;   Family History  Problem Relation Age of Onset   Arthritis Mother    Heart disease Mother        CHF   Cancer Father        Prostate   Dementia Father    Alzheimer's disease Father    Hypotension Sister    Kidney cancer Neg Hx    Bladder Cancer Neg Hx    Social History   Socioeconomic History   Marital status: Married    Spouse name: Not on file   Number of children: Not on file   Years of education: Not on file   Highest education level: Not on file  Occupational History   Not on file  Tobacco Use   Smoking status: Never   Smokeless tobacco: Never  Vaping Use   Vaping status: Never Used  Substance and Sexual Activity   Alcohol use: Yes    Alcohol/week: 0.0 standard drinks of alcohol    Comment: rarely    Drug use: No   Sexual activity: Yes  Other Topics Concern   Not on file  Social History Narrative   Lives in [place].      Work - retired, works part time driving for [employer] - golf in past      Exercise - stationary bike      Diet - regular      Served in [employer]   Social Drivers of [employer]: Low Risk  (10/19/2024)   Overall Financial Resource Strain (CARDIA)    Difficulty of Paying Living Expenses: Not hard at all  Food Insecurity: No Food  Insecurity (10/19/2024)   Hunger Vital Sign    Worried About Running Out of Food in the Last Year: Never true    Ran Out of Food in the Last Year: Never true  Transportation Needs: No Transportation Needs (10/19/2024)   PRAPARE - Administrator, Civil Service (Medical): No    Lack of Transportation (Non-Medical): No  Physical Activity: Insufficiently Active (10/19/2024)   Exercise Vital Sign    Days of Exercise per Week: 4 days    Minutes of Exercise per Session: 30 min  Stress: No Stress Concern Present (10/19/2024)   [employer] of Occupational Health - Occupational Stress  Questionnaire    Feeling of Stress: Not at all  Social Connections: Socially Integrated (10/19/2024)   Social Connection and Isolation Panel    Frequency of Communication with Friends and Family: More than three times a week    Frequency of Social Gatherings with Friends and Family: Once a week    Attends Religious Services: More than 4 times per year    Active Member of [employer] or Organizations: Yes    Attends Engineer, Structural: More than 4 times per year    Marital Status: Married    Tobacco Counseling Counseling given: Not Answered    Clinical Intake:  Pre-visit preparation completed: Yes  Pain : No/denies pain     BMI - recorded: 29.84 Nutritional Status: BMI 25 -29 Overweight Nutritional Risks: None Diabetes: No  Lab Results  Component Value Date   HGBA1C 6.1 06/26/2024   HGBA1C 6.1 02/20/2024   HGBA1C 6.1 10/20/2023     How often do you need to have someone help you when you read instructions, pamphlets, or other written materials from your doctor or pharmacy?: 1 - Never  Interpreter Needed?: No  Information entered by :: R. [place] LPN   Activities of Daily Living     10/19/2024    9:53 AM  In your present state of health, do you have any difficulty performing the following activities:  Hearing? 1  Vision? 0  Difficulty concentrating or making  decisions? 0  Walking or climbing stairs? 0  Dressing or bathing? 0  Doing errands, shopping? 0  Preparing Food and eating ? N  Using the Toilet? N  In the past six months, have you accidently leaked urine? Y  Do you have problems with loss of bowel control? N  Managing your Medications? N  Managing your Finances? N  Housekeeping or managing your Housekeeping? N    Patient Care Team: Glendia Shad, MD as PCP - General (Internal Medicine) Perla Evalene PARAS, MD as PCP - Cardiology (Cardiology) Helon Clotilda LABOR PA-C as Physician Assistant (Urology) Kasa, Kurian, MD as Consulting Physician (Pulmonary Disease)  I have updated your Care Teams any recent Medical Services you may have received from other providers in the past year.     Assessment:   This is a routine wellness examination for Brian Pearson.  Hearing/Vision screen Hearing Screening - Comments:: Wears aids Vision Screening - Comments:: glasses   Goals Addressed             This Visit's Progress    Patient Stated       Wants to continue to exercise and stay active       Depression Screen     10/19/2024    9:59 AM 10/14/2023   10:40 AM 06/20/2023   10:30 AM 11/17/2022   12:07 PM 10/11/2022    9:29 AM 09/15/2022    8:58 AM 05/07/2022   10:06 AM  PHQ 2/9 Scores  PHQ - 2 Score 0 0 0 0 0 0 0  PHQ- 9 Score 0 0 0        Fall Risk     10/19/2024    9:55 AM 10/14/2023   10:35 AM 06/20/2023   10:30 AM 11/17/2022   12:07 PM 10/11/2022    9:28 AM  Fall Risk   Falls in the past year? 0 0 0 0 0  Number falls in past yr: 0 0 0 0 0  Injury with Fall? 0 0 0 0 0  Risk for fall due to :  No Fall Risks No Fall Risks No Fall Risks No Fall Risks No Fall Risks  Follow up Falls evaluation completed;Falls prevention discussed Falls prevention discussed;Falls evaluation completed Falls evaluation completed Falls evaluation completed  Falls evaluation completed      Data saved with a previous flowsheet row definition     MEDICARE RISK AT HOME:  Medicare Risk at Home Any stairs in or around the home?: No If so, are there any without handrails?: No Home free of loose throw rugs in walkways, pet beds, electrical cords, etc?: Yes Adequate lighting in your home to reduce risk of falls?: Yes Life alert?: No Use of a cane, walker or w/c?: No Grab bars in the bathroom?: Yes Shower chair or bench in shower?: No Elevated toilet seat or a handicapped toilet?: Yes  TIMED UP AND GO:  Was the test performed?  No  Cognitive Function: 6CIT completed    08/30/2016    9:00 AM 08/28/2015    9:09 AM  MMSE - Mini Mental State Exam  Orientation to time 5  5   Orientation to Place 5  5   Registration 3  3   Attention/ Calculation 5  5   Recall 3  3   Language- name 2 objects 2  2   Language- repeat 1 1  Language- follow 3 step command 3  3   Language- read & follow direction 1  1   Write a sentence 1  1   Copy design 1  1   Total score 30  30      Data saved with a previous flowsheet row definition        10/19/2024   10:05 AM 10/14/2023   10:47 AM 10/11/2022    9:33 AM 10/08/2021    9:19 AM 10/07/2020    9:19 AM  6CIT Screen  What Year? 0 points 0 points 0 points 0 points 0 points  What month? 0 points 0 points 0 points 0 points 0 points  What time? 0 points 0 points 0 points 0 points   Count back from 20 0 points 0 points 0 points 0 points   Months in reverse 0 points 0 points 0 points 0 points 0 points  Repeat phrase 0 points 0 points 0 points 0 points 0 points  Total Score 0 points 0 points 0 points 0 points     Immunizations Immunization History  Administered Date(s) Administered   Fluad Quad(high Dose 65+) 10/05/2019, 09/19/2020, 09/15/2022   INFLUENZA, HIGH DOSE SEASONAL PF 08/28/2015, 08/30/2016, 08/30/2017, 08/31/2018, 09/29/2023   Influenza-Unspecified 09/21/2014, 10/20/2021   PFIZER(Purple Top)SARS-COV-2 Vaccination 01/17/2020, 02/11/2020   Pneumococcal Conjugate-13 10/22/2014    Pneumococcal-Unspecified 10/23/2011   Tdap 08/28/2015    Screening Tests Health Maintenance  Topic Date Due   Zoster Vaccines- Shingrix (1 of 2) Never done   COVID-19 Vaccine (3 - Pfizer risk series) 03/10/2020   Influenza Vaccine  07/20/2024   Medicare Annual Wellness (AWV)  10/13/2024   Pneumococcal Vaccine: 50+ Years (2 of 2 - PCV20 or PCV21) 06/28/2025 (Originally 10/23/2015)   DTaP/Tdap/Td (2 - Td or Tdap) 08/27/2025   Meningococcal B Vaccine  Aged Out    Health Maintenance Items Addressed: Discussed the need to update shingles vaccines.  Additional Screening:  Vision Screening: Recommended annual ophthalmology exams for early detection of glaucoma and other disorders of the eye. Is the patient up to date with their annual eye exam?  Yes  Who is the provider or what is the  name of the office in which the patient attends annual eye exams?  [facility]  Dental Screening: Recommended annual dental exams for proper oral hygiene  Community Resource Referral / Chronic Care Management: CRR required this visit?  No   CCM required this visit?  No   Plan:    I have personally reviewed and noted the following in the patient's chart:   Medical and social history Use of alcohol, tobacco or illicit drugs  Current medications and supplements including opioid prescriptions. Patient is not currently taking opioid prescriptions. Functional ability and status Nutritional status Physical activity Advanced directives List of other physicians Hospitalizations, surgeries, and ER visits in previous 12 months Vitals Screenings to include cognitive, depression, and falls Referrals and appointments  In addition, I have reviewed and discussed with patient certain preventive protocols, quality metrics, and best practice recommendations. A written personalized care plan for preventive services as well as general preventive health recommendations were provided to patient.   Angeline Fredericks,  LPN   [id number]   After Visit Summary: (Pick Up) Due to this being a telephonic visit, with patients personalized plan was offered to patient and patient has requested to Pick up at office.  Notes: Nothing significant to report at this time.

## 2024-10-19 NOTE — Patient Instructions (Signed)
 Brian Pearson,  Thank you for taking the time for your Medicare Wellness Visit. I appreciate your continued commitment to your health goals. Please review the care plan we discussed, and feel free to reach out if I can assist you further.  Medicare recommends these wellness visits once per year to help you and your care team stay ahead of potential health issues. These visits are designed to focus on prevention, allowing your provider to concentrate on managing your acute and chronic conditions during your regular appointments.  Please note that Annual Wellness Visits do not include a physical exam. Some assessments may be limited, especially if the visit was conducted virtually. If needed, we may recommend a separate in-person follow-up with your provider.  Ongoing Care Seeing your primary care provider every 3 to 6 months helps us  monitor your health and provide consistent, personalized care.  Consider updating your shingles vaccines.  Referrals If a referral was made during today's visit and you haven't received any updates within two weeks, please contact the referred provider directly to check on the status.  Recommended Screenings:  Health Maintenance  Topic Date Due   Zoster (Shingles) Vaccine (1 of 2) Never done   COVID-19 Vaccine (3 - Pfizer risk series) 03/10/2020   Pneumococcal Vaccine for age over 3 (2 of 2 - PCV20 or PCV21) 06/28/2025*   DTaP/Tdap/Td vaccine (2 - Td or Tdap) 08/27/2025   Medicare Annual Wellness Visit  10/19/2025   Flu Shot  Completed   Meningitis B Vaccine  Aged Out  *Topic was postponed. The date shown is not the original due date.       10/19/2024   10:04 AM  Advanced Directives  Does Patient Have a Medical Advance Directive? Yes  Type of Estate Agent of [place];Living will  Copy of Healthcare Power of Attorney in Chart? No - copy requested   Advance Care Planning is important because it: Ensures you receive medical care  that aligns with your values, goals, and preferences. Provides guidance to your family and loved ones, reducing the emotional burden of decision-making during critical moments.  Vision: Annual vision screenings are recommended for early detection of glaucoma, cataracts, and diabetic retinopathy. These exams can also reveal signs of chronic conditions such as diabetes and high blood pressure.  Dental: Annual dental screenings help detect early signs of oral cancer, gum disease, and other conditions linked to overall health, including heart disease and diabetes.  Please see the attached documents for additional preventive care recommendations.

## 2024-10-26 ENCOUNTER — Encounter: Payer: Self-pay | Admitting: Internal Medicine

## 2024-10-26 ENCOUNTER — Ambulatory Visit: Admitting: Internal Medicine

## 2024-10-26 VITALS — BP 120/80 | HR 70 | Temp 98.0°F | Ht 72.0 in | Wt 217.8 lb

## 2024-10-26 DIAGNOSIS — Z461 Encounter for fitting and adjustment of hearing aid: Secondary | ICD-10-CM | POA: Insufficient documentation

## 2024-10-26 DIAGNOSIS — G4733 Obstructive sleep apnea (adult) (pediatric): Secondary | ICD-10-CM

## 2024-10-26 NOTE — Patient Instructions (Signed)
 Excellent Job A+ GOLD STAR!!  Plan to change to BiPAP 19/15   Patient Instructions Continue to use CPAP every night, minimum of 4-6 hours a night.  Change equipment every 30 days or as directed by DME.  Wash your tubing with warm soap and water daily, hang to dry. Wash humidifier portion weekly. Use bottled, distilled water and change daily   Be aware of reduced alertness and do not drive or operate heavy machinery if experiencing this or drowsiness.  Exercise encouraged, as tolerated. Encouraged proper weight management.  Important to get eight or more hours of sleep  Limiting the use of the computer and television before bedtime.  Decrease naps during the day, so night time sleep will become enhanced.  Limit caffeine, and sleep deprivation.    Avoid Allergens and Irritants Avoid secondhand smoke Avoid SICK contacts Recommend  Masking  when appropriate Recommend Keep up-to-date with vaccinations

## 2024-10-26 NOTE — Progress Notes (Signed)
 Name: Brian Pearson MRN: [id number] DOB: October 01, 1936     CONSULTATION DATE: 10/26/2024  REFERRING MD :Brian Pearson   SYNOPSIS ESTABLISHED CARE FOR SEVERE SLEEP APNEA Patient diagnosed with severe sleep apnea AHI of 40 178 desaturation episodes 300 total episodes of apnea and hypopnea    CHIEF COMPLAINT:   Follow up assessment for severe OSA   HISTORY OF PRESENT ILLNESS:  Discussed sleep data and reviewed with patient.  Encouraged proper weight management.  Discussed driving precautions and its relationship with hypersomnolence.  Discussed sleep hygiene, and benefits of Pearson fixed sleep waked time.  The importance of getting eight or more hours of sleep discussed with patient.  Discussed limiting the use of the computer and television before bedtime.  Decrease naps during the day, so night time sleep will become enhanced.  Limit caffeine, and sleep deprivation.   Patient uses and benefits from therapy Using CPAP nightly and with naps Pressure setting is comfortable and is sleeping well.  100% compliance for  30 days and greater than 4 hours Previous auto CPAP 4-10 Changed to auto CPAP 4-16 magnetic ResMed AirFit F40 mask AHI remains elevated at 18 In-lab CPAP titration study was performed recommendations include BiPAP 19/15 blood pressure support of 5  Plan to follow-up in 3 months for further assessment  More energy less fatigue No exacerbation at this time No evidence of heart failure at this time No evidence or signs of infection at this time No respiratory distress No fevers, chills, nausea, vomiting, diarrhea No evidence of lower extremity edema No evidence hemoptysis        PAST MEDICAL HISTORY :   has Pearson past medical history of Arthritis, BPH with obstruction/lower urinary tract symptoms, GERD (gastroesophageal reflux disease), Hyperlipidemia, Hypertension, Obesity, Pneumonia, Sleep apnea, Urinary frequency, and Urine incontinence.  has Pearson past  surgical history that includes Hernia repair; Colonoscopy with propofol  (N/Pearson, 02/09/2016); Esophagogastroduodenoscopy (egd) with propofol  (N/Pearson, 02/09/2016); and Knee Arthroplasty (Left, 04/01/2021). Prior to Admission medications   Medication Sig Start Date End Date Taking? Authorizing Provider  amLODipine  (NORVASC ) 2.5 MG tablet TAKE 1 TABLET BY MOUTH ONCE DAILY 12/18/18   Brian Shad, MD  aspirin EC 81 MG tablet Take by mouth.    [provider]  Cholecalciferol  (VITAMIN D-3) 1000 units CAPS Take 1 capsule by mouth.    [provider]  Coenzyme Q10 (COQ10 PO) Take 1 capsule by mouth daily.    [provider]  fesoterodine  (TOVIAZ ) 4 MG TB24 tablet Take 1 tablet (4 mg total) by mouth daily. 05/22/18   Brian Pearson  finasteride  (PROSCAR ) 5 MG tablet Take 1 tablet (5 mg total) by mouth daily. 05/01/19   Brian Pearson  Multiple Vitamin (MULTI-VITAMINS) TABS Take by mouth.    [provider]  Omega-3 Fatty Acids (FISH OIL PO) Take by mouth.    [provider]  omeprazole  (PRILOSEC) 20 MG capsule TAKE 1 CAPSULE BY MOUTH ONCE DAILY 11/14/18   Brian Shad, MD  simvastatin  (ZOCOR ) 20 MG tablet TAKE 1 TABLET BY MOUTH AT BEDTIME NEEDS  APPOINTMENT  FOR  ADDITIONAL  OR  90  DAY  REFILLS 04/04/19   Brian Shad, MD  tamsulosin  (FLOMAX ) 0.4 MG CAPS capsule Take 1 capsule (0.4 mg total) by mouth daily. 05/01/19   Brian Pearson   Allergies  Allergen Reactions   Oxycodone  Other (See Comments)    Patient felt as if he was burning on the insides and refuses  to take again   Amoxicillin Rash   SOCIAL HISTORY:  reports that he has never smoked. He has never used smokeless tobacco. He reports current alcohol use. He reports that he does not use drugs.   BP 120/80   Pulse 70   Temp 98 F (36.7 C)   Ht 6' (1.829 m)   Wt 217 lb 12.8 oz (98.8 kg)   SpO2 96%   BMI 29.54 kg/m    Physical Examination:  General Appearance: No  distress  EYES EOM intact.   NECK Supple, No JVD Pulmonary: normal breath sounds, No wheezing.  CardiovascularNormal S1,S2.  No m/r/g.   Ext pulses intact, cap refill intact  ALL OTHER ROS ARE NEGATIVE     ASSESSMENT AND PLAN SYNOPSIS 88 year old pleasant white male seen today for underlying severe sleep apnea  SEVERE OSA Assessment of OSA Previous AHI 40 Plan to change to BiPAP 19/15 Otherwise Excellent compliance report Pressure setting is comfortable and is sleeping well. CPAP prescription 4-16 AHI reduced to 18 in-lab CPAP titration study-adjust to BiPAP 19/15  No evidence of acute heart failure at this time No respiratory distress No fevers, chills, nausea, vomiting, diarrhea No evidence hemoptysis  Patient Instructions Continue to use CPAP every night, minimum of 4-6 hours Pearson night.  Change equipment every 30 days or as directed by DME.  Wash your tubing with warm soap and water daily, hang to dry. Wash humidifier portion weekly. Use bottled, distilled water and change daily    Hypertension - Sleep apnea can contribute to hypertension, therefore treatment of sleep apnea is important part of hypertension management.      MEDICATION ADJUSTMENTS/LABS AND TESTS ORDERED: Change to BiPAP settings 19/15 Avoid Allergens and Irritants Avoid secondhand smoke Avoid SICK contacts Recommend  Masking  when appropriate Recommend Keep up-to-date with vaccinations   CURRENT MEDICATIONS REVIEWED AT LENGTH WITH PATIENT TODAY   Patient  satisfied with Plan of action and management. All questions answered   Follow up 3 months for further assessment   I spent Pearson total of 42 minutes dedicated to the care of this patient on the date of this encounter to include pre-visit review of records, face-to-face time with the patient discussing conditions above, post visit ordering of testing, clinical documentation with the electronic health record, making appropriate referrals as  documented, and communicating necessary information to the patient's healthcare team.    The Patient requires high complexity decision making for assessment and support, frequent evaluation and titration of therapies, application of advanced monitoring technologies and extensive interpretation of multiple databases.  Patient satisfied with Plan of action and management. All questions answered    Nickolas Alm Cellar, M.D.  [facility] Pulmonary & Critical Care Medicine  Medical Director [facility] [place]

## 2024-10-30 ENCOUNTER — Other Ambulatory Visit

## 2024-10-30 DIAGNOSIS — I10 Essential (primary) hypertension: Secondary | ICD-10-CM

## 2024-10-30 DIAGNOSIS — R739 Hyperglycemia, unspecified: Secondary | ICD-10-CM | POA: Diagnosis not present

## 2024-10-30 DIAGNOSIS — E785 Hyperlipidemia, unspecified: Secondary | ICD-10-CM | POA: Diagnosis not present

## 2024-10-30 LAB — LIPID PANEL
Cholesterol: 138 mg/dL (ref 0–200)
HDL: 56.4 mg/dL (ref >39.00)
LDL Cholesterol: 64 mg/dL (ref 0–99)
NonHDL: 81.95
Total CHOL/HDL Ratio: 2
Triglycerides: 88 mg/dL (ref 0.0–149.0)
VLDL: 17.6 mg/dL (ref 0.0–40.0)

## 2024-10-30 LAB — BASIC METABOLIC PANEL WITH GFR
BUN: 18 mg/dL (ref 6–23)
CO2: 28 meq/L (ref 19–32)
Calcium: 9 mg/dL (ref 8.4–10.5)
Chloride: 105 meq/L (ref 96–112)
Creatinine, Ser: 0.88 mg/dL (ref 0.40–1.50)
GFR: 77.8 mL/min (ref >60.00)
Glucose, Bld: 87 mg/dL (ref 70–99)
Potassium: 4.3 meq/L (ref 3.5–5.1)
Sodium: 141 meq/L (ref 135–145)

## 2024-10-30 LAB — HEPATIC FUNCTION PANEL
ALT: 13 U/L (ref 0–53)
AST: 17 U/L (ref 0–37)
Albumin: 4.5 g/dL (ref 3.5–5.2)
Alkaline Phosphatase: 74 U/L (ref 39–117)
Bilirubin, Direct: 0.1 mg/dL (ref 0.0–0.3)
Total Bilirubin: 0.7 mg/dL (ref 0.2–1.2)
Total Protein: 6.6 g/dL (ref 6.0–8.3)

## 2024-10-30 LAB — HEMOGLOBIN A1C: Hgb A1c MFr Bld: 5.9 % (ref 4.6–6.5)

## 2024-10-31 ENCOUNTER — Ambulatory Visit: Payer: Self-pay | Admitting: Internal Medicine

## 2024-11-01 ENCOUNTER — Encounter: Payer: Self-pay | Admitting: Internal Medicine

## 2024-11-01 ENCOUNTER — Ambulatory Visit: Admitting: Internal Medicine

## 2024-11-01 VITALS — BP 126/58 | HR 69 | Temp 97.5°F | Ht 72.0 in | Wt 209.0 lb

## 2024-11-01 DIAGNOSIS — N3941 Urge incontinence: Secondary | ICD-10-CM | POA: Diagnosis not present

## 2024-11-01 DIAGNOSIS — R739 Hyperglycemia, unspecified: Secondary | ICD-10-CM | POA: Diagnosis not present

## 2024-11-01 DIAGNOSIS — I10 Essential (primary) hypertension: Secondary | ICD-10-CM | POA: Diagnosis not present

## 2024-11-01 DIAGNOSIS — G4733 Obstructive sleep apnea (adult) (pediatric): Secondary | ICD-10-CM | POA: Diagnosis not present

## 2024-11-01 DIAGNOSIS — E785 Hyperlipidemia, unspecified: Secondary | ICD-10-CM | POA: Diagnosis not present

## 2024-11-01 MED ORDER — AMLODIPINE BESYLATE 10 MG PO TABS: 10.0000 mg | ORAL_TABLET | Freq: Every day | ORAL | 2 refills | Status: AC

## 2024-11-01 MED ORDER — LOSARTAN POTASSIUM 50 MG PO TABS: 50.0000 mg | ORAL_TABLET | Freq: Every day | ORAL | 2 refills | Status: AC

## 2024-11-01 NOTE — Progress Notes (Signed)
 Subjective:    Patient ID: Brian Pearson, male    DOB: [birth year], 88 y.o.   MRN: [id number]  Patient here for  Chief Complaint  Patient presents with   Medical Management of Chronic Issues    HPI Here for a scheduled follow up - follow up regarding hypertension and hypercholesterolemia. Continues on losartan  and amlodipine . Reports blood pressure has been doing better. Had f/u with pulmonary 10/26/24  - continue cpap. Follow up with urology 05/09/24 - recommended to continue finasteride  5mg  q day and increase oxybutynin  to 15mg  daily. Tries to stay active. Active in his church. No chest pain. No sob reported. No abdominal pain or bowel change.    Past Medical History:  Diagnosis Date   Arthritis    BPH with obstruction/lower urinary tract symptoms    GERD (gastroesophageal reflux disease)    Hyperlipidemia    taken off simvastatin  last year   Hypertension    Obesity    Pneumonia    hospitalized    Sleep apnea    uses cpap machine    Urinary frequency    Urine incontinence    Past Surgical History:  Procedure Laterality Date   COLONOSCOPY WITH PROPOFOL  N/A 02/09/2016   Procedure: COLONOSCOPY WITH PROPOFOL ;  Surgeon: Deward CINDERELLA Piedmont, MD;  Location: [facility] ENDOSCOPY;  Service: Gastroenterology;  Laterality: N/A;   ESOPHAGOGASTRODUODENOSCOPY (EGD) WITH PROPOFOL  N/A 02/09/2016   Procedure: ESOPHAGOGASTRODUODENOSCOPY (EGD) WITH PROPOFOL ;  Surgeon: Deward CINDERELLA Piedmont, MD;  Location: [facility] ENDOSCOPY;  Service: Gastroenterology;  Laterality: N/A;   HERNIA REPAIR     1990's - 3 surgeries   KNEE ARTHROPLASTY Left 04/01/2021   Procedure: COMPUTER ASSISTED TOTAL KNEE ARTHROPLASTY;  Surgeon: Mardee Lynwood SQUIBB, MD;  Location: [facility] ORS;  Service: Orthopedics;  Laterality: Left;   Family History  Problem Relation Age of Onset   Arthritis Mother    Heart disease Mother        CHF   Cancer Father        Prostate   Dementia Father    Alzheimer's disease Father    Hypotension Sister    Kidney cancer Neg  Hx    Bladder Cancer Neg Hx    Social History   Socioeconomic History   Marital status: Married    Spouse name: Not on file   Number of children: Not on file   Years of education: Not on file   Highest education level: Not on file  Occupational History   Not on file  Tobacco Use   Smoking status: Never   Smokeless tobacco: Never  Vaping Use   Vaping status: Never Used  Substance and Sexual Activity   Alcohol use: Yes    Alcohol/week: 0.0 standard drinks of alcohol    Comment: rarely    Drug use: No   Sexual activity: Yes  Other Topics Concern   Not on file  Social History Narrative   Lives in [place].      Work - retired, works part time driving for [employer] - golf in past      Exercise - stationary bike      Diet - regular      Served in [employer]   Social Drivers of [employer]: Low Risk  (10/19/2024)   Overall Financial Resource Strain (CARDIA)    Difficulty of Paying Living Expenses: Not hard at all  Food Insecurity: No Food Insecurity (10/19/2024)   Hunger  Vital Sign    Worried About Programme Researcher, Broadcasting/film/video in the Last Year: Never true    Ran Out of Food in the Last Year: Never true  Transportation Needs: No Transportation Needs (10/19/2024)   PRAPARE - Administrator, Civil Service (Medical): No    Lack of Transportation (Non-Medical): No  Physical Activity: Insufficiently Active (10/19/2024)   Exercise Vital Sign    Days of Exercise per Week: 4 days    Minutes of Exercise per Session: 30 min  Stress: No Stress Concern Present (10/19/2024)   [employer] of Occupational Health - Occupational Stress Questionnaire    Feeling of Stress: Not at all  Social Connections: Socially Integrated (10/19/2024)   Social Connection and Isolation Panel    Frequency of Communication with Friends and Family: More than three times a week    Frequency of Social Gatherings with Friends and Family: Once a  week    Attends Religious Services: More than 4 times per year    Active Member of [employer] or Organizations: Yes    Attends Engineer, Structural: More than 4 times per year    Marital Status: Married     Review of Systems  Constitutional:  Negative for appetite change and unexpected weight change.  HENT:  Negative for congestion and sinus pressure.   Respiratory:  Negative for cough, chest tightness and shortness of breath.   Cardiovascular:  Negative for chest pain and palpitations.       No increased swelling.   Gastrointestinal:  Negative for abdominal pain, diarrhea, nausea and vomiting.  Genitourinary:  Negative for difficulty urinating and dysuria.  Musculoskeletal:  Negative for joint swelling and myalgias.  Skin:  Negative for color change and rash.  Neurological:  Negative for dizziness and headaches.  Psychiatric/Behavioral:  Negative for agitation and dysphoric mood.        Objective:     BP (!) 126/58   Pulse 69   Temp (!) 97.5 F (36.4 C) (Oral)   Ht 6' (1.829 m)   Wt 209 lb (94.8 kg)   SpO2 97%   BMI 28.35 kg/m  Wt Readings from Last 3 Encounters:  11/01/24 209 lb (94.8 kg)  10/26/24 217 lb 12.8 oz (98.8 kg)  10/19/24 220 lb (99.8 kg)    Physical Exam Vitals reviewed.  Constitutional:      General: He is not in acute distress.    Appearance: Normal appearance. He is well-developed.  HENT:     Head: Normocephalic and atraumatic.     Right Ear: External ear normal.     Left Ear: External ear normal.     Mouth/Throat:     Pharynx: No oropharyngeal exudate or posterior oropharyngeal erythema.  Eyes:     General: No scleral icterus.       Right eye: No discharge.        Left eye: No discharge.     Conjunctiva/sclera: Conjunctivae normal.  Cardiovascular:     Rate and Rhythm: Normal rate and regular rhythm.  Pulmonary:     Effort: Pulmonary effort is normal. No respiratory distress.     Breath sounds: Normal breath sounds.  Abdominal:      General: Bowel sounds are normal.     Palpations: Abdomen is soft.     Tenderness: There is no abdominal tenderness.  Musculoskeletal:        General: No swelling or tenderness.     Cervical back: Neck supple. No tenderness.  Lymphadenopathy:  Cervical: No cervical adenopathy.  Skin:    Findings: No erythema or rash.  Neurological:     Mental Status: He is alert.  Psychiatric:        Mood and Affect: Mood normal.        Behavior: Behavior normal.         Outpatient Encounter Medications as of 11/01/2024  Medication Sig   Coenzyme Q10 (COQ10 PO) Take 1 capsule by mouth daily.   Cyanocobalamin  5000 MCG SUBL Place 5,000 mcg under the tongue daily.   finasteride  (PROSCAR ) 5 MG tablet Take 1 tablet (5 mg total) by mouth daily.   Multiple Vitamin (MULTI-VITAMINS) TABS Take 1 tablet by mouth daily.   Omega-3 Fatty Acids (FISH OIL PO) Take 1 capsule by mouth daily.   omeprazole  (PRILOSEC) 20 MG capsule Take 1 capsule (20 mg total) by mouth daily. (Patient taking differently: Take 20 mg by mouth daily as needed.)   oxybutynin  (DITROPAN  XL) 15 MG 24 hr tablet Take 1 tablet (15 mg total) by mouth daily.   simvastatin  (ZOCOR ) 20 MG tablet TAKE ONE TABLET BY MOUTH AT BEDTIME. APPOINTMENT NEEDED FOR FUTURE REFILLS.   triamcinolone  cream (KENALOG ) 0.1 % Apply topically 2 (two) times daily as needed.   amLODipine  (NORVASC ) 10 MG tablet Take 1 tablet (10 mg total) by mouth daily.   losartan  (COZAAR ) 50 MG tablet Take 1 tablet (50 mg total) by mouth daily.   [DISCONTINUED] amLODipine  (NORVASC ) 10 MG tablet Take 1 tablet (10 mg total) by mouth daily.   [DISCONTINUED] losartan  (COZAAR ) 50 MG tablet Take 1 tablet (50 mg total) by mouth daily.   No facility-administered encounter medications on file as of 11/01/2024.     Lab Results  Component Value Date   WBC 5.5 06/26/2024   HGB 13.8 06/26/2024   HCT 41.7 06/26/2024   PLT 207.0 06/26/2024   GLUCOSE 87 10/30/2024   CHOL 138 10/30/2024    TRIG 88.0 10/30/2024   HDL 56.40 10/30/2024   LDLCALC 64 10/30/2024   ALT 13 10/30/2024   AST 17 10/30/2024   NA 141 10/30/2024   K 4.3 10/30/2024   CL 105 10/30/2024   CREATININE 0.88 10/30/2024   BUN 18 10/30/2024   CO2 28 10/30/2024   TSH 3.66 06/26/2024   PSA 1.24 06/26/2021   INR 1.0 03/23/2021   HGBA1C 5.9 10/30/2024    DG Ribs Unilateral W/Chest Left Result Date: 08/21/2024 CLINICAL DATA:  Left rib pain EXAM: LEFT RIBS AND CHEST - 3+ VIEW COMPARISON:  None Available. FINDINGS: No fracture or other bone lesions are seen involving the ribs. There is no evidence of pneumothorax or pleural effusion. Both lungs are clear. Heart size and mediastinal contours are within normal limits. IMPRESSION: Negative. Electronically Signed   By: Dorethia Molt M.D.   On: 08/21/2024 22:20       Assessment & Plan:  Urge incontinence Assessment & Plan: Follow up with urology 05/09/24 - recommended to continue finasteride  5mg  q day and increase oxybutynin  to 15mg  daily.   Essential hypertension Assessment & Plan: Continue losartan  and amlodipine .  Pressure as outlined.  No changes in medication today. Follow pressures. Follow metabolic panel.   Orders: -     Basic metabolic panel with GFR; Future  Hyperglycemia Assessment & Plan: Follow met b and A1c.   Lab Results  Component Value Date   HGBA1C 5.9 10/30/2024     Orders: -     Hemoglobin A1c; Future  Hyperlipidemia, unspecified hyperlipidemia type  Assessment & Plan: On simvastatin .  Low-cholesterol diet and exercise.  Follow lipid panel.  Lab Results  Component Value Date   CHOL 138 10/30/2024   HDL 56.40 10/30/2024   LDLCALC 64 10/30/2024   TRIG 88.0 10/30/2024   CHOLHDL 2 10/30/2024     Orders: -     Hepatic function panel; Future -     Lipid panel; Future  OSA on CPAP Assessment & Plan: Continue cpap.    Other orders -     amLODIPine  Besylate; Take 1 tablet (10 mg total) by mouth daily.  Dispense: 90 tablet;  Refill: 2 -     Losartan  Potassium; Take 1 tablet (50 mg total) by mouth daily.  Dispense: 90 tablet; Refill: 2     Allena Hamilton, MD

## 2024-11-05 ENCOUNTER — Encounter: Payer: Self-pay | Admitting: Internal Medicine

## 2024-11-05 NOTE — Assessment & Plan Note (Signed)
 On simvastatin .  Low-cholesterol diet and exercise.  Follow lipid panel.  Lab Results  Component Value Date   CHOL 138 10/30/2024   HDL 56.40 10/30/2024   LDLCALC 64 10/30/2024   TRIG 88.0 10/30/2024   CHOLHDL 2 10/30/2024

## 2024-11-05 NOTE — Assessment & Plan Note (Signed)
 Follow met b and A1c.   Lab Results  Component Value Date   HGBA1C 5.9 10/30/2024

## 2024-11-05 NOTE — Assessment & Plan Note (Signed)
 Continue losartan and amlodipine.  Pressure as outlined.  No changes in medication today.  Follow pressures.  Follow metabolic panel.

## 2024-11-05 NOTE — Assessment & Plan Note (Signed)
 Follow up with urology 05/09/24 - recommended to continue finasteride  5mg  q day and increase oxybutynin  to 15mg  daily.

## 2024-11-05 NOTE — Assessment & Plan Note (Signed)
 Continue cpap.

## 2025-01-29 ENCOUNTER — Ambulatory Visit: Admitting: Internal Medicine

## 2025-01-29 ENCOUNTER — Ambulatory Visit

## 2025-03-11 ENCOUNTER — Encounter: Admitting: Internal Medicine

## 2025-05-09 ENCOUNTER — Ambulatory Visit: Admitting: Urology
